# Patient Record
Sex: Female | Born: 1945 | Race: White | Hispanic: No
Health system: Southern US, Community
[De-identification: ages and names within clinical notes are randomized; demographics above are authoritative.]

## PROBLEM LIST (undated history)

## (undated) DIAGNOSIS — M159 Polyosteoarthritis, unspecified: Secondary | ICD-10-CM

## (undated) DIAGNOSIS — D519 Vitamin B12 deficiency anemia, unspecified: Secondary | ICD-10-CM

## (undated) DIAGNOSIS — K219 Gastro-esophageal reflux disease without esophagitis: Secondary | ICD-10-CM

## (undated) DIAGNOSIS — F419 Anxiety disorder, unspecified: Secondary | ICD-10-CM

## (undated) DIAGNOSIS — I4891 Unspecified atrial fibrillation: Secondary | ICD-10-CM

## (undated) DIAGNOSIS — IMO0001 Reserved for inherently not codable concepts without codable children: Secondary | ICD-10-CM

## (undated) DIAGNOSIS — M6281 Muscle weakness (generalized): Secondary | ICD-10-CM

## (undated) DIAGNOSIS — E559 Vitamin D deficiency, unspecified: Secondary | ICD-10-CM

## (undated) DIAGNOSIS — K5090 Crohn's disease, unspecified, without complications: Secondary | ICD-10-CM

## (undated) DIAGNOSIS — I10 Essential (primary) hypertension: Secondary | ICD-10-CM

## (undated) DIAGNOSIS — R296 Repeated falls: Secondary | ICD-10-CM

## (undated) DIAGNOSIS — G4700 Insomnia, unspecified: Secondary | ICD-10-CM

## (undated) DIAGNOSIS — M1990 Unspecified osteoarthritis, unspecified site: Secondary | ICD-10-CM

## (undated) DIAGNOSIS — F329 Major depressive disorder, single episode, unspecified: Secondary | ICD-10-CM

## (undated) DIAGNOSIS — R41841 Cognitive communication deficit: Secondary | ICD-10-CM

## (undated) DIAGNOSIS — D518 Other vitamin B12 deficiency anemias: Secondary | ICD-10-CM

## (undated) DIAGNOSIS — D649 Anemia, unspecified: Secondary | ICD-10-CM

## (undated) DIAGNOSIS — F32A Depression, unspecified: Secondary | ICD-10-CM

## (undated) DIAGNOSIS — R011 Cardiac murmur, unspecified: Secondary | ICD-10-CM

## (undated) DIAGNOSIS — R5382 Chronic fatigue, unspecified: Secondary | ICD-10-CM

## (undated) DIAGNOSIS — F0390 Unspecified dementia without behavioral disturbance: Secondary | ICD-10-CM

## (undated) HISTORY — DX: Anxiety disorder, unspecified: F41.9

## (undated) HISTORY — DX: Cardiac murmur, unspecified: R01.1

## (undated) HISTORY — DX: Gastro-esophageal reflux disease without esophagitis: K21.9

## (undated) HISTORY — PX: COLON SURGERY: SHX602

## (undated) HISTORY — DX: Anemia, unspecified: D64.9

## (undated) HISTORY — DX: Crohn's disease, unspecified, without complications: K50.90

## (undated) HISTORY — DX: Unspecified osteoarthritis, unspecified site: M19.90

## (undated) HISTORY — DX: Essential (primary) hypertension: I10

## (undated) HISTORY — PX: CHOLECYSTECTOMY: SHX55

## (undated) HISTORY — PX: CARPAL TUNNEL RELEASE: SHX101

## (undated) HISTORY — DX: Major depressive disorder, single episode, unspecified: F32.9

## (undated) HISTORY — DX: Reserved for inherently not codable concepts without codable children: IMO0001

## (undated) HISTORY — PX: ANKLE SURGERY: SHX546

## (undated) HISTORY — DX: Depression, unspecified: F32.A

---

## 1998-12-18 ENCOUNTER — Ambulatory Visit (HOSPITAL_COMMUNITY): Admission: RE | Admit: 1998-12-18 | Discharge: 1998-12-18 | Payer: Self-pay

## 2000-12-12 ENCOUNTER — Encounter: Admission: RE | Admit: 2000-12-12 | Discharge: 2000-12-12 | Payer: Self-pay | Admitting: *Deleted

## 2000-12-12 ENCOUNTER — Encounter: Payer: Self-pay | Admitting: *Deleted

## 2003-03-04 ENCOUNTER — Encounter: Admission: RE | Admit: 2003-03-04 | Discharge: 2003-03-04 | Payer: Self-pay | Admitting: Family Medicine

## 2003-07-31 ENCOUNTER — Encounter (INDEPENDENT_AMBULATORY_CARE_PROVIDER_SITE_OTHER): Payer: Self-pay | Admitting: *Deleted

## 2003-07-31 ENCOUNTER — Ambulatory Visit (HOSPITAL_COMMUNITY): Admission: RE | Admit: 2003-07-31 | Discharge: 2003-07-31 | Payer: Self-pay | Admitting: Gastroenterology

## 2003-09-17 ENCOUNTER — Ambulatory Visit (HOSPITAL_COMMUNITY): Admission: RE | Admit: 2003-09-17 | Discharge: 2003-09-17 | Payer: Self-pay | Admitting: Family Medicine

## 2003-09-17 IMAGING — CR DG TOE 2ND 2+V*R*
1 series · 1 of 1 positions shown · non-contrast
Comparison: none

CLINICAL DATA: Second toe pain after injury. 
 RIGHT SECOND TOE 
 I question if there is a minimal fracture at the base of the proximal phalanx.  No angulation or displacement. 
 IMPRESSION
 1.  Suspected fracture of the proximal aspect of the proximal phalanx without angulation or displacement.

[view not recorded]
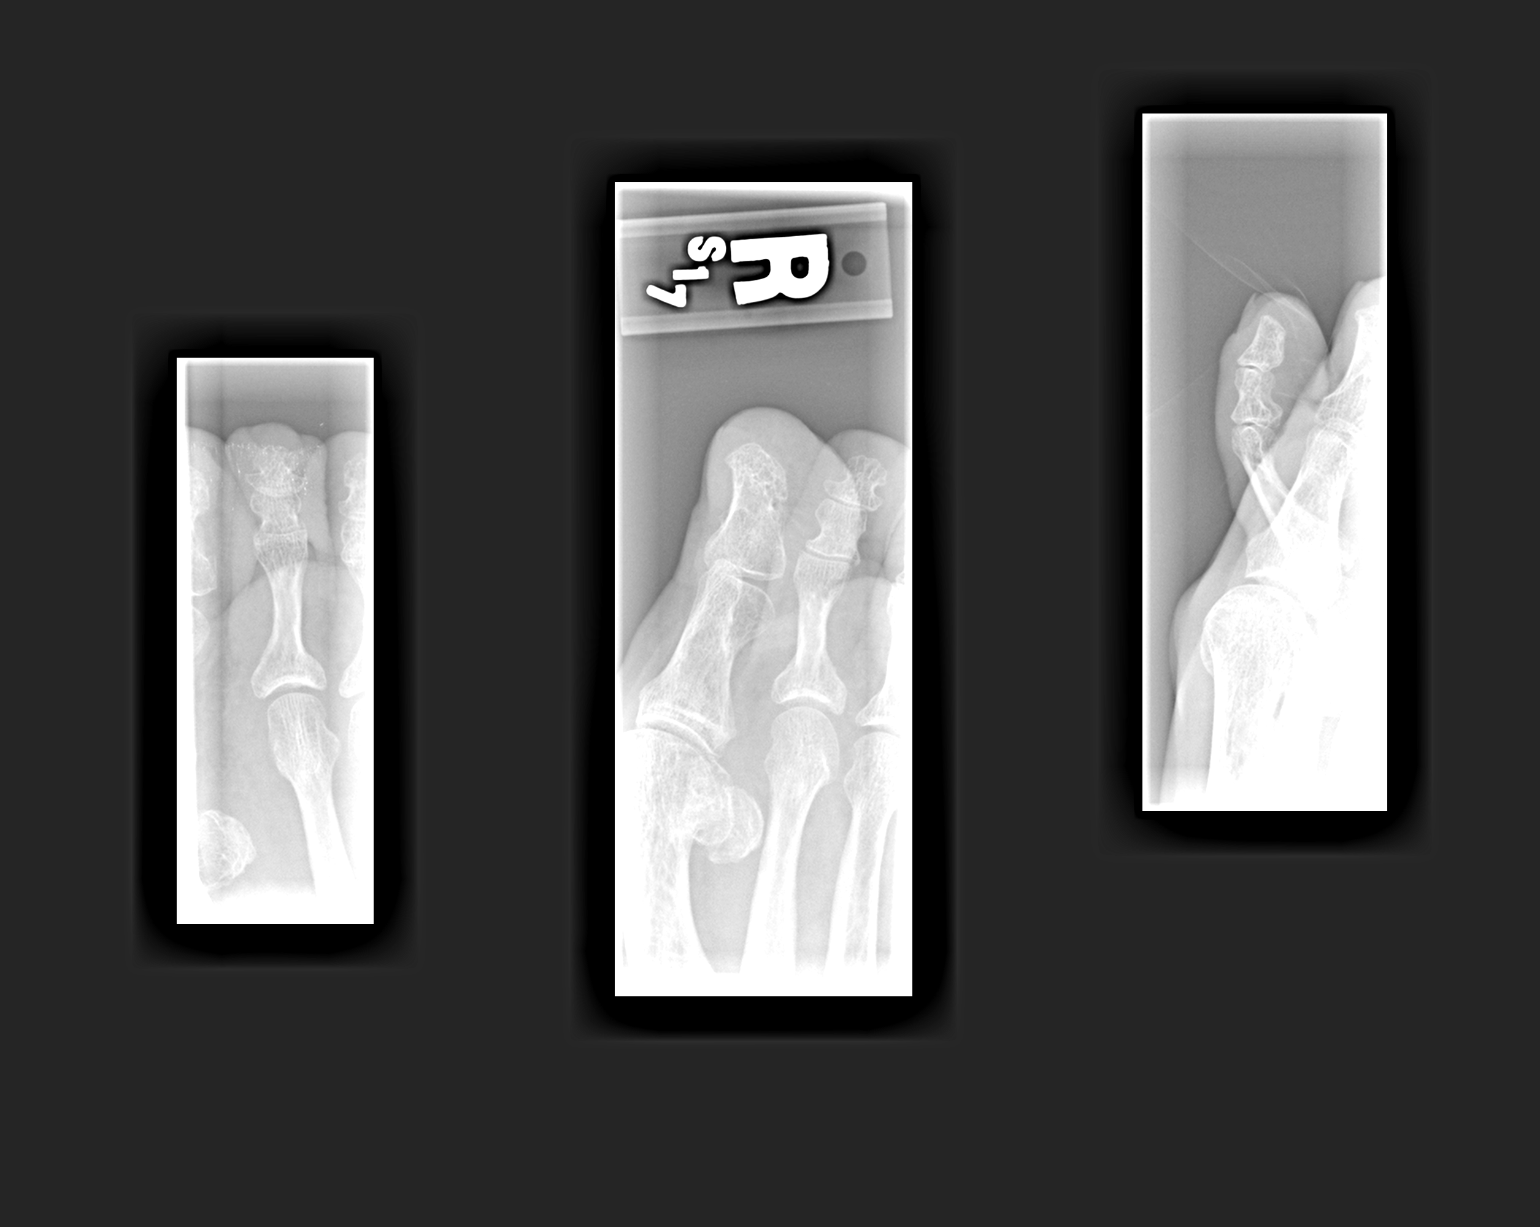

[1 of 1 positions shown; findings below may reference images not displayed]

## 2008-06-04 ENCOUNTER — Emergency Department (HOSPITAL_COMMUNITY): Admission: EM | Admit: 2008-06-04 | Discharge: 2008-06-04 | Payer: Self-pay | Admitting: Emergency Medicine

## 2010-06-11 NOTE — Op Note (Signed)
NAMECHARLIZE, Kristen Gray                          ACCOUNT NO.:  [id number]   MEDICAL RECORD NO.:  [id number]                   PATIENT TYPE:  AMB   LOCATION:  ENDO                                 FACILITY:  [facility]   PHYSICIAN:  James L. Rolla Flatten., M.D.          DATE OF BIRTH:  03/25/1945   DATE OF PROCEDURE:  07/31/2003  DATE OF DISCHARGE:                                 OPERATIVE REPORT   PROCEDURE:  Colonoscopy and biopsy.   MEDICATIONS:  Ampicillin 2 g IV at the patient's request for apparent mitral  valve disease, she has been told she needs this. She has not listed any drug  allergies on her allergy sheet in the office. Sedation was achieved with  fentanyl 87.5 mcg and Versed 8 mg IV.   SCOPE:  Olympus pediatric scope.   INDICATIONS FOR PROCEDURE:  The patient's been absent from my office for  three years, has a history of Crohn's disease and has undergone right  hemicolectomy and removal of her terminal ileum.  She has been taking  Azulfidine intermittently, has had worsening diarrhea and for this reason  colonoscopy is performed.   DESCRIPTION OF PROCEDURE:  The procedure had been explained to the patient  and consent obtained. With the patient in the left lateral decubitus  position, the Olympus scope was inserted and advanced. The prep was quite  good.  We were able to easily reach right colon in the area of the  anastomosis, this was clearly seen and was stenotic and ulcerated.  Several  biopsies were obtained of the anastomosis.  The colon mucosa looked normal  other than that.  I attempted to get through but it was somewhat tight and I  was unable to pass the scope through. The remainder of the colon was  endoscopically normal throughout.  Several random biopsies were obtained.  The scope was withdrawn and there was no gross evidence of colitis  throughout the colon. The patient tolerated the procedure well.   ASSESSMENT:  Probable recurrent Crohn's __________  anastomosis, 555.9.   PLAN:  Will go ahead and check path, continue on a low residue diet. Will  change her to Asacol or potassium. See back in the office in one month.                                               James L. Rolla Flatten., M.D.    Jaynie Bream  D:  07/31/2003  T:  07/31/2003  Job:  [id number]   cc:   Antony Contras, M.D.  [facility]  [place], [place] [address]  Fax: [phone]

## 2013-04-19 LAB — LIPID PANEL
Cholesterol: 139 mg/dL (ref 0–200)
HDL: 59 mg/dL (ref 35–70)
LDL Cholesterol: 48 mg/dL
Triglycerides: 158 mg/dL (ref 40–160)

## 2013-04-19 LAB — HEPATIC FUNCTION PANEL
ALT: 7 U/L (ref 7–35)
AST: 15 U/L (ref 13–35)
Bilirubin, Total: 0.4 mg/dL

## 2013-04-19 LAB — HEMOGLOBIN A1C: Hemoglobin A1C: 6.4

## 2013-04-19 LAB — BASIC METABOLIC PANEL
BUN: 19 mg/dL (ref 4–21)
Creatinine: 0.9 mg/dL (ref 0.5–1.1)
Glucose: 112 mg/dL
Potassium: 4.1 mmol/L (ref 3.4–5.3)
Sodium: 136 mmol/L — AB (ref 137–147)

## 2013-10-21 LAB — BASIC METABOLIC PANEL
BUN: 18 mg/dL (ref 4–21)
Creatinine: 1 mg/dL (ref 0.5–1.1)
Glucose: 105 mg/dL
Potassium: 3.9 mmol/L (ref 3.4–5.3)
Sodium: 137 mmol/L (ref 137–147)

## 2013-10-21 LAB — HEMOGLOBIN A1C: Hemoglobin A1C: 4.4

## 2014-04-22 LAB — LIPID PANEL
Cholesterol: 149 mg/dL (ref 0–200)
HDL: 58 mg/dL (ref 35–70)
LDL Cholesterol: 64 mg/dL
Triglycerides: 137 mg/dL (ref 40–160)

## 2014-04-22 LAB — BASIC METABOLIC PANEL
BUN: 21 mg/dL (ref 4–21)
Creatinine: 1.1 mg/dL (ref 0.5–1.1)
Glucose: 108 mg/dL
Potassium: 3.9 mmol/L (ref 3.4–5.3)
Sodium: 138 mmol/L (ref 137–147)

## 2014-04-22 LAB — HEPATIC FUNCTION PANEL
ALT: 5 U/L — AB (ref 7–35)
AST: 15 U/L (ref 13–35)
Bilirubin, Total: 0.6 mg/dL

## 2015-03-27 DIAGNOSIS — R0609 Other forms of dyspnea: Secondary | ICD-10-CM | POA: Diagnosis not present

## 2015-03-27 DIAGNOSIS — R7303 Prediabetes: Secondary | ICD-10-CM | POA: Diagnosis not present

## 2015-03-27 DIAGNOSIS — Z23 Encounter for immunization: Secondary | ICD-10-CM | POA: Diagnosis not present

## 2015-03-27 DIAGNOSIS — M65842 Other synovitis and tenosynovitis, left hand: Secondary | ICD-10-CM | POA: Diagnosis not present

## 2015-03-27 DIAGNOSIS — M1990 Unspecified osteoarthritis, unspecified site: Secondary | ICD-10-CM | POA: Diagnosis not present

## 2015-03-27 DIAGNOSIS — I10 Essential (primary) hypertension: Secondary | ICD-10-CM | POA: Diagnosis not present

## 2015-03-27 DIAGNOSIS — F419 Anxiety disorder, unspecified: Secondary | ICD-10-CM | POA: Diagnosis not present

## 2015-03-27 DIAGNOSIS — F329 Major depressive disorder, single episode, unspecified: Secondary | ICD-10-CM | POA: Diagnosis not present

## 2015-03-27 DIAGNOSIS — M8580 Other specified disorders of bone density and structure, unspecified site: Secondary | ICD-10-CM | POA: Diagnosis not present

## 2015-03-27 DIAGNOSIS — E559 Vitamin D deficiency, unspecified: Secondary | ICD-10-CM | POA: Diagnosis not present

## 2015-03-27 DIAGNOSIS — K5090 Crohn's disease, unspecified, without complications: Secondary | ICD-10-CM | POA: Diagnosis not present

## 2015-03-27 DIAGNOSIS — Z0000 Encounter for general adult medical examination without abnormal findings: Secondary | ICD-10-CM | POA: Diagnosis not present

## 2015-03-28 LAB — BASIC METABOLIC PANEL
BUN: 16 mg/dL (ref 4–21)
Creatinine: 0.9 mg/dL (ref 0.5–1.1)
Glucose: 105 mg/dL
Potassium: 4.2 mmol/L (ref 3.4–5.3)
Sodium: 138 mmol/L (ref 137–147)

## 2015-03-28 LAB — LIPID PANEL
Cholesterol: 144 mg/dL (ref 0–200)
HDL: 61 mg/dL (ref 35–70)
LDL Cholesterol: 51 mg/dL
Triglycerides: 159 mg/dL (ref 40–160)

## 2015-03-28 LAB — CBC AND DIFFERENTIAL
HCT: 36 % (ref 36–46)
Hemoglobin: 11.8 g/dL — AB (ref 12.0–16.0)
Platelets: 199 10*3/uL (ref 150–399)
WBC: 7 10^3/mL

## 2015-03-28 LAB — HEPATIC FUNCTION PANEL
ALT: 7 U/L (ref 7–35)
AST: 18 U/L (ref 13–35)

## 2015-03-28 LAB — TSH: TSH: 2.61 u[IU]/mL (ref 0.41–5.90)

## 2015-04-14 DIAGNOSIS — R079 Chest pain, unspecified: Secondary | ICD-10-CM | POA: Diagnosis not present

## 2015-04-14 DIAGNOSIS — R0789 Other chest pain: Secondary | ICD-10-CM | POA: Diagnosis not present

## 2015-04-14 DIAGNOSIS — I10 Essential (primary) hypertension: Secondary | ICD-10-CM | POA: Diagnosis not present

## 2015-04-14 DIAGNOSIS — E668 Other obesity: Secondary | ICD-10-CM | POA: Diagnosis not present

## 2015-04-14 DIAGNOSIS — R0602 Shortness of breath: Secondary | ICD-10-CM | POA: Diagnosis not present

## 2015-04-14 DIAGNOSIS — E785 Hyperlipidemia, unspecified: Secondary | ICD-10-CM | POA: Diagnosis not present

## 2015-04-24 ENCOUNTER — Other Ambulatory Visit: Payer: Self-pay | Admitting: Cardiology

## 2015-04-24 ENCOUNTER — Ambulatory Visit
Admission: RE | Admit: 2015-04-24 | Discharge: 2015-04-24 | Disposition: A | Discharge status: Home / Self Care | Payer: PPO | Source: Ambulatory Visit | Attending: Cardiology | Admitting: Cardiology

## 2015-04-24 DIAGNOSIS — R0602 Shortness of breath: Secondary | ICD-10-CM

## 2015-04-24 IMAGING — CR DG CHEST 2V
2 series · 2 of 2 positions shown · non-contrast
Comparison: None.

CLINICAL DATA: Shortness of breath with exertion, wheezing

EXAM:
CHEST  2 VIEW

[w chest pa]
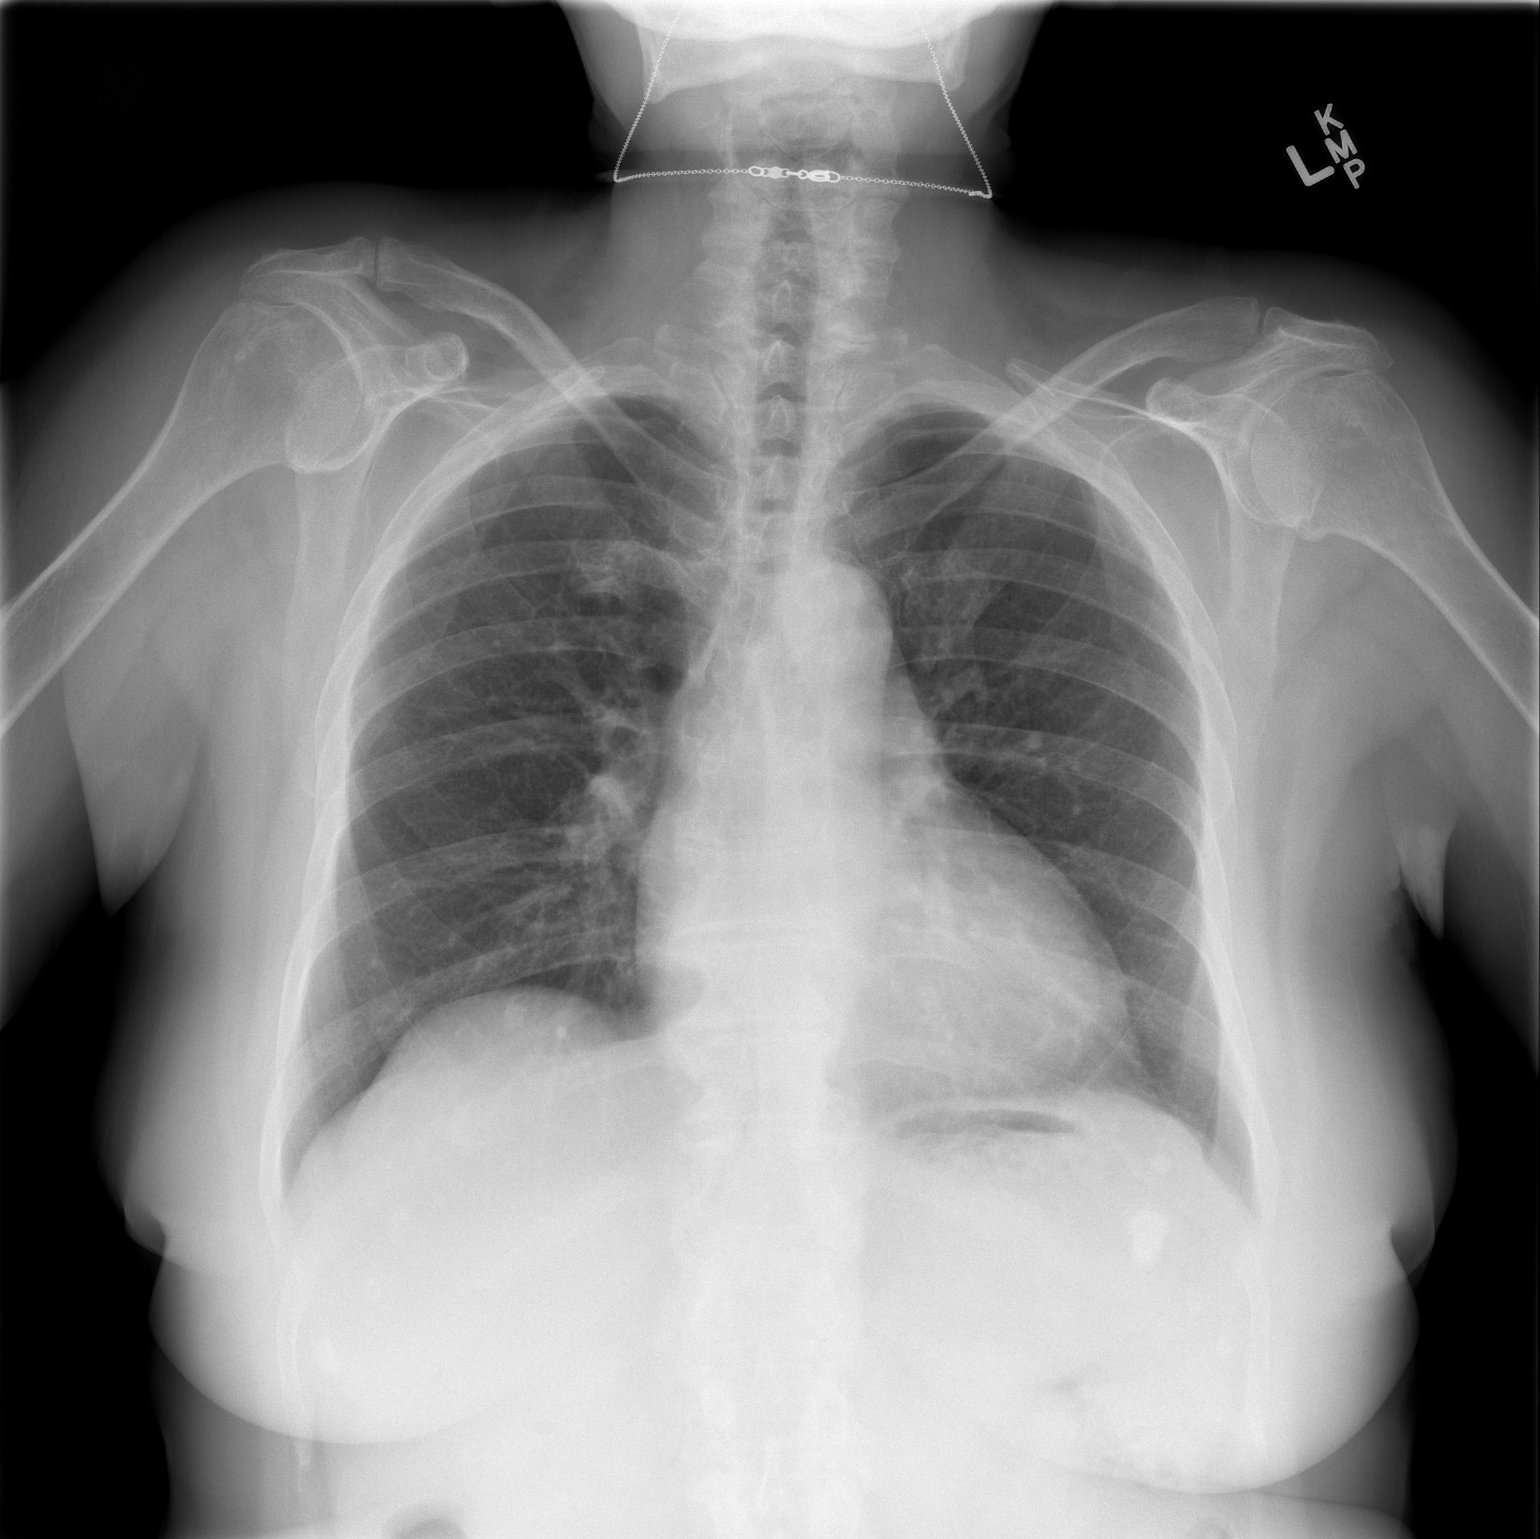

[w chest lat]
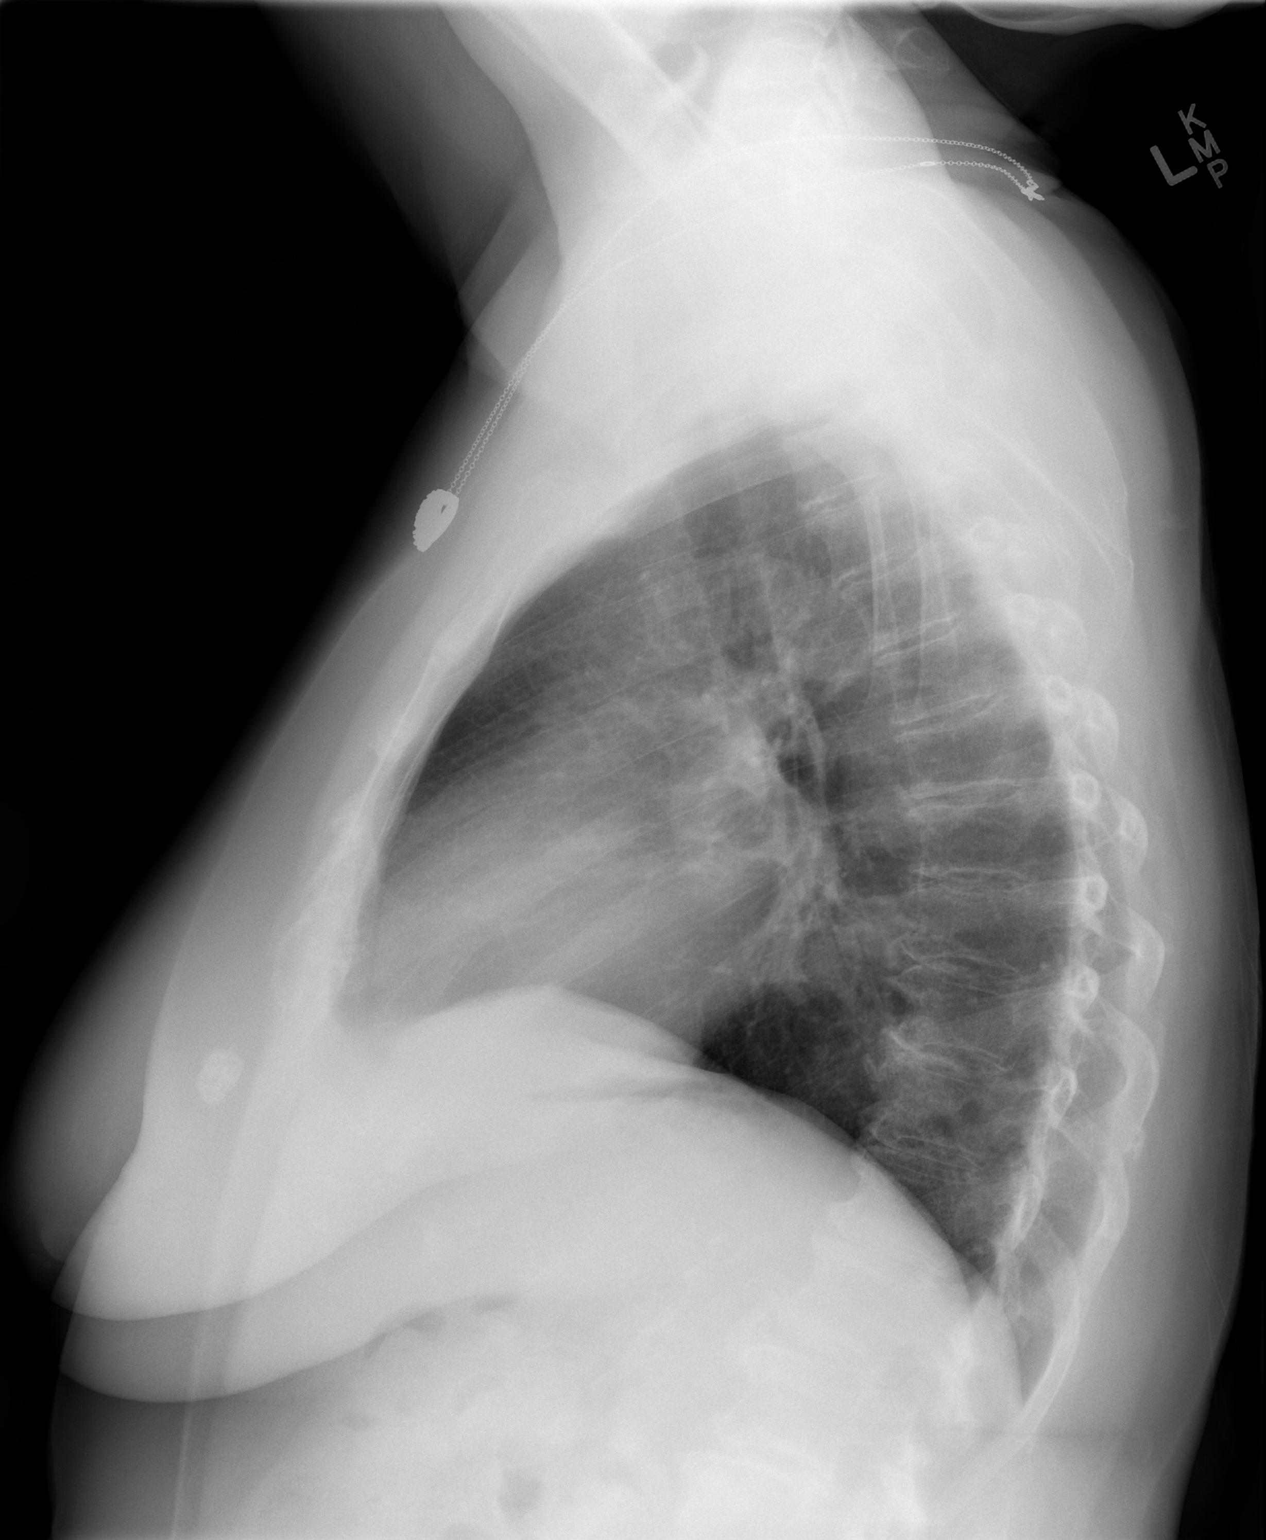

[2 of 2 positions shown; findings below may reference images not displayed]

FINDINGS: Lungs are clear.  No pleural effusion or pneumothorax.

The heart is top-normal size.

Degenerative changes of the visualized thoracolumbar spine.
IMPRESSION: No evidence of acute cardiopulmonary disease.

## 2015-04-27 DIAGNOSIS — R0609 Other forms of dyspnea: Secondary | ICD-10-CM | POA: Diagnosis not present

## 2015-04-29 DIAGNOSIS — M25561 Pain in right knee: Secondary | ICD-10-CM | POA: Diagnosis not present

## 2015-04-29 DIAGNOSIS — M65312 Trigger thumb, left thumb: Secondary | ICD-10-CM | POA: Diagnosis not present

## 2015-04-30 DIAGNOSIS — M859 Disorder of bone density and structure, unspecified: Secondary | ICD-10-CM | POA: Diagnosis not present

## 2015-04-30 DIAGNOSIS — Z780 Asymptomatic menopausal state: Secondary | ICD-10-CM | POA: Diagnosis not present

## 2015-05-01 DIAGNOSIS — K219 Gastro-esophageal reflux disease without esophagitis: Secondary | ICD-10-CM | POA: Diagnosis not present

## 2015-05-01 DIAGNOSIS — K5090 Crohn's disease, unspecified, without complications: Secondary | ICD-10-CM | POA: Diagnosis not present

## 2015-05-01 DIAGNOSIS — R079 Chest pain, unspecified: Secondary | ICD-10-CM | POA: Diagnosis not present

## 2015-05-08 DIAGNOSIS — R0609 Other forms of dyspnea: Secondary | ICD-10-CM | POA: Diagnosis not present

## 2015-05-08 DIAGNOSIS — R0789 Other chest pain: Secondary | ICD-10-CM | POA: Diagnosis not present

## 2015-05-08 DIAGNOSIS — R0602 Shortness of breath: Secondary | ICD-10-CM | POA: Diagnosis not present

## 2015-05-08 DIAGNOSIS — I119 Hypertensive heart disease without heart failure: Secondary | ICD-10-CM | POA: Diagnosis not present

## 2015-05-08 DIAGNOSIS — I10 Essential (primary) hypertension: Secondary | ICD-10-CM | POA: Diagnosis not present

## 2015-05-08 DIAGNOSIS — E668 Other obesity: Secondary | ICD-10-CM | POA: Diagnosis not present

## 2015-05-08 DIAGNOSIS — I359 Nonrheumatic aortic valve disorder, unspecified: Secondary | ICD-10-CM | POA: Diagnosis not present

## 2015-05-08 DIAGNOSIS — E785 Hyperlipidemia, unspecified: Secondary | ICD-10-CM | POA: Diagnosis not present

## 2015-06-30 DIAGNOSIS — K219 Gastro-esophageal reflux disease without esophagitis: Secondary | ICD-10-CM | POA: Diagnosis not present

## 2015-06-30 DIAGNOSIS — K5090 Crohn's disease, unspecified, without complications: Secondary | ICD-10-CM | POA: Diagnosis not present

## 2015-09-04 DIAGNOSIS — I10 Essential (primary) hypertension: Secondary | ICD-10-CM | POA: Diagnosis not present

## 2015-09-04 DIAGNOSIS — K5090 Crohn's disease, unspecified, without complications: Secondary | ICD-10-CM | POA: Diagnosis not present

## 2015-09-04 DIAGNOSIS — G4700 Insomnia, unspecified: Secondary | ICD-10-CM | POA: Diagnosis not present

## 2015-09-04 DIAGNOSIS — I351 Nonrheumatic aortic (valve) insufficiency: Secondary | ICD-10-CM | POA: Diagnosis not present

## 2015-09-04 DIAGNOSIS — M1990 Unspecified osteoarthritis, unspecified site: Secondary | ICD-10-CM | POA: Diagnosis not present

## 2015-09-04 DIAGNOSIS — R7301 Impaired fasting glucose: Secondary | ICD-10-CM | POA: Diagnosis not present

## 2015-09-04 DIAGNOSIS — E559 Vitamin D deficiency, unspecified: Secondary | ICD-10-CM | POA: Diagnosis not present

## 2015-09-04 DIAGNOSIS — F329 Major depressive disorder, single episode, unspecified: Secondary | ICD-10-CM | POA: Diagnosis not present

## 2015-09-04 DIAGNOSIS — M2041 Other hammer toe(s) (acquired), right foot: Secondary | ICD-10-CM | POA: Diagnosis not present

## 2015-09-04 DIAGNOSIS — M8580 Other specified disorders of bone density and structure, unspecified site: Secondary | ICD-10-CM | POA: Diagnosis not present

## 2015-09-04 DIAGNOSIS — F419 Anxiety disorder, unspecified: Secondary | ICD-10-CM | POA: Diagnosis not present

## 2015-09-04 LAB — CBC AND DIFFERENTIAL
HCT: 39 % (ref 36–46)
Hemoglobin: 12.5 g/dL (ref 12.0–16.0)
Platelets: 221 10*3/uL (ref 150–399)
WBC: 7.2 10^3/mL

## 2015-09-04 LAB — TSH: TSH: 0.98 u[IU]/mL (ref 0.41–5.90)

## 2015-09-04 LAB — HEPATIC FUNCTION PANEL
ALT: 6 U/L — AB (ref 7–35)
AST: 16 U/L (ref 13–35)

## 2015-09-04 LAB — BASIC METABOLIC PANEL
BUN: 17 mg/dL (ref 4–21)
Creatinine: 1 mg/dL (ref 0.5–1.1)
Glucose: 138 mg/dL
Potassium: 4.1 mmol/L (ref 3.4–5.3)
Sodium: 138 mmol/L (ref 137–147)

## 2015-09-04 LAB — HEMOGLOBIN A1C: Hemoglobin A1C: 4.9

## 2015-10-07 ENCOUNTER — Encounter (INDEPENDENT_AMBULATORY_CARE_PROVIDER_SITE_OTHER): Payer: Self-pay

## 2015-10-07 ENCOUNTER — Ambulatory Visit (INDEPENDENT_AMBULATORY_CARE_PROVIDER_SITE_OTHER)
Admission: RE | Admit: 2015-10-07 | Discharge: 2015-10-07 | Disposition: A | Discharge status: Home / Self Care | Payer: PPO | Source: Ambulatory Visit | Attending: Internal Medicine | Admitting: Internal Medicine

## 2015-10-07 ENCOUNTER — Encounter: Payer: Self-pay | Admitting: Internal Medicine

## 2015-10-07 ENCOUNTER — Ambulatory Visit (INDEPENDENT_AMBULATORY_CARE_PROVIDER_SITE_OTHER): Payer: PPO | Admitting: Internal Medicine

## 2015-10-07 VITALS — BP 128/74 | HR 95 | Ht <= 58 in | Wt 191.4 lb

## 2015-10-07 DIAGNOSIS — Z23 Encounter for immunization: Secondary | ICD-10-CM

## 2015-10-07 DIAGNOSIS — R0600 Dyspnea, unspecified: Secondary | ICD-10-CM

## 2015-10-07 NOTE — Patient Instructions (Addendum)
Please remember to go to the   x-ray department downstairs for your tests - we will call you with the results when they are available.   We will arrange for full pfts at [facility] and I will read them and call you with results  You need to consider water aerobics to help regain your stamina  Sleep issues don't appear to be due to your breathing but are a concern because if you don't sleep well you will of course feel tired during the day.

## 2015-10-07 NOTE — Progress Notes (Signed)
Subjective:    Patient ID: Kristen Gray, female    DOB: Jan 27, 1945,     MRN: [id number]  HPI   23 yowf grew up in house of smokiers with freq  ear aches, personally never smoked and problems resolved as adult, good ex tolerance but then  steady wt gain since  1976 and arthritis onset around 2000 and doe x fall 2016 and completed neg cards w/u by Wynonia Lawman in April 2017(x mild AR)   And referred to pulmonary clinic 10/07/2015 by Dr   Moreen Fowler for unexplained sob.   10/07/2015 1st [facility] office visit/ Kristen Gray   Chief Complaint  Patient presents with  . pulmonary consult    per Dr. Antony Contras. pt c/o sob w/exertion & occ chest tightness with exertion X1y  onset was insidious / pattern is minimally progressive x one year /does not do  Steps due to knees and now sob room to room at home or mb and back "100 steps" from house"  No resting sob/ no noct symptoms assoc with 20 lb wt gain since onset of doe which is assoc with sense of pnds   No obvious  day to day or daytime variabilty or assoc chronic cough or cp or  subjective wheeze overt   hb symptoms. No unusual exp hx or h/o childhood pna/ asthma or knowledge of premature birth.  Sleeping is restless without nocturnal  or early am exacerbation  of respiratory  c/o's or need for noct saba or tendency to hypersomnolence . Also denies any obvious fluctuation of symptoms with weather or environmental changes or other aggravating or alleviating factors except as outlined above   Current Medications, Allergies, Complete Past Medical History, Past Surgical History, Family History, and Social History were reviewed in [employer] record.            Review of Systems  Constitutional: Negative for fever and unexpected weight change.  HENT: Positive for postnasal drip and sneezing. Negative for congestion, dental problem, ear pain, nosebleeds, rhinorrhea, sinus pressure, sore throat and trouble swallowing.   Eyes:  Negative for redness and itching.  Respiratory: Positive for chest tightness and shortness of breath. Negative for cough and wheezing.   Cardiovascular: Negative for palpitations and leg swelling.  Gastrointestinal: Negative for nausea and vomiting.  Genitourinary: Negative for dysuria.  Musculoskeletal: Negative for joint swelling.  Skin: Negative for rash.  Neurological: Negative for headaches.  Hematological: Does not bruise/bleed easily.  Psychiatric/Behavioral: Negative for dysphoric mood. The patient is not nervous/anxious.        Objective:   Physical Exam  Hoarse very pale  amb wf nad  Wt Readings from Last 3 Encounters:  10/07/15 191 lb 6.4 oz (86.8 kg)    Vital signs reviewed - note sats 95% RA on arrival    HEENT: nl dentition, turbinates, and oropharynx which is pristine. Nl external ear canals without cough reflex   NECK :  without JVD/Nodes/TM/ nl carotid upstrokes bilaterally   LUNGS: no acc muscle use,  Nl contour chest which is clear to A and P bilaterally without cough on insp or exp maneuvers   CV:  RRR  no s3 or murmur or increase in P2, no edema   ABD:  Obese soft and nontender with nl inspiratory excursion in the supine position. No bruits or organomegaly, bowel sounds nl  MS:  Nl gait/ ext warm without deformities, calf tenderness, cyanosis or clubbing No obvious joint restrictions   SKIN: warm and  dry without lesions    NEURO:  alert, approp, nl sensorium with  no motor deficits     CXR PA and Lateral:   10/07/2015 :    I personally reviewed images and agree with radiology impression as follows:   Lungs are adequately inflated without consolidation or effusion. There is mild stable cardiomegaly. Mild degenerate change of the spine.    Labs 09/04/15  Cbc with diff/tsh/hc02 all nl         Assessment & Plan:

## 2015-10-08 ENCOUNTER — Encounter: Payer: Self-pay | Admitting: Internal Medicine

## 2015-10-08 NOTE — Assessment & Plan Note (Addendum)
Neg cards w/u April 2017 10/07/2015   Walked RA  2 laps @ 185 ft each stopped due to  Fatigue/ knee pain > sob with sats 99%   Unable to reproduce this daily chronic complaint here in the office and pfts need to be done to complete the w/u but if they are just restrictive as I suspect they will be this is likely all related to obesity/ deconditioning and best approached with regular ex - since can't do aerobics best bet is water aerobics  Total time devoted to counseling  = 35/23mreview case with pt/ discussion of options/alternatives/ personally creating written instructions  in presence of pt  then going over those specific  Instructions directly with the pt including how to use all of the meds but in particular covering each new medication in detail and the difference between the maintenance/automatic meds and the prns using an action plan format for the latter.

## 2015-10-08 NOTE — Progress Notes (Signed)
Spoke with pt and notified of results per Dr. Wert. Pt verbalized understanding and denied any questions. 

## 2015-10-15 ENCOUNTER — Ambulatory Visit (HOSPITAL_COMMUNITY)
Admission: RE | Admit: 2015-10-15 | Discharge: 2015-10-15 | Disposition: A | Discharge status: Home / Self Care | Payer: PPO | Source: Ambulatory Visit | Attending: Internal Medicine | Admitting: Internal Medicine

## 2015-10-15 DIAGNOSIS — R942 Abnormal results of pulmonary function studies: Secondary | ICD-10-CM | POA: Diagnosis not present

## 2015-10-15 DIAGNOSIS — J449 Chronic obstructive pulmonary disease, unspecified: Secondary | ICD-10-CM | POA: Diagnosis not present

## 2015-10-15 DIAGNOSIS — R0600 Dyspnea, unspecified: Secondary | ICD-10-CM | POA: Diagnosis not present

## 2015-10-15 LAB — PULMONARY FUNCTION TEST
DL/VA % pred: 107 %
DL/VA: 4.2 ml/min/mmHg/L
DLCO unc % pred: 79 %
DLCO unc: 12.81 ml/min/mmHg
FEF 25-75 Post: 1.39 L/sec
FEF 25-75 Pre: 1.02 L/sec
FEF2575-%Change-Post: 36 %
FEF2575-%Pred-Post: 94 %
FEF2575-%Pred-Pre: 69 %
FEV1-%Change-Post: 4 %
FEV1-%Pred-Post: 83 %
FEV1-%Pred-Pre: 79 %
FEV1-Post: 1.39 L
FEV1-Pre: 1.32 L
FEV1FVC-%Change-Post: -3 %
FEV1FVC-%Pred-Pre: 101 %
FEV6-%Change-Post: 7 %
FEV6-%Pred-Post: 89 %
FEV6-%Pred-Pre: 82 %
FEV6-Post: 1.87 L
FEV6-Pre: 1.74 L
FEV6FVC-%Change-Post: 0 %
FEV6FVC-%Pred-Post: 104 %
FEV6FVC-%Pred-Pre: 104 %
FVC-%Change-Post: 8 %
FVC-%Pred-Post: 85 %
FVC-%Pred-Pre: 78 %
FVC-Post: 1.89 L
FVC-Pre: 1.75 L
Post FEV1/FVC ratio: 73 %
Post FEV6/FVC ratio: 99 %
Pre FEV1/FVC ratio: 76 %
Pre FEV6/FVC Ratio: 100 %
RV % pred: 112 %
RV: 2.17 L
TLC % pred: 97 %
TLC: 4.06 L

## 2015-10-15 MED ORDER — ALBUTEROL SULFATE (2.5 MG/3ML) 0.083% IN NEBU
2.5000 mg | INHALATION_SOLUTION | Freq: Once | RESPIRATORY_TRACT | Status: AC
  Administered 2015-10-15: 2.5 mg via RESPIRATORY_TRACT

## 2015-10-19 NOTE — Progress Notes (Signed)
LMTCB

## 2015-10-21 NOTE — Progress Notes (Signed)
lmtcb

## 2015-10-22 DIAGNOSIS — M25551 Pain in right hip: Secondary | ICD-10-CM | POA: Diagnosis not present

## 2015-10-22 DIAGNOSIS — M2041 Other hammer toe(s) (acquired), right foot: Secondary | ICD-10-CM | POA: Diagnosis not present

## 2015-10-22 DIAGNOSIS — M1711 Unilateral primary osteoarthritis, right knee: Secondary | ICD-10-CM | POA: Diagnosis not present

## 2015-10-23 ENCOUNTER — Telehealth: Payer: Self-pay | Admitting: Internal Medicine

## 2015-10-23 NOTE — Telephone Encounter (Signed)
Notes Recorded by Rosana Berger, CMA on 10/21/2015 at 11:43 AM EDT lmtcb ------  Notes Recorded by Rosana Berger, CMA on 10/19/2015 at 5:21 PM EDT [facility] ------  Notes Recorded by Tanda Rockers, MD on 10/18/2015 at 7:56 AM EDT Call patient : Study is unremarkable x for effects of wt -----  lmtcb x1

## 2015-10-26 NOTE — Telephone Encounter (Signed)
Spoke with pt. She is aware of results. A copy will be placed in the mail to her today. Nothing further was needed.

## 2015-11-19 DIAGNOSIS — M1711 Unilateral primary osteoarthritis, right knee: Secondary | ICD-10-CM | POA: Diagnosis not present

## 2015-12-07 DIAGNOSIS — K5090 Crohn's disease, unspecified, without complications: Secondary | ICD-10-CM | POA: Diagnosis not present

## 2015-12-07 DIAGNOSIS — R197 Diarrhea, unspecified: Secondary | ICD-10-CM | POA: Diagnosis not present

## 2015-12-07 DIAGNOSIS — R159 Full incontinence of feces: Secondary | ICD-10-CM | POA: Diagnosis not present

## 2015-12-25 DIAGNOSIS — M25511 Pain in right shoulder: Secondary | ICD-10-CM | POA: Diagnosis not present

## 2015-12-31 DIAGNOSIS — R197 Diarrhea, unspecified: Secondary | ICD-10-CM | POA: Diagnosis not present

## 2016-01-07 DIAGNOSIS — M1711 Unilateral primary osteoarthritis, right knee: Secondary | ICD-10-CM | POA: Diagnosis not present

## 2016-01-07 DIAGNOSIS — M7541 Impingement syndrome of right shoulder: Secondary | ICD-10-CM | POA: Diagnosis not present

## 2016-01-14 DIAGNOSIS — M1711 Unilateral primary osteoarthritis, right knee: Secondary | ICD-10-CM | POA: Diagnosis not present

## 2016-01-20 DIAGNOSIS — M1711 Unilateral primary osteoarthritis, right knee: Secondary | ICD-10-CM | POA: Diagnosis not present

## 2016-03-18 DIAGNOSIS — H1713 Central corneal opacity, bilateral: Secondary | ICD-10-CM | POA: Diagnosis not present

## 2016-03-18 DIAGNOSIS — H5202 Hypermetropia, left eye: Secondary | ICD-10-CM | POA: Diagnosis not present

## 2016-03-18 DIAGNOSIS — H5211 Myopia, right eye: Secondary | ICD-10-CM | POA: Diagnosis not present

## 2016-03-18 DIAGNOSIS — H04123 Dry eye syndrome of bilateral lacrimal glands: Secondary | ICD-10-CM | POA: Diagnosis not present

## 2016-03-18 DIAGNOSIS — H52223 Regular astigmatism, bilateral: Secondary | ICD-10-CM | POA: Diagnosis not present

## 2016-04-12 ENCOUNTER — Telehealth: Payer: Self-pay | Admitting: Family Medicine

## 2016-04-13 ENCOUNTER — Encounter: Payer: Self-pay | Admitting: Family Medicine

## 2016-04-13 DIAGNOSIS — G4700 Insomnia, unspecified: Secondary | ICD-10-CM | POA: Insufficient documentation

## 2016-04-13 DIAGNOSIS — E559 Vitamin D deficiency, unspecified: Secondary | ICD-10-CM | POA: Insufficient documentation

## 2016-04-13 DIAGNOSIS — M159 Polyosteoarthritis, unspecified: Secondary | ICD-10-CM | POA: Insufficient documentation

## 2016-04-13 DIAGNOSIS — I10 Essential (primary) hypertension: Secondary | ICD-10-CM | POA: Insufficient documentation

## 2016-04-13 DIAGNOSIS — F411 Generalized anxiety disorder: Secondary | ICD-10-CM | POA: Insufficient documentation

## 2016-04-13 DIAGNOSIS — F329 Major depressive disorder, single episode, unspecified: Secondary | ICD-10-CM | POA: Insufficient documentation

## 2016-04-14 ENCOUNTER — Encounter: Payer: Self-pay | Admitting: Family Medicine

## 2016-04-14 ENCOUNTER — Ambulatory Visit (INDEPENDENT_AMBULATORY_CARE_PROVIDER_SITE_OTHER): Payer: PPO | Admitting: Family Medicine

## 2016-04-14 VITALS — BP 140/82 | HR 96 | Resp 12 | Ht <= 58 in | Wt 194.2 lb

## 2016-04-14 DIAGNOSIS — F331 Major depressive disorder, recurrent, moderate: Secondary | ICD-10-CM | POA: Diagnosis not present

## 2016-04-14 DIAGNOSIS — G4700 Insomnia, unspecified: Secondary | ICD-10-CM | POA: Diagnosis not present

## 2016-04-14 DIAGNOSIS — Z6841 Body Mass Index (BMI) 40.0 and over, adult: Secondary | ICD-10-CM | POA: Diagnosis not present

## 2016-04-14 DIAGNOSIS — R0600 Dyspnea, unspecified: Secondary | ICD-10-CM

## 2016-04-14 DIAGNOSIS — I10 Essential (primary) hypertension: Secondary | ICD-10-CM | POA: Diagnosis not present

## 2016-04-14 MED ORDER — ALBUTEROL SULFATE HFA 108 (90 BASE) MCG/ACT IN AERS: 2.0000 | INHALATION_SPRAY | Freq: Four times a day (QID) | RESPIRATORY_TRACT | 0 refills | Status: DC | PRN

## 2016-04-14 MED ORDER — DOXEPIN HCL 10 MG PO CAPS: 10.0000 mg | ORAL_CAPSULE | Freq: Every day | ORAL | 1 refills | Status: DC

## 2016-04-14 MED ORDER — BUDESONIDE-FORMOTEROL FUMARATE 80-4.5 MCG/ACT IN AERO: 2.0000 | INHALATION_SPRAY | Freq: Two times a day (BID) | RESPIRATORY_TRACT | 2 refills | Status: DC

## 2016-04-14 NOTE — Patient Instructions (Signed)
A few things to remember from today's visit:   Hypertension, essential  Insomnia, unspecified type  Moderate episode of recurrent major depressive disorder (HCC)  Dyspnea, unspecified type    ? What can I do to sleep better?   Improving your sleep habits is a good start.   Medical or psychiatric conditions might be making your insomnia worse.  Medicine might help, but you shouldn't use sleeping pills long term.   Some people need more sleep than others.   Sleep usually occurs in two- to three-hour cycles, so it is important to get at least three uninterrupted hours of sleep.  The following tips can help you develop better sleep habits: Go to bed and wake up at the same time each day Lie down to sleep only when sleepy. If you can't sleep after 20 minutes, get out of bed and go to another room; return to the bedroom when you are tired; repeat as necessary. Use bedroom for sleep only. Don't do things in bed that might keep you awake, like watching television, reading, talking on the phone, or worrying Avoid caffeine, nicotine, or alcohol for at least four to six hours beforebedtime\ls1Avoid strenuous exercise within four hours of bedtime. Avoid daytime napping. Relax before going to bed. Avoid eating large meals or drinking a lot of water or other liquids in the evening. Keep the bedroom a comfortable temperature. Use earplugs if noise is a problem. Expose yourself to daytime light for at least 30 minutes each morning  Please be sure medication list is accurate. If a new problem present, please set up appointment sooner than planned today.

## 2016-04-14 NOTE — Progress Notes (Signed)
Pre visit review using our clinic review tool, if applicable. No additional management support is needed unless otherwise documented below in the visit note. 

## 2016-04-14 NOTE — Progress Notes (Signed)
HPI:   [name] is a 71 y.o. female, who is here today to establish care with me.  Former PCP: Dr Moreen Fowler at [facility] Last preventive routine visit: 03/2015.  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   Hypertension:   Dx many years ago. Currently on Atenolol-Chlorthalidone 50-25 mg daily.   She is taking medications as instructed, no side effects reported.  She has has not noted unusual headache, visual changes, exertional chest pain, focal weakness, or edema.   Concerns today: SOB and fatigue,both chronic.  She states that her dyspnea is getting worse for the past year. Denies Hx of tobacco use but her parents smoke, so exposed while growing up.  She was evaluated by cardiology Dr Wynonia Lawman, she had nuclear stress test 05/07/15: Low risk,otherwise negative. Also echo 04/27/15: LVEF 50% and moderate aortic valvular abnormalities. According to pt,follow up was not arranged. She has also seen pulmonology. According to pt,she could not do test (?), Dr Melvyn Novas.  She has not tried long term inhalers but remembers using one when she was Dx with acute bronchitis.She also had inhaled medication prior to "breathing test" and felt like she was breathing better for a couple hours. She denies orthopnea or PND.   Fatigue: She "cannot sleep", Hx of insomnia, sleeps about 3 hours,wakes up and cannot go back to sleep. Remeron was recommended, tried up to 2 tabs but did not help and felt "horrible."   She is not sure about sleep apnea but reports loud snoring. Hx of depression and crohn's disease, she follows with GI, she has diarrhea intermittently,symptoms otherwise stable.  Hx of chronic pain, generalized OA.She has a cane.   She is currently on Citalopram 20 mg daily,started about 3-4 years ago. She denies suicidal thoughts but adds that she has had thoughts in the past. She  denies hospitalizations due to psychiatric problems.  Lives alone.  Hx of osteopenia, last DEXA 04/30/15. Fosamax discontinued in 03/2015,completed treatment.  Last labs done 11/2015:  25 OH Vit D 31.3. TSH 0.98. CMP otherwise normal,except for e GFR 54,GLU 138. CBC no significant abnormalities. HgA1C 4.9.    Review of Systems  Constitutional: Positive for fatigue. Negative for activity change, appetite change, fever and unexpected weight change.  HENT: Negative for mouth sores, nosebleeds and trouble swallowing.   Eyes: Negative for redness and visual disturbance.  Respiratory: Positive for shortness of breath. Negative for cough and wheezing.   Cardiovascular: Negative for chest pain, palpitations and leg swelling.  Gastrointestinal: Positive for diarrhea. Negative for abdominal pain, nausea and vomiting.       Negative for changes in bowel habits.  Endocrine: Negative for cold intolerance, heat intolerance, polydipsia, polyphagia and polyuria.  Genitourinary: Negative for decreased urine volume and hematuria.  Musculoskeletal: Positive for arthralgias and gait problem.  Skin: Negative for rash.  Neurological: Negative for syncope, weakness and headaches.  Hematological: Negative for adenopathy. Does not bruise/bleed easily.  Psychiatric/Behavioral: Positive for sleep disturbance. Negative for confusion and suicidal ideas. The patient is nervous/anxious.       Current Outpatient Prescriptions on File Prior to Visit  Medication Sig Dispense Refill  . atenolol-chlorthalidone (TENORETIC) 50-25 MG tablet 1 tablet    . calcium & magnesium carbonates (MYLANTA) 144-818 MG tablet 1000 mg 2 tablets    . Calcium-Vitamin D-Vitamin K (CALCIUM + D) [phone] MG-UNT-MCG CHEW 2 tabs    . citalopram (CELEXA) 20 MG tablet  1 tablet    . folic acid (FOLVITE) 1 MG tablet 1 tablet    . Glucosamine Sulfate-MSM 250-250 MG CAPS     . omeprazole (PRILOSEC OTC) 20 MG tablet 1 tablet    .  sulfaSALAzine (AZULFIDINE) 500 MG tablet TAKE TWO TABLETS BY MOUTH TWICE DAILY     No current facility-administered medications on file prior to visit.      Past Medical History:  Diagnosis Date  . Anemia   . Anxiety   . Arthritis   . Crohn's disease ([place])   . Depression   . GERD (gastroesophageal reflux disease)   . Heart murmur   . Hypertension   . Reflux     Anxiety  F41.9  Active [id number]   Essential (primary) hypertension  I10  Active [id number]   Major depression  F32.9  Active [id number]   Crohn's disease  K50.90  Active 47096283   Osteoarthritis  M19.90  Active 662947654   Osteopenia  M85.80  Active 650354656   Vitamin D deficiency  E55.9  Active 81275170   Moderate aortic regurgitation  I35.1  Active 01749449   Hammertoe of second toe of right foot  M20.41  Active 675916384   Prediabetes  R73.03  Active [id number]   Osteopenia  M85.80  Active 177939030   Insomnia, unspecified type  G47.00  Active 092330076   Gastroesophageal reflux disease, esophagitis presence not specified        Allergies  Allergen Reactions  . Penicillin G Nausea Only    Family History  Problem Relation Age of Onset  . Heart disease Mother     RF  . Stroke Father   . Diabetes Father   . Hyperlipidemia Father   . Hypertension Father     Social History   Social History  . Marital status: Widowed    Spouse name: N/A  . Number of children: N/A  . Years of education: N/A   Social History Main Topics  . Smoking status: Never Smoker  . Smokeless tobacco: Never Used  . Alcohol use No  . Drug use: No  . Sexual activity: Not Currently   Other Topics Concern  . None   Social History Narrative  . None    Vitals:   04/14/16 1357  BP: 140/82  Pulse: 96  Resp: 12   O2 sat at RA 97%. Body mass index is 40.6 kg/m.  Physical Exam  Nursing note and vitals reviewed. Constitutional: She is oriented to person, place, and time. She appears well-developed. No  distress.  HENT:  Head: Atraumatic.  Mouth/Throat: Oropharynx is clear and moist and mucous membranes are normal.  Eyes: Conjunctivae and EOM are normal. Pupils are equal, round, and reactive to light.  Neck: No JVD present.  Cardiovascular: Normal rate and regular rhythm.   Murmur (SEM I/VI RUSB) heard. Pulses:      Dorsalis pedis pulses are 2+ on the right side, and 2+ on the left side.  Respiratory: Effort normal and breath sounds normal. No respiratory distress.  GI: Soft. She exhibits no mass. There is no hepatomegaly. There is no tenderness.  Musculoskeletal: She exhibits no edema.  Antalgic gait.  Lymphadenopathy:    She has no cervical adenopathy.  Neurological: She is alert and oriented to person, place, and time. She has normal strength. Coordination normal.  Stable gait assisted with a cane.  Skin: Skin is warm. No erythema.  Psychiatric: Her mood appears anxious.  Well groomed, good eye contact.  ASSESSMENT AND PLAN:   Odeal was seen today for establish care.  Diagnoses and all orders for this visit:  Dyspnea, unspecified type  We discussed possible etiologies,chronic. ? COPD,deconditioning,obesity among some.  PFT's done 09/2015 otherwise normal, minimal obstructive airway disease. Reporting improvement of symptoms with bronchodilators , so trial of ICS/LABA recommended. She can use Albuterol inh as needed. F/U in 4-8 weeks.  -     budesonide-formoterol (SYMBICORT) 80-4.5 MCG/ACT inhaler; Inhale 2 puffs into the lungs 2 (two) times daily. -     albuterol (PROVENTIL HFA;VENTOLIN HFA) 108 (90 Base) MCG/ACT inhaler; Inhale 2 puffs into the lungs every 6 (six) hours as needed for wheezing or shortness of breath.  Insomnia, unspecified type  Chronic. She agrees with trying Doxepin, some side effects discussed. Good sleep hygiene. Reviewing records she was on Clonazepam in the past,she is not sure when she took medication last. F/U in 1-2 months.  -     doxepin  (SINEQUAN) 10 MG capsule; Take 1 capsule (10 mg total) by mouth at bedtime.  Hypertension, essential  Otherwise adequately controlled. No changes in current management. DASH diet recommended. Periodic eye exam.  F/U in 2 months.  Moderate episode of recurrent major depressive disorder ([place])  Not well controlled but seems stable otherwise. No changes in Citalopram for now. Risk of interaction with Doxepin discussed, so monitor for arrhythmias or worsening SOB. F/U in 2 months , before if needed.   BMI 40.0-44.9, adult ([place])  We discussed benefits of wt loss as well as adverse effects of obesity. Wt loss may even help to better control some of her chronic problems. Consistency with healthy diet and physical activity recommended.Because Hx of OA regular exercise can be challenging,walking in place is a good option.   Records available during OV reviewed and discussed with pt.    FLUZONE MEDICARE IM Intramuscular 2: 00, 15:2 Administered  PREVNAR 13 (PCV13) * IM Intramuscular 8: 00, 15:4 Administered  FLUZONE HIGH DOSE (65 AND OLDER) IM Intramuscular 1: 22, 15:2 Administered  FLUZONE HIGH DOSE (65 AND OLDER) IM Intramuscular 5: 00, 14:2 Administered  PNEUMOVAX (PPV23) IM Intramuscular 8: 00, 16:2 Administered  FLUZONE HIGH DOSE (65 AND OLDER) IM Intramuscular 3: 31, 15:3 Administered  FLUZONE HIGH DOSE (65 AND OLDER) IM Intramuscular 7: 10, 10:3 Administered  ZOSTAVAX (SHINGLES)  Subcutaneous 0: 57, 11:4 Administered  FLUZONE HIGH DOSE (65 AND OLDER) IM Intramuscular 4: 00, 16:2 Administered  FLUVIRIN/INFLENZA (PVT) 2 IM Intramuscular 0: 00, 00:0 Administered  INFLUENZA - FLULAVAL 18 & UP          Pasqualino Witherspoon G. Martinique, MD  [facility]. [place] office.

## 2016-04-15 ENCOUNTER — Telehealth: Payer: Self-pay

## 2016-04-15 DIAGNOSIS — G4700 Insomnia, unspecified: Secondary | ICD-10-CM

## 2016-04-15 NOTE — Telephone Encounter (Signed)
Received fax from [facility] for PA on Doxepin. PA submitted & is pending. Key: [id number]

## 2016-04-16 ENCOUNTER — Encounter: Payer: Self-pay | Admitting: Family Medicine

## 2016-04-18 NOTE — Telephone Encounter (Signed)
Pt states the pharmacy would not fill her doxepin (SINEQUAN) 10 MG capsule  Pt not sure why. Pt states she really needs something to help her sleep. I just saw where there was a PA done. So this is a duplicate. Sorry about that!!

## 2016-04-18 NOTE — Telephone Encounter (Signed)
Insurance has denied the request. Is there something else you want to send in?

## 2016-04-19 NOTE — Telephone Encounter (Signed)
Pharmacy called for pt to follow up on request for doxepin (SINEQUAN) 10 MG capsule

## 2016-04-19 NOTE — Telephone Encounter (Signed)
The prior authorization for the Doxepin was denied. We are waiting to see what Dr. Martinique would like to change it to, she will be back in the office on Thursday.

## 2016-04-19 NOTE — Telephone Encounter (Signed)
I left message for patient to return phone call.

## 2016-04-20 NOTE — Telephone Encounter (Signed)
Trazodone is another option, 25-50 mg at bedtime, this could also interact with Celexa (as many of medications used for insomnia,depression,and anxiety), so she cannot take it together.We could decrease dose of Celexa next OV depending of clinical changes.  I noted she was on Clonazepam 0.5 mg 1/2 tab before. I wonder if she was taking this med for sleep and if she recalls when was the last time she took it. I checked med bottles during visit and she did not have this one.

## 2016-04-20 NOTE — Telephone Encounter (Signed)
Spoke with pt and she does not want to take anything that may interact with her other medications. She would like to pay out of pocket for the Doxepin. She reports that the pharmacy is refusing to dispense the medication to her. Advised her I would contact pharmacy to figure out.  Spoke with pharmacy, [facility], and they state that they will not dispense medication due to possible increased risk of falling. They are refusing to give pt the medication, despite current prescription.   Dr. Martinique - Please advise. Thanks!

## 2016-04-21 MED ORDER — DOXEPIN HCL 10 MG PO CAPS: 10.0000 mg | ORAL_CAPSULE | Freq: Every day | ORAL | 1 refills | Status: DC

## 2016-04-21 NOTE — Telephone Encounter (Signed)
Rx sent to [facility] changed in chart.

## 2016-04-21 NOTE — Telephone Encounter (Signed)
Pt states she would like you to resend the  doxepin (SINEQUAN) 10 MG capsule  To  [facility]  [phone]  Pt may think it is the pharmacy that is not refilling this med. She wants ALL her meds sent to [facility]

## 2016-04-21 NOTE — Addendum Note (Signed)
Addended by: Kateri Mc E on: 04/21/2016 09:29 AM   Modules accepted: Orders

## 2016-05-06 ENCOUNTER — Telehealth: Payer: Self-pay | Admitting: Family Medicine

## 2016-05-06 MED ORDER — CITALOPRAM HYDROBROMIDE 20 MG PO TABS: 20.0000 mg | ORAL_TABLET | Freq: Every day | ORAL | 1 refills | Status: DC

## 2016-05-06 MED ORDER — ATENOLOL-CHLORTHALIDONE 50-25 MG PO TABS: 1.0000 | ORAL_TABLET | Freq: Every day | ORAL | 1 refills | Status: DC

## 2016-05-06 NOTE — Telephone Encounter (Signed)
Rxs sent

## 2016-05-06 NOTE — Telephone Encounter (Signed)
Pharmacy called for pt to request a refill of atenolol-chlorthalidone (TENORETIC) 50-25 MG tablet citalopram (CELEXA) 20 MG tablet  Pt is switching pharmacies and has no active scripts here.  [address],  - [place], [place] - [address]

## 2016-05-16 ENCOUNTER — Encounter: Payer: Self-pay | Admitting: Family Medicine

## 2016-05-16 ENCOUNTER — Ambulatory Visit (INDEPENDENT_AMBULATORY_CARE_PROVIDER_SITE_OTHER): Payer: PPO | Admitting: Family Medicine

## 2016-05-16 VITALS — BP 138/80 | HR 92 | Resp 12 | Ht <= 58 in | Wt 192.1 lb

## 2016-05-16 DIAGNOSIS — R5383 Other fatigue: Secondary | ICD-10-CM

## 2016-05-16 DIAGNOSIS — E559 Vitamin D deficiency, unspecified: Secondary | ICD-10-CM

## 2016-05-16 DIAGNOSIS — G479 Sleep disorder, unspecified: Secondary | ICD-10-CM | POA: Diagnosis not present

## 2016-05-16 DIAGNOSIS — R0600 Dyspnea, unspecified: Secondary | ICD-10-CM

## 2016-05-16 DIAGNOSIS — F331 Major depressive disorder, recurrent, moderate: Secondary | ICD-10-CM

## 2016-05-16 DIAGNOSIS — R0609 Other forms of dyspnea: Secondary | ICD-10-CM | POA: Diagnosis not present

## 2016-05-16 LAB — CBC
HCT: 38.8 % (ref 36.0–46.0)
Hemoglobin: 12.5 g/dL (ref 12.0–15.0)
MCHC: 32.3 g/dL (ref 30.0–36.0)
MCV: 98.7 fl (ref 78.0–100.0)
Platelets: 234 10*3/uL (ref 150.0–400.0)
RBC: 3.93 Mil/uL (ref 3.87–5.11)
RDW: 15 % (ref 11.5–15.5)
WBC: 9.5 10*3/uL (ref 4.0–10.5)

## 2016-05-16 LAB — VITAMIN D 25 HYDROXY (VIT D DEFICIENCY, FRACTURES): VITD: 42.61 ng/mL (ref 30.00–100.00)

## 2016-05-16 LAB — TSH: TSH: 2.78 u[IU]/mL (ref 0.35–4.50)

## 2016-05-16 MED ORDER — VENLAFAXINE HCL ER 75 MG PO CP24: 75.0000 mg | ORAL_CAPSULE | Freq: Every day | ORAL | 2 refills | Status: DC

## 2016-05-16 NOTE — Patient Instructions (Signed)
A few things to remember from today's visit:   Sleep disorder, unspecified - Plan: Ambulatory referral to Pulmonology  Vitamin D deficiency - Plan: VITAMIN D 25 Hydroxy (Vit-D Deficiency, Fractures)  Moderate episode of recurrent major depressive disorder (HCC) - Plan: venlafaxine XR (EFFEXOR XR) 75 MG 24 hr capsule  Fatigue, unspecified type - Plan: Ambulatory referral to Pulmonology, TSH, CBC  Celexa stop, Effexor started.  Please be sure medication list is accurate. If a new problem present, please set up appointment sooner than planned today.

## 2016-05-16 NOTE — Progress Notes (Signed)
HPI:   ACUTE VISIT:  Chief Complaint  Patient presents with  . Fatigue    Kristen Gray is a 71 y.o. female, who is here today complaining of extreme fatigue.  She has a Hx of chronic fatigue but seems like getting worse since 01/2016.  She falls asleep a few times on couch while she is in her house,denies falling asleep while driving. She is requesting labs done,specifically thyroid and CBC, both she has had in the past and in normal range. Denies Hx of OSA.  Feels short of breath and tired when walking about 100 steps,since 2012 but also getting worse.  Albuterol inh helps with symptom but she does not use it consistently. Last OV QVAR recommended, she is using it and has helped with exertional dyspnea but using it as needed. She thinks dust from near by construction is exacerbating symptoms.  Denies associated chest pain, diaphoresis,or palpitations. Cardiac work-up reported done in Summer 2017 and negative.  Hx of HTN, she takes Tenoretic.  Lab Results  Component Value Date   CREATININE 1.0 09/04/2015   BUN 17 09/04/2015   NA 138 09/04/2015   K 4.1 09/04/2015    Also pulmonology evaluation, Dr Melvyn Novas, she is not sure when she is supposed to follow.  Hx of depression and anxiety,not yet well controlled. Last OV , 04/14/16, she reported taking Celexa and other medication ,which name she could not remember. Mirtazapine 15 mg was recommended by former PCP to help her sleep but caused drowsiness next day,so she discontinued. Denies suicidal thoughts. + Anxiety and crying spells.  Insomnia: Trouble falling and staying asleep. Last OV Doxepin 10 mg was recommended,she has tolerated well, has helped her sleep better, 4 hours, but still does not feel rested.   Hx of vit D deficiency, she is on Vit D 1000 U daily.   Review of Systems  Constitutional: Positive for fatigue. Negative for appetite change, fever and unexpected weight change.  HENT: Negative for mouth  sores, nosebleeds and trouble swallowing.   Respiratory: Positive for shortness of breath. Negative for cough and wheezing.   Cardiovascular: Negative for chest pain, palpitations and leg swelling.  Gastrointestinal: Negative for abdominal pain, nausea and vomiting.       Negative for changes in bowel habits.  Endocrine: Negative for cold intolerance and heat intolerance.  Genitourinary: Negative for decreased urine volume, dysuria and hematuria.  Musculoskeletal: Positive for arthralgias and gait problem.  Skin: Negative for pallor and rash.  Allergic/Immunologic: Positive for environmental allergies.  Neurological: Negative for syncope, weakness and headaches.  Hematological: Negative for adenopathy. Does not bruise/bleed easily.  Psychiatric/Behavioral: Positive for sleep disturbance. Negative for confusion, hallucinations and suicidal ideas. The patient is nervous/anxious.       Current Outpatient Prescriptions on File Prior to Visit  Medication Sig Dispense Refill  . albuterol (PROVENTIL HFA;VENTOLIN HFA) 108 (90 Base) MCG/ACT inhaler Inhale 2 puffs into the lungs every 6 (six) hours as needed for wheezing or shortness of breath. 1 Inhaler 0  . atenolol-chlorthalidone (TENORETIC) 50-25 MG tablet Take 1 tablet by mouth daily. 30 tablet 1  . budesonide-formoterol (SYMBICORT) 80-4.5 MCG/ACT inhaler Inhale 2 puffs into the lungs 2 (two) times daily. 1 Inhaler 2  . calcium & magnesium carbonates (MYLANTA) 163-846 MG tablet 1000 mg 2 tablets    . Calcium-Vitamin D-Vitamin K (CALCIUM + D) [phone] MG-UNT-MCG CHEW 2 tabs    . doxepin (SINEQUAN) 10 MG capsule Take 1 capsule (10 mg total) by  mouth at bedtime. 30 capsule 1  . folic acid (FOLVITE) 1 MG tablet 1 tablet    . Glucosamine Sulfate-MSM 250-250 MG CAPS     . omeprazole (PRILOSEC OTC) 20 MG tablet 1 tablet    . sulfaSALAzine (AZULFIDINE) 500 MG tablet TAKE TWO TABLETS BY MOUTH TWICE DAILY     No current facility-administered  medications on file prior to visit.      Past Medical History:  Diagnosis Date  . Anemia   . Anxiety   . Arthritis   . Crohn's disease ([place])   . Depression   . GERD (gastroesophageal reflux disease)   . Heart murmur   . Hypertension   . Reflux    Allergies  Allergen Reactions  . Penicillin G Nausea Only    Social History   Social History  . Marital status: Widowed    Spouse name: N/A  . Number of children: N/A  . Years of education: N/A   Social History Main Topics  . Smoking status: Never Smoker  . Smokeless tobacco: Never Used  . Alcohol use No  . Drug use: No  . Sexual activity: Not Currently   Other Topics Concern  . None   Social History Narrative  . None    Vitals:   05/16/16 1445  BP: 138/80  Pulse: 92  Resp: 12  O2 sat at RA 96% Body mass index is 40.15 kg/m.   Physical Exam  Nursing note and vitals reviewed. Constitutional: She is oriented to person, place, and time. She appears well-developed. No distress.  HENT:  Head: Atraumatic.  Mouth/Throat: Oropharynx is clear and moist and mucous membranes are normal.  Eyes: Conjunctivae and EOM are normal.  Neck: No tracheal deviation present. No thyromegaly present.  Cardiovascular: Normal rate and regular rhythm.   Murmur (SEM I/VI RUSB) heard. Respiratory: Effort normal and breath sounds normal. No respiratory distress.  GI: Soft. She exhibits no mass. There is no hepatomegaly. There is no tenderness.  Musculoskeletal: She exhibits no edema.  Antalgic gait.  Lymphadenopathy:    She has no cervical adenopathy.  Neurological: She is alert and oriented to person, place, and time.  No focal weakness appreciated. Stable gait assisted with a cane.  Skin: Skin is warm. No erythema.  Psychiatric: Her mood appears anxious. Her affect is labile. She expresses no suicidal ideation.  Poorly groomed, good eye contact.    ASSESSMENT AND PLAN:   [name] was seen today for fatigue.  Diagnoses and all  orders for this visit:  Lab Results  Component Value Date   WBC 9.5 05/16/2016   HGB 12.5 05/16/2016   HCT 38.8 05/16/2016   MCV 98.7 05/16/2016   PLT 234.0 05/16/2016   Lab Results  Component Value Date   TSH 2.78 05/16/2016    Fatigue, unspecified type  Chronic. We discussed different possible etiologies including some of her chronic medical problems: Crohn's disease,depression,allergies, insomnia. Also medications and ? OSA. Further recommendations will be given according to lab results.  -     Ambulatory referral to Pulmonology -     TSH -     CBC  Sleep disorder, unspecified  Insomnia: Continue Doxepin 10 mg, side effects discussed. Good sleep hygiene. Hypersomnia: Referral to pulmonologist to discuss sleep study.  -     Ambulatory referral to Pulmonology  Vitamin D deficiency  No changes in current management, will follow labs done today and will give further recommendations accordingly.  -     VITAMIN D  25 Hydroxy (Vit-D Deficiency, Fractures)  Moderate episode of recurrent major depressive disorder ([place])  Not well controlled. After discussion of a few treatment options she agrees with trying Effexor, we can start 75 mg. I recommend stopping Celexa, I am not anticipating problems but if she feels like having worsening anxiety or depression w/o suicidal thoughts she can resume Celexa at 10 mg instead 40 mg.  -     venlafaxine XR (EFFEXOR XR) 75 MG 24 hr capsule; Take 1 capsule (75 mg total) by mouth daily with breakfast.  Exertional dyspnea  COPD/asthma vs deconditioning. Albuterol inh before moderate physical activity. QVAR 80-4.5 mcg bid.  Per records reviewed: Echo 04/27/15 mild to moderate valvular abnormalities, LVEF 50%. Nuclear stress test 05/07/15 lower risk. Instrumented about warning signs.     Return in about 4 weeks (around 06/13/2016) for depre,fatigue,sleep.     -Ms.Pricilla Holm Mostafa was advised to return or notify a doctor immediately if  symptoms worsen or new concerns arise.       Amil Bouwman G. Martinique, MD  [facility]. [place] office.

## 2016-05-16 NOTE — Progress Notes (Signed)
Pre visit review using our clinic review tool, if applicable. No additional management support is needed unless otherwise documented below in the visit note. 

## 2016-05-21 ENCOUNTER — Other Ambulatory Visit: Payer: Self-pay | Admitting: Family Medicine

## 2016-05-23 ENCOUNTER — Other Ambulatory Visit: Payer: Self-pay

## 2016-05-23 DIAGNOSIS — G4700 Insomnia, unspecified: Secondary | ICD-10-CM

## 2016-05-23 MED ORDER — DOXEPIN HCL 10 MG PO CAPS: 10.0000 mg | ORAL_CAPSULE | Freq: Every day | ORAL | 1 refills | Status: DC

## 2016-06-04 ENCOUNTER — Other Ambulatory Visit: Payer: Self-pay | Admitting: Family Medicine

## 2016-06-06 NOTE — Telephone Encounter (Signed)
Celexa was changed to Effexor. I explained that if she had worsening symptoms she could resume Celexa 10 mg (1/2 tab) instead 20 mg along with new Rx for Effexor. Thanks, BJ

## 2016-06-15 NOTE — Progress Notes (Signed)
HPI:   [name] is a 71 y.o. female, who is here today to follow on some chronic medical problems.  I saw her last for acute visit on 05/16/16, fatigue. She has Hx of OSA, referred to pulmonologist. According to pt, she has appt for sleep study 07/2016.   Hx of insomnia, she is on Doxepin 10 mg , sleeping about 5 hours straight before she wakes up. She sometimes can go back to sleep. She is tolerating medication well and denies any side effect.  Depression: She was changed to Effexor ER 75 mg daily,Celexa discontinued. She is "not as down as she was", she is doing "pretty good." Denies crying spells. Motivation is mildly better. She denies suicidal thoughts.  She denies side effects.  Hx of exertional dyspnea, she is on Symbicort 80-4.5 mcg bid and recommended using Albuterol inh before moderate physical activity but she has not done so. She is using albuterol as needed for dyspnea, she has needed twice since her last office visit. She denies associated chest pain, diaphoresis, palpitations, or syncope.  In general dyspnea has improved.   Obesity:  Dietary changes since her last OV: She is not as hungry as she used to be, she is eating small portions. Exercising: Not consistently , she is more active, going out more often. She has noted some wt loss.   Review of Systems  Constitutional: Positive for fatigue. Negative for appetite change, diaphoresis and fever.  HENT: Negative for mouth sores, nosebleeds, sore throat and trouble swallowing.   Respiratory: Positive for shortness of breath. Negative for cough and wheezing.   Cardiovascular: Negative for chest pain, palpitations and leg swelling.  Gastrointestinal: Negative for abdominal pain, nausea and vomiting.       Negative for changes in bowel habits.  Musculoskeletal: Positive for gait problem. Negative for myalgias.  Neurological: Negative for seizures, syncope, speech difficulty, weakness and headaches.    Psychiatric/Behavioral: Positive for sleep disturbance. Negative for confusion, hallucinations and suicidal ideas. The patient is nervous/anxious.       Current Outpatient Prescriptions on File Prior to Visit  Medication Sig Dispense Refill  . albuterol (PROVENTIL HFA;VENTOLIN HFA) 108 (90 Base) MCG/ACT inhaler Inhale 2 puffs into the lungs every 6 (six) hours as needed for wheezing or shortness of breath. 1 Inhaler 0  . atenolol-chlorthalidone (TENORETIC) 50-25 MG tablet Take 1 tablet by mouth daily. 30 tablet 2  . budesonide-formoterol (SYMBICORT) 80-4.5 MCG/ACT inhaler Inhale 2 puffs into the lungs 2 (two) times daily. 1 Inhaler 2  . calcium & magnesium carbonates (MYLANTA) 150-569 MG tablet 1000 mg 2 tablets    . Calcium-Vitamin D-Vitamin K (CALCIUM + D) [phone] MG-UNT-MCG CHEW 2 tabs    . folic acid (FOLVITE) 1 MG tablet 1 tablet    . Glucosamine Sulfate-MSM 250-250 MG CAPS     . omeprazole (PRILOSEC OTC) 20 MG tablet 1 tablet    . sulfaSALAzine (AZULFIDINE) 500 MG tablet TAKE TWO TABLETS BY MOUTH TWICE DAILY     No current facility-administered medications on file prior to visit.      Past Medical History:  Diagnosis Date  . Anemia   . Anxiety   . Arthritis   . Crohn's disease ([place])   . Depression   . GERD (gastroesophageal reflux disease)   . Heart murmur   . Hypertension   . Reflux    Allergies  Allergen Reactions  . Penicillin G Nausea Only    Social History  Social History  . Marital status: Widowed    Spouse name: N/A  . Number of children: N/A  . Years of education: N/A   Social History Main Topics  . Smoking status: Never Smoker  . Smokeless tobacco: Never Used  . Alcohol use No  . Drug use: No  . Sexual activity: Not Currently   Other Topics Concern  . None   Social History Narrative  . None    Vitals:   06/16/16 1440  BP: 136/70  Pulse: 95  Resp: 12  O2 sat at RA 96%.  Body mass index is 39.58 kg/m.   Wt Readings from Last 3  Encounters:  06/16/16 189 lb 6 oz (85.9 kg)  05/16/16 192 lb 2 oz (87.1 kg)  04/14/16 194 lb 4 oz (88.1 kg)     Physical Exam  Nursing note and vitals reviewed. Constitutional: She is oriented to person, place, and time. She appears well-developed. No distress.  HENT:  Head: Atraumatic.  Mouth/Throat: Oropharynx is clear and moist and mucous membranes are normal.  Eyes: Conjunctivae and EOM are normal.  Cardiovascular: Normal rate and regular rhythm.   Murmur (I/VI SEM RUSB) heard. Pulses:      Dorsalis pedis pulses are 2+ on the right side, and 2+ on the left side.  Respiratory: Effort normal and breath sounds normal. No respiratory distress.  GI: Soft. She exhibits no mass. There is no hepatomegaly. There is no tenderness.  Musculoskeletal: She exhibits no edema.  Lymphadenopathy:    She has no cervical adenopathy.  Neurological: She is alert and oriented to person, place, and time. She has normal strength.  Stable gait, uses a cane for assistance.  Skin: Skin is warm. No erythema.  Psychiatric: Her mood appears anxious. She expresses no suicidal ideation.  appropriately groomed, good eye contact.    ASSESSMENT AND PLAN:   Kristen Gray was seen today for follow-up.  Diagnoses and all orders for this visit:  Insomnia, unspecified type  Improved. Good sleep hygiene discussed. Some side effects of medication discussed. F/U in 4 months.  -     doxepin (SINEQUAN) 10 MG capsule; Take 1 capsule (10 mg total) by mouth at bedtime.  Dyspnea, unspecified type  Improved. Cardiac work up negative 04/2015. ? COPD and deconditioning. Symbicort has helped as well as Albuterol inh. No changes in current management. F/U in 4 months.  Generalized anxiety disorder  Improved. No changes in Effexor. F/U in 4 months.  -     venlafaxine XR (EFFEXOR XR) 75 MG 24 hr capsule; Take 1 capsule (75 mg total) by mouth daily with breakfast.  Moderate episode of recurrent major depressive  disorder (HCC)   Improved. Tolerating medication well. No changes today. Instructed about warning signs.  We discussed side effects of medications she is taking.  -     venlafaxine XR (EFFEXOR XR) 75 MG 24 hr capsule; Take 1 capsule (75 mg total) by mouth daily with breakfast.  BMI 40.0-44.9, adult (HCC)  She has lost about 5 lb sine her last OV. Encouraged to continue working on wt loss. We discussed benefits of wt loss as well as adverse effects of obesity.    -Ms. Kristen Gray was advised to return sooner than planned today if new concerns arise.       Briannia Laba G. Martinique, MD  [facility]. [place] office.

## 2016-06-16 ENCOUNTER — Ambulatory Visit (INDEPENDENT_AMBULATORY_CARE_PROVIDER_SITE_OTHER): Payer: PPO | Admitting: Family Medicine

## 2016-06-16 ENCOUNTER — Encounter: Payer: Self-pay | Admitting: Family Medicine

## 2016-06-16 VITALS — BP 136/70 | HR 95 | Resp 12 | Ht <= 58 in | Wt 189.4 lb

## 2016-06-16 DIAGNOSIS — G4700 Insomnia, unspecified: Secondary | ICD-10-CM | POA: Diagnosis not present

## 2016-06-16 DIAGNOSIS — F411 Generalized anxiety disorder: Secondary | ICD-10-CM | POA: Diagnosis not present

## 2016-06-16 DIAGNOSIS — R0600 Dyspnea, unspecified: Secondary | ICD-10-CM

## 2016-06-16 DIAGNOSIS — F331 Major depressive disorder, recurrent, moderate: Secondary | ICD-10-CM | POA: Diagnosis not present

## 2016-06-16 DIAGNOSIS — Z6841 Body Mass Index (BMI) 40.0 and over, adult: Secondary | ICD-10-CM

## 2016-06-16 NOTE — Patient Instructions (Signed)
A few things to remember from today's visit:   Insomnia, unspecified type  Generalized anxiety disorder  Moderate episode of recurrent major depressive disorder (HCC)  Dyspnea, unspecified type  BMI 40.0-44.9, adult ([place])   [name], today we have followed on some of your chronic medical problems and they seem to be stable, so no changes in current management today.  Review medication list and be sure it is accurate.  -Remember a healthy diet and regular physical activity are very important for prevention as well as for well being; they also help with many chronic problems, decreasing the need of adding new medications and delaying or preventing possible complications.  I will see you back in 4 months.  Keep appt with sleep clinic.    ? What can I do to sleep better?   Improving your sleep habits is a good start.   Medical or psychiatric conditions might be making your insomnia worse.  Medicine might help, but you shouldn't use sleeping pills long term.   Some people need more sleep than others.   Sleep usually occurs in two- to three-hour cycles, so it is important to get at least three uninterrupted hours of sleep.  The following tips can help you develop better sleep habits: Go to bed and wake up at the same time each day Lie down to sleep only when sleepy. If you can't sleep after 20 minutes, get out of bed and go to another room; return to the bedroom when you are tired; repeat as necessary.  Don't do things in bed that might keep you awake, like watching television, reading, talking on the phone, or worrying Avoid caffeine, nicotine, or alcohol for at least four to six hours beforebedtime\ls1Avoid strenuous exercise within four hours of bedtime. Avoid daytime napping. Relax before going to bed. Avoid eating large meals or drinking a lot of water or other liquids in the evening. Keep the bedroom a comfortable temperature. Use earplugs if noise is a  problem. Expose yourself to daytime light for at least 30 minutes each morning   Remember to arrange your follow up appt before leaving today.  Please follow sooner than planned if a new concern arises.  Please be sure medication list is accurate. If a new problem present, please set up appointment sooner than planned today.

## 2016-06-19 MED ORDER — VENLAFAXINE HCL ER 75 MG PO CP24: 75.0000 mg | ORAL_CAPSULE | Freq: Every day | ORAL | 3 refills | Status: DC

## 2016-06-19 MED ORDER — DOXEPIN HCL 10 MG PO CAPS: 10.0000 mg | ORAL_CAPSULE | Freq: Every day | ORAL | 2 refills | Status: DC

## 2016-07-05 ENCOUNTER — Other Ambulatory Visit: Payer: Self-pay | Admitting: Family Medicine

## 2016-07-07 ENCOUNTER — Other Ambulatory Visit: Payer: Self-pay | Admitting: Family Medicine

## 2016-07-11 ENCOUNTER — Other Ambulatory Visit: Payer: Self-pay | Admitting: Family Medicine

## 2016-07-11 DIAGNOSIS — F331 Major depressive disorder, recurrent, moderate: Secondary | ICD-10-CM

## 2016-07-26 ENCOUNTER — Ambulatory Visit (INDEPENDENT_AMBULATORY_CARE_PROVIDER_SITE_OTHER): Payer: PPO | Admitting: Pulmonary Disease

## 2016-07-26 ENCOUNTER — Encounter: Payer: Self-pay | Admitting: Pulmonary Disease

## 2016-07-26 ENCOUNTER — Other Ambulatory Visit: Payer: Self-pay | Admitting: Family Medicine

## 2016-07-26 DIAGNOSIS — G4733 Obstructive sleep apnea (adult) (pediatric): Secondary | ICD-10-CM

## 2016-07-26 DIAGNOSIS — I272 Pulmonary hypertension, unspecified: Secondary | ICD-10-CM | POA: Diagnosis not present

## 2016-07-26 DIAGNOSIS — F5104 Psychophysiologic insomnia: Secondary | ICD-10-CM | POA: Diagnosis not present

## 2016-07-26 DIAGNOSIS — R0600 Dyspnea, unspecified: Secondary | ICD-10-CM | POA: Diagnosis not present

## 2016-07-26 MED ORDER — ALBUTEROL SULFATE HFA 108 (90 BASE) MCG/ACT IN AERS: 2.0000 | INHALATION_SPRAY | Freq: Four times a day (QID) | RESPIRATORY_TRACT | 2 refills | Status: DC | PRN

## 2016-07-26 MED ORDER — TRAZODONE HCL 50 MG PO TABS: 50.0000 mg | ORAL_TABLET | Freq: Every day | ORAL | 0 refills | Status: DC

## 2016-07-26 NOTE — Addendum Note (Signed)
Addended by: Tyson Dense on: 07/26/2016 03:44 PM   Modules accepted: Orders

## 2016-07-26 NOTE — Patient Instructions (Addendum)
Home sleep test. Trial of trazodone 50 mg at bedtime  Rules of sleep hygiene were discussed  - light exercise -avoid caffeinated beverages - no more than 20 mins staying awake in bed, if not asleep, get out of bed & reading or light music - Scheduled fixed bedtime and wake up at times -If nap in the afternoon only 30 minutes    Lung scan will be scheduled. Referral to cardiology for evaluation of pulmonary hypertension

## 2016-07-26 NOTE — Assessment & Plan Note (Signed)
Given excessive daytime somnolence, narrow pharyngeal exam, witnessed apneas & loud snoring, obstructive sleep apnea is very likely & an overnight polysomnogram will be scheduled as a home study. The pathophysiology of obstructive sleep apnea , it's cardiovascular consequences & modes of treatment including CPAP were discused with the patient in detail & they evidenced understanding.  Pretest probability is intermediate

## 2016-07-26 NOTE — Assessment & Plan Note (Signed)
The seems to be the main issue Compounded by factors of anxiety depression an extremely poor sleep hygiene Trial of trazodone 50 mg at bedtime  Rules of sleep hygiene were discussed  - light exercise -avoid caffeinated beverages - no more than 20 mins staying awake in bed, if not asleep, get out of bed & reading or light music - Scheduled fixed bedtime and wake up at times -If nap in the afternoon only 30 minutes

## 2016-07-26 NOTE — Assessment & Plan Note (Signed)
This is likely a combination of pulmonary hypertension, obesity and severe deconditioning.  Regardless of evaluation of pulmonary hypertension, she would benefit from a cardiopulmonary rehabilitation program.

## 2016-07-26 NOTE — Progress Notes (Signed)
Subjective:    Patient ID: Kristen Gray, female    DOB: 1945-12-22, 71 y.o.   MRN: [id number]  HPI   Chief Complaint  Patient presents with  . Sleep Consult    Pt here for sleep consult referred by Dr. Betty Martinique. Pt not sleeping well at night, pt feels like she feels that she is breathing thru a straw. Pt has tightest in chest, SOB more often than normal in last two months. Pt has a cough at night and wakes her up each time.     71 year old never smoker referred for evaluation of dyspnea and sleep-disordered breathing. She reports feeling tired all the time she also reports dyspnea and exertion for 3 years which is progressive. I note that she was seen in our office 09/2015 PFTs showed no evidence of airway obstruction, with FEV1 of 79% TLC of 97% and DLCO 79%. Chest x-ray 09/2015 did not show any infiltrates or effusions She also underwent a cardiac evaluation by Dr. Wynonia Lawman - Echo showed normal LV function but moderate MR and moderate TR with RVSP of 51 She was able to walk 2 laps around the office with oxygen saturation dropping from 90% to 94% with heart rate going from 89-104  She also reports long-standing insomnia and feeling fatigued all the time. She has used multiple antidepressants such as Celexa and mirtazapine. Last, she was given doxepin and Effexor but she admits to not using this anymore. She has turned down psychiatric consultation in the past. Bedtime is between midnight and 2 AM, sleep latency is about 2 hours, she sleeps on her left side cannot lie on her back, snoring has been noted by family members but no objective bed partner history is available. She denies gasping episodes that wake her up from sleep. She wakes up every 2 hours until she is out of bed by around known. She's gained about 10 pounds in the last few years  There is no history suggestive of cataplexy, sleep paralysis or parasomnias  She admits to sadness of mood after the death of her  husband   Past Medical History:  Diagnosis Date  . Anemia   . Anxiety   . Arthritis   . Crohn's disease ([place])   . Depression   . GERD (gastroesophageal reflux disease)   . Heart murmur   . Hypertension   . Reflux      Past Surgical History:  Procedure Laterality Date  . ANKLE SURGERY    . CARPAL TUNNEL RELEASE    . CHOLECYSTECTOMY    . COLON SURGERY       Allergies  Allergen Reactions  . Penicillin G Nausea Only    Social History   Social History  . Marital status: Widowed    Spouse name: N/A  . Number of children: N/A  . Years of education: N/A   Occupational History  . Not on file.   Social History Main Topics  . Smoking status: Never Smoker  . Smokeless tobacco: Never Used  . Alcohol use No  . Drug use: No  . Sexual activity: Not Currently   Other Topics Concern  . Not on file   Social History Narrative  . No narrative on file    Family History  Problem Relation Age of Onset  . Heart disease Mother        RF  . Stroke Father   . Diabetes Father   . Hyperlipidemia Father   . Hypertension Father  Review of Systems Positive for shortness of breath with activity, acid heartburn, nasal congestion, anxiety and depression and knee pains  Constitutional: negative for anorexia, fevers and sweats  Eyes: negative for irritation, redness and visual disturbance  Ears, nose, mouth, throat, and face: negative for earaches, epistaxis and sore throat  Respiratory: negative for cough,  sputum and wheezing  Cardiovascular: negative for chest pain, dyspnea, lower extremity edema, orthopnea, palpitations and syncope  Gastrointestinal: negative for abdominal pain, constipation, diarrhea, melena, nausea and vomiting  Genitourinary:negative for dysuria, frequency and hematuria  Hematologic/lymphatic: negative for bleeding, easy bruising and lymphadenopathy  Musculoskeletal:negative for arthralgias, muscle weakness and stiff joints  Neurological:  negative for coordination problems, gait problems, headaches and weakness  Endocrine: negative for diabetic symptoms including polydipsia, polyuria and weight loss     Objective:   Physical Exam  Gen. Pleasant, obese, in no distress, normal affect ENT - no lesions, no post nasal drip, class 2 airway Neck: No JVD, no thyromegaly, no carotid bruits Lungs: no use of accessory muscles, no dullness to percussion, decreased without rales or rhonchi  Cardiovascular: Rhythm regular, heart sounds  normal, no murmurs or gallops, no peripheral edema Abdomen: soft and non-tender, no hepatosplenomegaly, BS normal. Musculoskeletal: No deformities, no cyanosis or clubbing Neuro:  alert, non focal, no tremors       Assessment & Plan:

## 2016-07-26 NOTE — Assessment & Plan Note (Signed)
Not clear if this is related to valvular heart disease or to diastolic dysfunction, doubt primary cause We'll refer her to advanced heart failure service for further evaluation We'll proceed with VQ scan to rule out chronic PE as a cause

## 2016-08-04 ENCOUNTER — Ambulatory Visit (HOSPITAL_COMMUNITY)
Admission: RE | Admit: 2016-08-04 | Discharge: 2016-08-04 | Disposition: A | Discharge status: Home / Self Care | Payer: PPO | Source: Ambulatory Visit | Attending: Pulmonary Disease | Admitting: Pulmonary Disease

## 2016-08-04 ENCOUNTER — Encounter (HOSPITAL_COMMUNITY)
Admission: RE | Admit: 2016-08-04 | Discharge: 2016-08-04 | Disposition: A | Discharge status: Home / Self Care | Payer: PPO | Source: Ambulatory Visit | Attending: Pulmonary Disease | Admitting: Pulmonary Disease

## 2016-08-04 DIAGNOSIS — I272 Pulmonary hypertension, unspecified: Secondary | ICD-10-CM | POA: Diagnosis not present

## 2016-08-04 DIAGNOSIS — R0600 Dyspnea, unspecified: Secondary | ICD-10-CM | POA: Diagnosis not present

## 2016-08-04 DIAGNOSIS — R0602 Shortness of breath: Secondary | ICD-10-CM | POA: Diagnosis not present

## 2016-08-04 IMAGING — DX DG CHEST 2V
2 series · 2 of 2 positions shown · non-contrast
Comparison: [DATE]

CLINICAL DATA: Shortness of Breath

EXAM:
CHEST  2 VIEW

[chest pa]
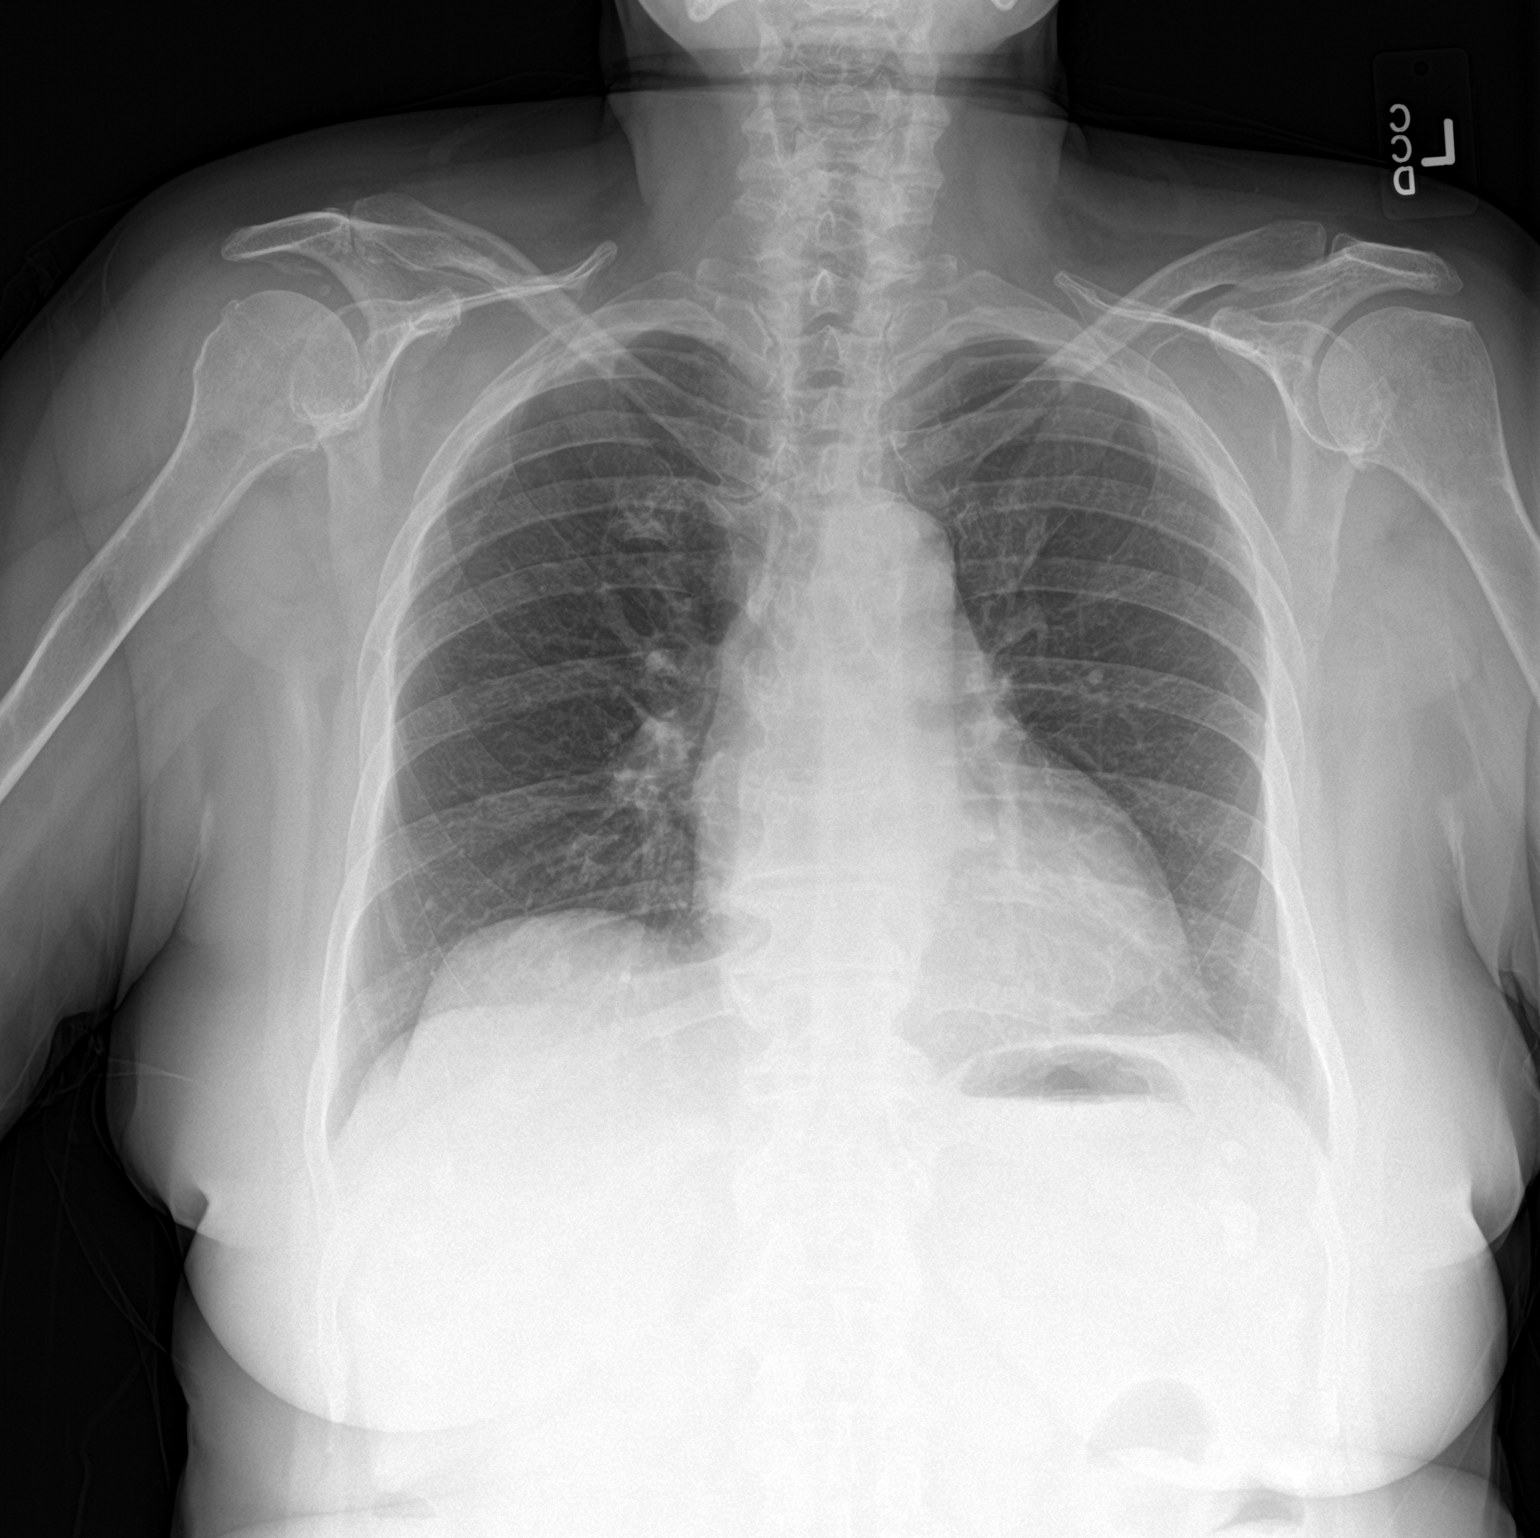

[chest lat]
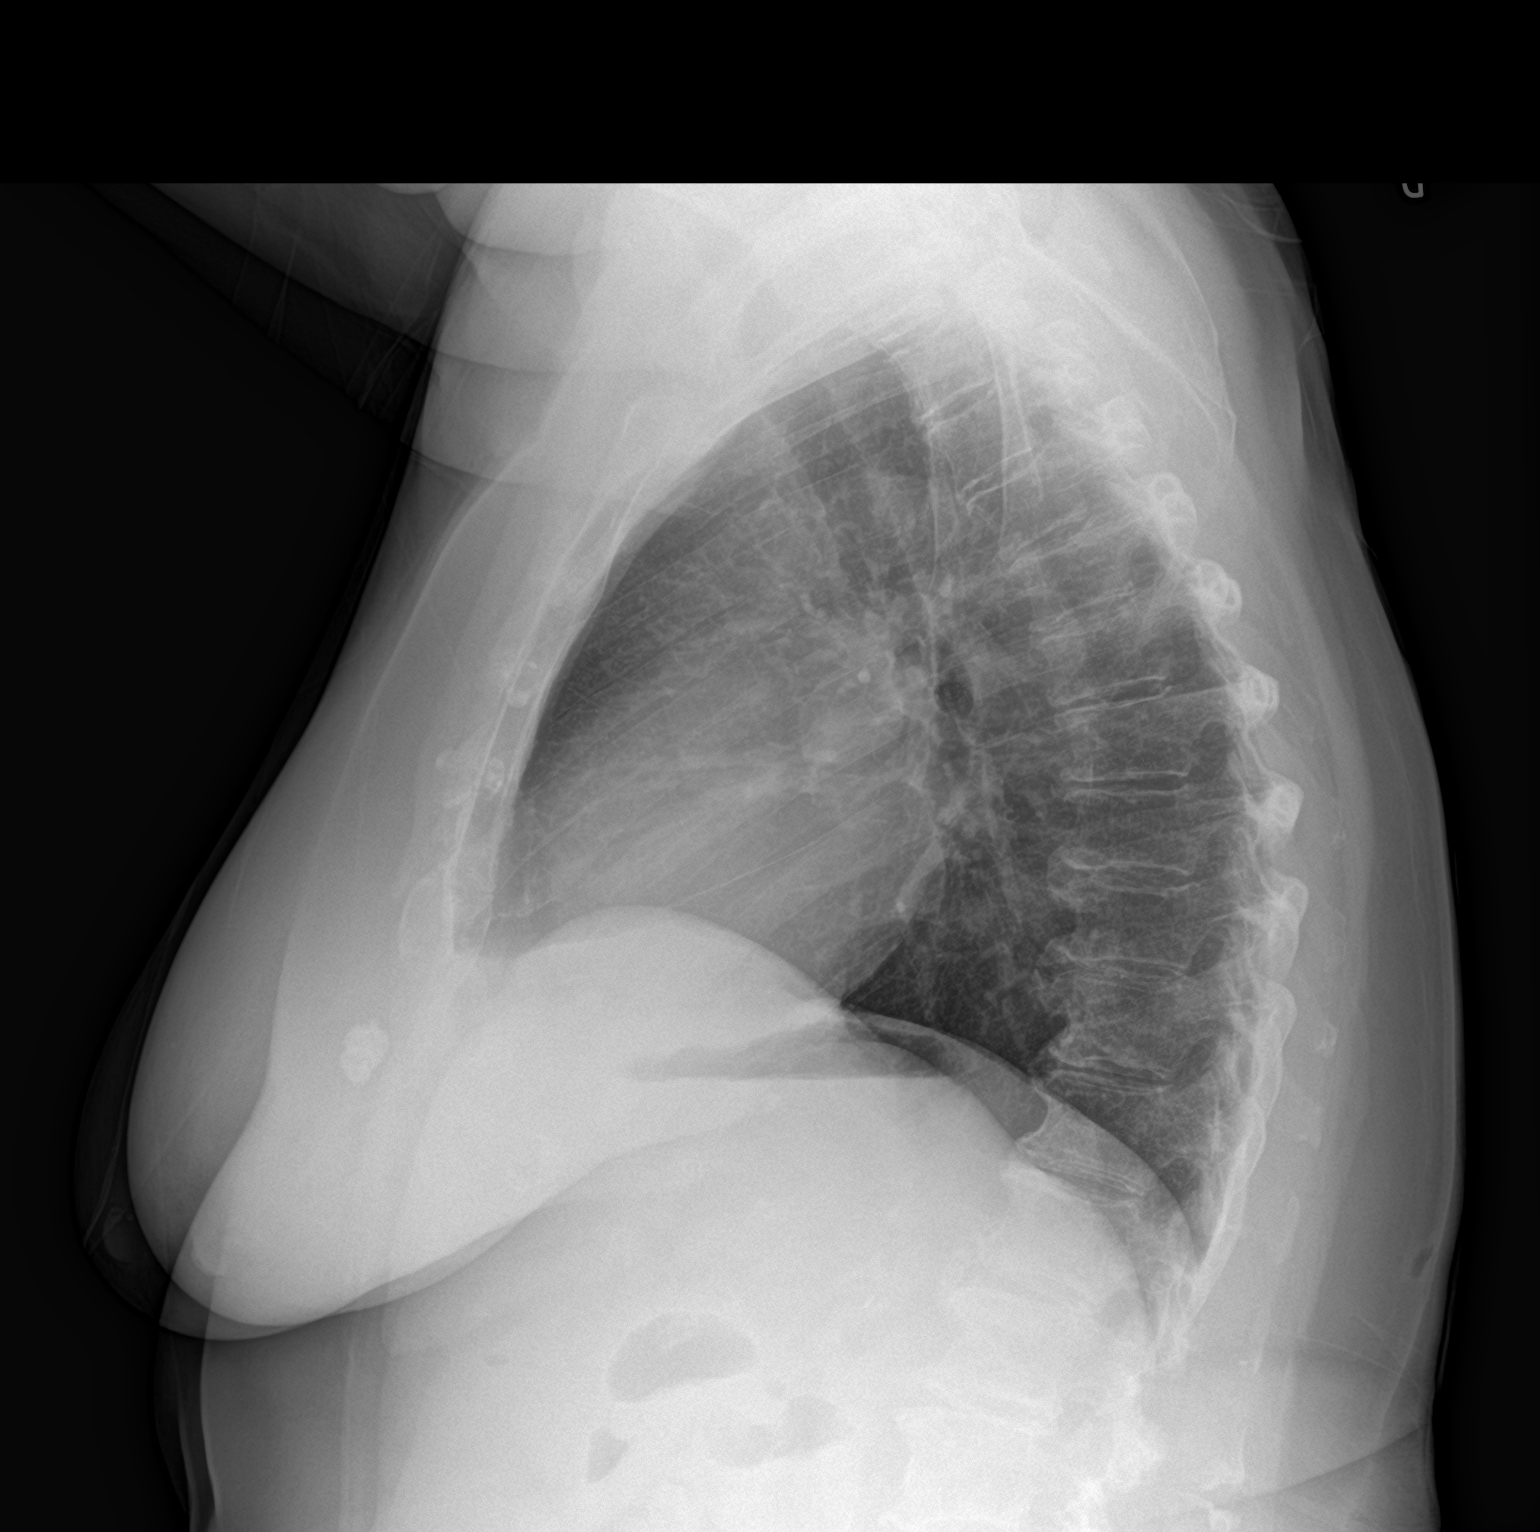

[2 of 2 positions shown; findings below may reference images not displayed]

FINDINGS: There is no edema or consolidation. The heart size and pulmonary
vascularity are normal. There is degenerative change in the thoracic
spine.
IMPRESSION: No edema or consolidation.

## 2016-08-04 MED ORDER — TECHNETIUM TC 99M DIETHYLENETRIAME-PENTAACETIC ACID
30.0000 | Freq: Once | INTRAVENOUS | Status: AC | PRN
  Administered 2016-08-04: 30 via INTRAVENOUS

## 2016-08-04 MED ORDER — TECHNETIUM TO 99M ALBUMIN AGGREGATED
5.3000 | Freq: Once | INTRAVENOUS | Status: AC | PRN
  Administered 2016-08-04: 5.3 via INTRAVENOUS

## 2016-08-04 NOTE — Progress Notes (Signed)
Called pt to discuss results of test with her. Pt expressed understanding of results. Nothing further needed at this time.

## 2016-08-07 DIAGNOSIS — G4733 Obstructive sleep apnea (adult) (pediatric): Secondary | ICD-10-CM | POA: Diagnosis not present

## 2016-08-08 DIAGNOSIS — K5090 Crohn's disease, unspecified, without complications: Secondary | ICD-10-CM | POA: Diagnosis not present

## 2016-08-13 ENCOUNTER — Other Ambulatory Visit: Payer: Self-pay | Admitting: Pulmonary Disease

## 2016-08-15 NOTE — Telephone Encounter (Signed)
30 tabs x 2 refills Further from PCP please

## 2016-08-15 NOTE — Telephone Encounter (Signed)
RA, during Kristen Gray's OV on 07/26/16, you wanted her to try a trial of trazodone 26m (1 tablet once at bedtime as needed). She was only given 14 tabs.   She is requesting a refill on this medication. Is it ok to refill it for her with 14 tablets or 30 tablets? Thanks!

## 2016-08-17 ENCOUNTER — Telehealth: Payer: Self-pay | Admitting: Pulmonary Disease

## 2016-08-17 ENCOUNTER — Other Ambulatory Visit: Payer: Self-pay | Admitting: *Deleted

## 2016-08-17 DIAGNOSIS — G4734 Idiopathic sleep related nonobstructive alveolar hypoventilation: Secondary | ICD-10-CM

## 2016-08-17 DIAGNOSIS — G4733 Obstructive sleep apnea (adult) (pediatric): Secondary | ICD-10-CM

## 2016-08-17 NOTE — Telephone Encounter (Signed)
Per RA, O2 level seemed to drop during HST. Does not have OSA. Next step would be to have a ONO to see if she would qualify for oxygen at night.

## 2016-08-22 NOTE — Telephone Encounter (Signed)
Spoke with patient. She is aware of results. Will place order for ONO.

## 2016-08-30 ENCOUNTER — Ambulatory Visit (HOSPITAL_COMMUNITY)
Admission: RE | Admit: 2016-08-30 | Discharge: 2016-08-30 | Disposition: A | Discharge status: Home / Self Care | Payer: PPO | Source: Ambulatory Visit | Attending: Cardiology | Admitting: Cardiology

## 2016-08-30 ENCOUNTER — Encounter (HOSPITAL_COMMUNITY): Payer: Self-pay | Admitting: Cardiology

## 2016-08-30 ENCOUNTER — Encounter (HOSPITAL_COMMUNITY): Payer: Self-pay | Admitting: *Deleted

## 2016-08-30 ENCOUNTER — Other Ambulatory Visit (HOSPITAL_COMMUNITY): Payer: Self-pay | Admitting: *Deleted

## 2016-08-30 VITALS — BP 120/60 | HR 70 | Wt 179.0 lb

## 2016-08-30 DIAGNOSIS — I272 Pulmonary hypertension, unspecified: Secondary | ICD-10-CM | POA: Diagnosis not present

## 2016-08-30 DIAGNOSIS — K219 Gastro-esophageal reflux disease without esophagitis: Secondary | ICD-10-CM | POA: Insufficient documentation

## 2016-08-30 DIAGNOSIS — F329 Major depressive disorder, single episode, unspecified: Secondary | ICD-10-CM | POA: Insufficient documentation

## 2016-08-30 DIAGNOSIS — G4733 Obstructive sleep apnea (adult) (pediatric): Secondary | ICD-10-CM | POA: Diagnosis not present

## 2016-08-30 DIAGNOSIS — E669 Obesity, unspecified: Secondary | ICD-10-CM | POA: Insufficient documentation

## 2016-08-30 DIAGNOSIS — R0600 Dyspnea, unspecified: Secondary | ICD-10-CM

## 2016-08-30 DIAGNOSIS — K5090 Crohn's disease, unspecified, without complications: Secondary | ICD-10-CM | POA: Diagnosis not present

## 2016-08-30 DIAGNOSIS — Z79899 Other long term (current) drug therapy: Secondary | ICD-10-CM | POA: Diagnosis not present

## 2016-08-30 DIAGNOSIS — R0609 Other forms of dyspnea: Secondary | ICD-10-CM | POA: Diagnosis not present

## 2016-08-30 DIAGNOSIS — R5382 Chronic fatigue, unspecified: Secondary | ICD-10-CM | POA: Diagnosis not present

## 2016-08-30 DIAGNOSIS — I10 Essential (primary) hypertension: Secondary | ICD-10-CM | POA: Insufficient documentation

## 2016-08-30 DIAGNOSIS — Z6837 Body mass index (BMI) 37.0-37.9, adult: Secondary | ICD-10-CM | POA: Insufficient documentation

## 2016-08-30 LAB — BASIC METABOLIC PANEL
Anion gap: 10 (ref 5–15)
BUN: 19 mg/dL (ref 6–20)
CO2: 29 mmol/L (ref 22–32)
Calcium: 10.1 mg/dL (ref 8.9–10.3)
Chloride: 97 mmol/L — ABNORMAL LOW (ref 101–111)
Creatinine, Ser: 1.31 mg/dL — ABNORMAL HIGH (ref 0.44–1.00)
GFR calc Af Amer: 46 mL/min — ABNORMAL LOW (ref >60)
GFR calc non Af Amer: 39 mL/min — ABNORMAL LOW (ref >60)
Glucose, Bld: 145 mg/dL — ABNORMAL HIGH (ref 65–99)
Potassium: 3.3 mmol/L — ABNORMAL LOW (ref 3.5–5.1)
Sodium: 136 mmol/L (ref 135–145)

## 2016-08-30 LAB — PROTIME-INR
INR: 1
Prothrombin Time: 13.2 seconds (ref 11.4–15.2)

## 2016-08-30 LAB — CBC
HCT: 38.4 % (ref 36.0–46.0)
Hemoglobin: 12.8 g/dL (ref 12.0–15.0)
MCH: 32.2 pg (ref 26.0–34.0)
MCHC: 33.3 g/dL (ref 30.0–36.0)
MCV: 96.5 fL (ref 78.0–100.0)
Platelets: 234 10*3/uL (ref 150–400)
RBC: 3.98 MIL/uL (ref 3.87–5.11)
RDW: 13.2 % (ref 11.5–15.5)
WBC: 8 10*3/uL (ref 4.0–10.5)

## 2016-08-30 LAB — BRAIN NATRIURETIC PEPTIDE: B Natriuretic Peptide: 69.1 pg/mL (ref 0.0–100.0)

## 2016-08-30 NOTE — Patient Instructions (Signed)
Labs today  Heart Catheterization on Thursday 09/07/16, see instruction sheet  Your physician recommends that you schedule a follow-up appointment in: 6 weeks

## 2016-08-31 ENCOUNTER — Telehealth (HOSPITAL_COMMUNITY): Payer: Self-pay | Admitting: *Deleted

## 2016-08-31 MED ORDER — POTASSIUM CHLORIDE CRYS ER 20 MEQ PO TBCR: 20.0000 meq | EXTENDED_RELEASE_TABLET | Freq: Every day | ORAL | 3 refills | Status: DC

## 2016-08-31 NOTE — Progress Notes (Signed)
PCP: Betty Martinique Pulmonology: Dr. Elsworth Soho Cardiology: [name]  71 yo with history of depression, HTN, and Crohns disease was referred by Dr. Elsworth Soho for evaluation of chronic exertional dyspnea.  She says that fatigue and dyspnea have been slowly progressive since 2013. They have been worse this year.  She is at the point where she is short of breath walking down the hall in her house.  She came in this morning on a wheelchair to avoid the walk.  She uses a cane.  She fatigues very easily and generally does very little activity.  Her weight has slowly increased.  No chest pain or tightness.  No orthopnea/PND.  She never smoked.  No palpitations or lightheadedness.   She had a Cardiolite in 4/17 that was normal.  Echo in 4/17 showed normal EF with mild-moderate AI, mild-moderate MR, moderate TR, and elevated PA pressure at 51 mmHg. PFTs in 9/17 were unremarkable and V/Q scan in 7/18 showed no acute or chronic PE.  She was evaluated in the pulmonary clinic and was sent here for evaluation for heart catheterization. There was no significant lung abnormality noted.  She was found to have mild OSA.   ECG (personally reviewed): short PR interval, otherwise normal.   Labs (4/18): hgb 12.5, TSH normal  PMH: 1. Depression 2. Crohns disease 3. GERD 4. H/o CCY 5. HTN 6. Chronic exertional dyspnea:  - Echo (4/17): EF 55%, mild LVH, mild to moderate AI, mild to moderate MR, moderate TR, PASP 51 mmHg.  - Lexiscan Cardiolite (4/17): EF 50%, no ischemia/infarction.  - V/Q scan (7/18): no evidence for chronic PE.  - PFTs (9/17): Unremarkable.  7. OSA: Mild.   SH: Lives alone in [place], widow.  No smoking or ETOH.  FH: Mother with rheumatic heart disease, by description she had mitral stenosis.  No other known heart disease.   ROS: All systems reviewed and negative except as per HPI.   Current Outpatient Prescriptions  Medication Sig Dispense Refill  . albuterol (PROAIR HFA) 108 (90 Base) MCG/ACT  inhaler Inhale 2 puffs into the lungs every 6 (six) hours as needed for wheezing or shortness of breath. 18 g 2  . atenolol-chlorthalidone (TENORETIC) 50-25 MG tablet Take 1 tablet by mouth daily. 30 tablet 2  . budesonide-formoterol (SYMBICORT) 80-4.5 MCG/ACT inhaler Inhale 2 puffs into the lungs 2 (two) times daily. 1 Inhaler 2  . calcium & magnesium carbonates (MYLANTA) 546-503 MG tablet 1000 mg 2 tablets    . Calcium-Vitamin D-Vitamin K (CALCIUM + D) [phone] MG-UNT-MCG CHEW 2 tabs    . folic acid (FOLVITE) 1 MG tablet 1 tablet    . omeprazole (PRILOSEC OTC) 20 MG tablet 1 tablet    . sulfaSALAzine (AZULFIDINE) 500 MG tablet TAKE TWO TABLETS BY MOUTH TWICE DAILY    . traZODone (DESYREL) 50 MG tablet TAKE 1 TABLET BY MOUTH AT BEDTIME 30 tablet 2  . venlafaxine XR (EFFEXOR XR) 75 MG 24 hr capsule Take 1 capsule (75 mg total) by mouth daily with breakfast. 30 capsule 3   No current facility-administered medications for this encounter.    BP 120/60   Pulse 70   Wt 179 lb (81.2 kg)   SpO2 96%   BMI 37.41 kg/m  General: NAD, obese Neck: No JVD, no thyromegaly or thyroid nodule.  Lungs: Clear to auscultation bilaterally with normal respiratory effort. CV: Nondisplaced PMI.  Heart regular S1/S2, no S3/S4, no murmur.  No peripheral edema.  No carotid bruit.  Normal pedal  pulses.  Abdomen: Soft, nontender, no hepatosplenomegaly, no distention.  Skin: Intact without lesions or rashes.  Neurologic: Alert and oriented x 3.  Psych: Normal affect. Extremities: No clubbing or cyanosis.  HEENT: Normal.   Assessment/Plan: 1. Chronic exertional dyspnea: Slowly progressive.  Pulmonary evaluation has not shown any  definite significant abnormalities (PFTs unremarkable, V/Q negative).  She has mild OSA.  I suspect obesity and deconditioning play a role.  She does not appear significantly volume overloaded by exam.  Echo from 4/17 did suggest elevated PA pressure and mid to moderate valvular heart  disease.  It is possible that symptoms could be from diastolic CHF, pulmonary hypertension, or undiagnosed coronary disease.  - I think that the next appropriate step here will be evaluation by right and left heart cath. I discussed this with her today and went over risks/benefits.  She agrees to proceed.  She will need to spend the night afterwards regardless of result as she has no one to stay with her at home.  - She will need repeat echo to reassess valvular heart disease.  - Check BNP, BMET, CBC, INR.  2. Obesity: If cardiac cath is unremarkable, she will need to start exercise/diet program.  3. HTN: BP is controlled.  4.Depression: This certainly could play a role in her chronic fatigue.  5. Pulmonary hypertension: Noted by echo. This could be pulmonary venous hypertension (group 2) from diastolic CHF, but cannot rule out pulmonary arterial hypertension without RHC.   [name] 08/31/2016

## 2016-08-31 NOTE — Telephone Encounter (Signed)
-----   Message from Larey Dresser, MD sent at 08/30/2016  5:14 PM EDT ----- Add KCl 20 daily

## 2016-09-02 ENCOUNTER — Other Ambulatory Visit: Payer: Self-pay

## 2016-09-02 MED ORDER — ATENOLOL-CHLORTHALIDONE 50-25 MG PO TABS: 1.0000 | ORAL_TABLET | Freq: Every day | ORAL | 1 refills | Status: DC

## 2016-09-07 ENCOUNTER — Telehealth (HOSPITAL_COMMUNITY): Payer: Self-pay | Admitting: *Deleted

## 2016-09-07 NOTE — Telephone Encounter (Signed)
Per Dr Aundra Dubin pt's cath was resch from 8/16 to 8/23 at 10:30 am, pt is aware of new date/time, instructions reviewed with her again via phone

## 2016-09-12 ENCOUNTER — Ambulatory Visit (INDEPENDENT_AMBULATORY_CARE_PROVIDER_SITE_OTHER): Payer: PPO | Admitting: Orthopaedic Surgery

## 2016-09-12 ENCOUNTER — Ambulatory Visit (INDEPENDENT_AMBULATORY_CARE_PROVIDER_SITE_OTHER): Payer: PPO

## 2016-09-12 DIAGNOSIS — M1611 Unilateral primary osteoarthritis, right hip: Secondary | ICD-10-CM | POA: Diagnosis not present

## 2016-09-12 NOTE — Progress Notes (Signed)
Office Visit Note   Patient: Kristen Gray           Date of Birth: 17-Dec-1945           MRN: [id number] Visit Date: 09/12/2016              Requested by: Martinique, Betty G, MD [address] [place], [place] [address] PCP: Martinique, Betty G, MD   Assessment & Plan: Visit Diagnoses:  1. Primary osteoarthritis of right hip     Plan: Patient has advanced degenerative joint disease of both her right hip and her right knee. She had outside x-rays of her right knee which I reviewed. She is having issues with heart currently and she is modifying her activity enough to compensate for her right hip and knee arthritis. She is not mentally ready for any joint replacements. She'll let us know when she is. Follow-up as needed. She has my card.  Follow-Up Instructions: Return if symptoms worsen or fail to improve.   Orders:  Orders Placed This Encounter  Procedures  . XR HIP UNILAT W OR W/O PELVIS 2-3 VIEWS RIGHT   No orders of the defined types were placed in this encounter.     Procedures: No procedures performed   Clinical Data: No additional findings.   Subjective: No chief complaint on file.   Patient is a 71 year old female comes in with right hip and knee pain. She previously saw Dr. Levin [facility] orthopedics.  She endorses groin pain and right knee pain. She's had previous injection in her right knee with good results. She denies any numbness and tingling denies any radicular symptoms. Denies any injuries.    Review of Systems  Constitutional: Negative.   HENT: Negative.   Eyes: Negative.   Respiratory: Negative.   Cardiovascular: Negative.   Endocrine: Negative.   Musculoskeletal: Negative.   Neurological: Negative.   Hematological: Negative.   Psychiatric/Behavioral: Negative.   All other systems reviewed and are negative.    Objective: Vital Signs: There were no vitals taken for this visit.  Physical Exam  Constitutional: She is oriented to  person, place, and time. She appears well-developed and well-nourished.  HENT:  Head: Normocephalic and atraumatic.  Eyes: EOM are normal.  Neck: Neck supple.  Pulmonary/Chest: Effort normal.  Abdominal: Soft.  Neurological: She is alert and oriented to person, place, and time.  Skin: Skin is warm. Capillary refill takes less than 2 seconds.  Psychiatric: She has a normal mood and affect. Her behavior is normal. Judgment and thought content normal.  Nursing note and vitals reviewed.   Ortho Exam Right hip exam shows positive Stinchfield sign. Pain with rotation of the hip. Lateral hip is mildly tender. No sciatic tension signs. Right knee exam shows no joint effusion. Good range of motion. Collaterals and cruciates are stable. Specialty Comments:  No specialty comments available.  Imaging: Xr Hip Unilat W Or W/o Pelvis 2-3 Views Right  Result Date: 09/12/2016 Advanced right hip degenerative joint disease    PMFS History: Patient Active Problem List   Diagnosis Date Noted  . Primary osteoarthritis of right hip 09/12/2016  . Pulmonary hypertension ([place]) 07/26/2016  . OSA (obstructive sleep apnea) 07/26/2016  . BMI 40.0-44.9, adult ([place]) 04/14/2016  . Hypertension, essential 04/13/2016  . Vitamin D deficiency 04/13/2016  . Insomnia 04/13/2016  . Generalized anxiety disorder 04/13/2016  . 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 Osteoarthritis, 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/13/2016  . Dyspnea 10/07/2015   Past Medical History:  Diagnosis Date  .  Anemia   . Anxiety   . Arthritis   . Crohn's disease ([place])   . Depression   . GERD (gastroesophageal reflux disease)   . Heart murmur   . Hypertension   . Reflux     Family History  Problem Relation Age of Onset  . Heart disease Mother        RF  . Stroke Father   . Diabetes Father   . Hyperlipidemia Father   . Hypertension Father     Past Surgical History:  Procedure Laterality Date  . ANKLE SURGERY    . CARPAL TUNNEL RELEASE    .  CHOLECYSTECTOMY    . COLON SURGERY     Social History   Occupational History  . Not on file.   Social History Main Topics  . Smoking status: Never Smoker  . Smokeless tobacco: Never Used  . Alcohol use No  . Drug use: No  . Sexual activity: Not Currently

## 2016-09-15 ENCOUNTER — Observation Stay (HOSPITAL_COMMUNITY)
Admission: RE | Admit: 2016-09-15 | Discharge: 2016-09-16 | Disposition: A | Discharge status: Home / Self Care | Payer: PPO | Source: Ambulatory Visit | Attending: Cardiology | Admitting: Cardiology

## 2016-09-15 ENCOUNTER — Encounter (HOSPITAL_COMMUNITY)
Admission: RE | Disposition: A | Discharge status: Home / Self Care | Payer: Self-pay | Source: Ambulatory Visit | Attending: Cardiology

## 2016-09-15 DIAGNOSIS — Z880 Allergy status to penicillin: Secondary | ICD-10-CM | POA: Diagnosis not present

## 2016-09-15 DIAGNOSIS — M1990 Unspecified osteoarthritis, unspecified site: Secondary | ICD-10-CM | POA: Insufficient documentation

## 2016-09-15 DIAGNOSIS — E669 Obesity, unspecified: Secondary | ICD-10-CM | POA: Diagnosis not present

## 2016-09-15 DIAGNOSIS — I272 Pulmonary hypertension, unspecified: Secondary | ICD-10-CM | POA: Diagnosis not present

## 2016-09-15 DIAGNOSIS — R0609 Other forms of dyspnea: Secondary | ICD-10-CM | POA: Diagnosis not present

## 2016-09-15 DIAGNOSIS — R5382 Chronic fatigue, unspecified: Secondary | ICD-10-CM | POA: Insufficient documentation

## 2016-09-15 DIAGNOSIS — F329 Major depressive disorder, single episode, unspecified: Secondary | ICD-10-CM | POA: Insufficient documentation

## 2016-09-15 DIAGNOSIS — K5090 Crohn's disease, unspecified, without complications: Secondary | ICD-10-CM | POA: Diagnosis not present

## 2016-09-15 DIAGNOSIS — G4733 Obstructive sleep apnea (adult) (pediatric): Secondary | ICD-10-CM | POA: Insufficient documentation

## 2016-09-15 DIAGNOSIS — R5381 Other malaise: Secondary | ICD-10-CM | POA: Insufficient documentation

## 2016-09-15 DIAGNOSIS — Z79899 Other long term (current) drug therapy: Secondary | ICD-10-CM | POA: Insufficient documentation

## 2016-09-15 DIAGNOSIS — I10 Essential (primary) hypertension: Secondary | ICD-10-CM | POA: Diagnosis not present

## 2016-09-15 DIAGNOSIS — R0600 Dyspnea, unspecified: Secondary | ICD-10-CM | POA: Diagnosis present

## 2016-09-15 DIAGNOSIS — E876 Hypokalemia: Secondary | ICD-10-CM | POA: Insufficient documentation

## 2016-09-15 HISTORY — PX: RIGHT/LEFT HEART CATH AND CORONARY ANGIOGRAPHY: CATH118266

## 2016-09-15 LAB — CBC
HCT: 38.6 % (ref 36.0–46.0)
HCT: 37 % (ref 36.0–46.0)
Hemoglobin: 12.7 g/dL (ref 12.0–15.0)
Hemoglobin: 11.8 g/dL — ABNORMAL LOW (ref 12.0–15.0)
MCH: 31.9 pg (ref 26.0–34.0)
MCH: 31 pg (ref 26.0–34.0)
MCHC: 32.9 g/dL (ref 30.0–36.0)
MCHC: 31.9 g/dL (ref 30.0–36.0)
MCV: 97 fL (ref 78.0–100.0)
MCV: 97.1 fL (ref 78.0–100.0)
Platelets: 238 10*3/uL (ref 150–400)
Platelets: 223 10*3/uL (ref 150–400)
RBC: 3.98 MIL/uL (ref 3.87–5.11)
RBC: 3.81 MIL/uL — ABNORMAL LOW (ref 3.87–5.11)
RDW: 13.7 % (ref 11.5–15.5)
RDW: 14 % (ref 11.5–15.5)
WBC: 10.1 10*3/uL (ref 4.0–10.5)
WBC: 7.6 10*3/uL (ref 4.0–10.5)

## 2016-09-15 LAB — POCT I-STAT 3, VENOUS BLOOD GAS (G3P V)
Acid-Base Excess: 1 mmol/L (ref 0.0–2.0)
Acid-base deficit: 1 mmol/L (ref 0.0–2.0)
Bicarbonate: 24.6 mmol/L (ref 20.0–28.0)
Bicarbonate: 26.9 mmol/L (ref 20.0–28.0)
O2 Saturation: 69 %
O2 Saturation: 71 %
TCO2: 26 mmol/L (ref 0–100)
TCO2: 28 mmol/L (ref 0–100)
pCO2, Ven: 42.7 mmHg — ABNORMAL LOW (ref 44.0–60.0)
pCO2, Ven: 46.1 mmHg (ref 44.0–60.0)
pH, Ven: 7.368 (ref 7.250–7.430)
pH, Ven: 7.374 (ref 7.250–7.430)
pO2, Ven: 37 mmHg (ref 32.0–45.0)
pO2, Ven: 38 mmHg (ref 32.0–45.0)

## 2016-09-15 LAB — BASIC METABOLIC PANEL
Anion gap: 10 (ref 5–15)
BUN: 17 mg/dL (ref 6–20)
CO2: 26 mmol/L (ref 22–32)
Calcium: 9.5 mg/dL (ref 8.9–10.3)
Chloride: 98 mmol/L — ABNORMAL LOW (ref 101–111)
Creatinine, Ser: 1.22 mg/dL — ABNORMAL HIGH (ref 0.44–1.00)
GFR calc Af Amer: 50 mL/min — ABNORMAL LOW (ref >60)
GFR calc non Af Amer: 43 mL/min — ABNORMAL LOW (ref >60)
Glucose, Bld: 122 mg/dL — ABNORMAL HIGH (ref 65–99)
Potassium: 3.2 mmol/L — ABNORMAL LOW (ref 3.5–5.1)
Sodium: 134 mmol/L — ABNORMAL LOW (ref 135–145)

## 2016-09-15 LAB — CREATININE, SERUM
Creatinine, Ser: 0.98 mg/dL (ref 0.44–1.00)
GFR calc Af Amer: >60 mL/min (ref >60)
GFR calc non Af Amer: 56 mL/min — ABNORMAL LOW (ref >60)

## 2016-09-15 LAB — PROTIME-INR
INR: 0.99
Prothrombin Time: 13.1 seconds (ref 11.4–15.2)

## 2016-09-15 SURGERY — RIGHT/LEFT HEART CATH AND CORONARY ANGIOGRAPHY
Anesthesia: LOCAL

## 2016-09-15 MED ORDER — ACETAMINOPHEN 500 MG PO TABS
1000.0000 mg | ORAL_TABLET | Freq: Four times a day (QID) | ORAL | Status: DC | PRN
Start: 2016-09-15 — End: 2016-09-15
  Administered 2016-09-15: 1000 mg via ORAL
  Filled 2016-09-15: qty 2

## 2016-09-15 MED ORDER — SULFASALAZINE 500 MG PO TABS
1000.0000 mg | ORAL_TABLET | Freq: Two times a day (BID) | ORAL | Status: DC
  Administered 2016-09-15 – 2016-09-16 (×2): 1000 mg via ORAL
  Filled 2016-09-15 (×2): qty 2

## 2016-09-15 MED ORDER — HEPARIN SODIUM (PORCINE) 1000 UNIT/ML IJ SOLN
INTRAMUSCULAR | Status: AC
  Filled 2016-09-15: qty 1

## 2016-09-15 MED ORDER — HEPARIN SODIUM (PORCINE) 5000 UNIT/ML IJ SOLN: 5000.0000 [IU] | Freq: Three times a day (TID) | INTRAMUSCULAR | Status: DC

## 2016-09-15 MED ORDER — FENTANYL CITRATE (PF) 100 MCG/2ML IJ SOLN
INTRAMUSCULAR | Status: DC | PRN
  Administered 2016-09-15: 25 ug via INTRAVENOUS

## 2016-09-15 MED ORDER — VENLAFAXINE HCL ER 75 MG PO CP24
75.0000 mg | ORAL_CAPSULE | Freq: Every day | ORAL | Status: DC
  Administered 2016-09-16: 75 mg via ORAL
  Filled 2016-09-15: qty 1

## 2016-09-15 MED ORDER — LIDOCAINE HCL 1 % IJ SOLN
INTRAMUSCULAR | Status: AC
  Filled 2016-09-15: qty 20

## 2016-09-15 MED ORDER — HEPARIN SODIUM (PORCINE) 5000 UNIT/ML IJ SOLN
5000.0000 [IU] | Freq: Three times a day (TID) | INTRAMUSCULAR | Status: DC
  Filled 2016-09-15: qty 1

## 2016-09-15 MED ORDER — HEPARIN (PORCINE) IN NACL 2-0.9 UNIT/ML-% IJ SOLN
INTRAMUSCULAR | Status: AC | PRN
  Administered 2016-09-15: 1000 mL

## 2016-09-15 MED ORDER — ACETAMINOPHEN 500 MG PO TABS
ORAL_TABLET | ORAL | Status: AC
  Filled 2016-09-15: qty 2

## 2016-09-15 MED ORDER — HEPARIN SODIUM (PORCINE) 1000 UNIT/ML IJ SOLN
INTRAMUSCULAR | Status: DC | PRN
  Administered 2016-09-15: 3500 [IU] via INTRAVENOUS

## 2016-09-15 MED ORDER — VERAPAMIL HCL 2.5 MG/ML IV SOLN
INTRAVENOUS | Status: DC | PRN
  Administered 2016-09-15: 10 mL via INTRA_ARTERIAL

## 2016-09-15 MED ORDER — POTASSIUM CHLORIDE CRYS ER 20 MEQ PO TBCR
EXTENDED_RELEASE_TABLET | ORAL | Status: AC
  Administered 2016-09-15: 40 meq via ORAL
  Filled 2016-09-15: qty 2

## 2016-09-15 MED ORDER — IOPAMIDOL (ISOVUE-370) INJECTION 76%
INTRAVENOUS | Status: DC | PRN
  Administered 2016-09-15: 25 mL via INTRAVENOUS

## 2016-09-15 MED ORDER — POTASSIUM CHLORIDE CRYS ER 20 MEQ PO TBCR
40.0000 meq | EXTENDED_RELEASE_TABLET | Freq: Once | ORAL | Status: AC
  Administered 2016-09-15: 40 meq via ORAL

## 2016-09-15 MED ORDER — IOPAMIDOL (ISOVUE-370) INJECTION 76%
INTRAVENOUS | Status: AC
  Filled 2016-09-15: qty 100

## 2016-09-15 MED ORDER — SODIUM CHLORIDE 0.9% FLUSH: 3.0000 mL | INTRAVENOUS | Status: DC | PRN

## 2016-09-15 MED ORDER — CHLORTHALIDONE 25 MG PO TABS
25.0000 mg | ORAL_TABLET | Freq: Every day | ORAL | Status: DC
  Administered 2016-09-16: 25 mg via ORAL
  Filled 2016-09-15: qty 1

## 2016-09-15 MED ORDER — MIDAZOLAM HCL 2 MG/2ML IJ SOLN
INTRAMUSCULAR | Status: DC | PRN
Start: 2016-09-15 — End: 2016-09-15
  Administered 2016-09-15: 1 mg via INTRAVENOUS

## 2016-09-15 MED ORDER — POTASSIUM CHLORIDE CRYS ER 20 MEQ PO TBCR: 20.0000 meq | EXTENDED_RELEASE_TABLET | Freq: Every day | ORAL | Status: DC

## 2016-09-15 MED ORDER — ACETAMINOPHEN 325 MG PO TABS: 650.0000 mg | ORAL_TABLET | ORAL | Status: DC | PRN

## 2016-09-15 MED ORDER — ASPIRIN 81 MG PO CHEW
CHEWABLE_TABLET | ORAL | Status: AC
  Administered 2016-09-15: 81 mg via ORAL
  Filled 2016-09-15: qty 1

## 2016-09-15 MED ORDER — ATENOLOL-CHLORTHALIDONE 50-25 MG PO TABS: 1.0000 | ORAL_TABLET | Freq: Every day | ORAL | Status: DC

## 2016-09-15 MED ORDER — SODIUM CHLORIDE 0.9 % IV SOLN
INTRAVENOUS | Status: DC
  Administered 2016-09-15: 09:00:00 via INTRAVENOUS

## 2016-09-15 MED ORDER — ATENOLOL 50 MG PO TABS
50.0000 mg | ORAL_TABLET | Freq: Every day | ORAL | Status: DC
  Administered 2016-09-16: 50 mg via ORAL
  Filled 2016-09-15: qty 1

## 2016-09-15 MED ORDER — HEPARIN (PORCINE) IN NACL 2-0.9 UNIT/ML-% IJ SOLN
INTRAMUSCULAR | Status: AC
  Filled 2016-09-15: qty 500

## 2016-09-15 MED ORDER — MOMETASONE FURO-FORMOTEROL FUM 100-5 MCG/ACT IN AERO
2.0000 | INHALATION_SPRAY | Freq: Two times a day (BID) | RESPIRATORY_TRACT | Status: DC
  Filled 2016-09-15: qty 8.8

## 2016-09-15 MED ORDER — FENTANYL CITRATE (PF) 100 MCG/2ML IJ SOLN
INTRAMUSCULAR | Status: AC
  Filled 2016-09-15: qty 2

## 2016-09-15 MED ORDER — LIDOCAINE HCL (PF) 1 % IJ SOLN
INTRAMUSCULAR | Status: DC | PRN
  Administered 2016-09-15 (×2): 2 mL

## 2016-09-15 MED ORDER — MIDAZOLAM HCL 2 MG/2ML IJ SOLN
INTRAMUSCULAR | Status: AC
  Filled 2016-09-15: qty 2

## 2016-09-15 MED ORDER — VERAPAMIL HCL 2.5 MG/ML IV SOLN
INTRAVENOUS | Status: AC
  Filled 2016-09-15: qty 2

## 2016-09-15 MED ORDER — PANTOPRAZOLE SODIUM 40 MG PO TBEC
40.0000 mg | DELAYED_RELEASE_TABLET | Freq: Every day | ORAL | Status: DC
  Administered 2016-09-16: 40 mg via ORAL
  Filled 2016-09-15: qty 1

## 2016-09-15 MED ORDER — SODIUM CHLORIDE 0.9 % IV SOLN: 250.0000 mL | INTRAVENOUS | Status: DC | PRN

## 2016-09-15 MED ORDER — ONDANSETRON HCL 4 MG/2ML IJ SOLN: 4.0000 mg | Freq: Four times a day (QID) | INTRAMUSCULAR | Status: DC | PRN

## 2016-09-15 MED ORDER — TRAZODONE HCL 50 MG PO TABS
50.0000 mg | ORAL_TABLET | Freq: Every day | ORAL | Status: DC
  Administered 2016-09-15: 50 mg via ORAL
  Filled 2016-09-15: qty 1

## 2016-09-15 MED ORDER — CYCLOSPORINE 0.05 % OP EMUL
2.0000 [drp] | Freq: Two times a day (BID) | OPHTHALMIC | Status: DC
  Administered 2016-09-15 – 2016-09-16 (×2): 2 [drp] via OPHTHALMIC
  Filled 2016-09-15 (×2): qty 1

## 2016-09-15 MED ORDER — FOLIC ACID 1 MG PO TABS
1.0000 mg | ORAL_TABLET | Freq: Every day | ORAL | Status: DC
  Administered 2016-09-16: 1 mg via ORAL
  Filled 2016-09-15: qty 1

## 2016-09-15 MED ORDER — SODIUM CHLORIDE 0.9 % IV SOLN: INTRAVENOUS | Status: AC

## 2016-09-15 MED ORDER — ASPIRIN 81 MG PO CHEW
81.0000 mg | CHEWABLE_TABLET | ORAL | Status: AC
  Administered 2016-09-15: 81 mg via ORAL

## 2016-09-15 MED ORDER — ALBUTEROL SULFATE (2.5 MG/3ML) 0.083% IN NEBU: 2.5000 mg | INHALATION_SOLUTION | Freq: Four times a day (QID) | RESPIRATORY_TRACT | Status: DC | PRN

## 2016-09-15 MED ORDER — SODIUM CHLORIDE 0.9% FLUSH
3.0000 mL | Freq: Two times a day (BID) | INTRAVENOUS | Status: DC
  Administered 2016-09-15 – 2016-09-16 (×2): 3 mL via INTRAVENOUS

## 2016-09-15 MED ORDER — SODIUM CHLORIDE 0.9% FLUSH: 3.0000 mL | Freq: Two times a day (BID) | INTRAVENOUS | Status: DC

## 2016-09-15 SURGICAL SUPPLY — 12 items
CATH 5FR JL3.5 JR4 ANG PIG MP (CATHETERS) ×2 IMPLANT
CATH BALLN WEDGE 5F 110CM (CATHETERS) ×2 IMPLANT
DEVICE RAD COMP TR BAND LRG (VASCULAR PRODUCTS) ×2 IMPLANT
GLIDESHEATH SLEND SS 6F .021 (SHEATH) ×2 IMPLANT
GUIDEWIRE INQWIRE 1.5J.035X260 (WIRE) ×1 IMPLANT
INQWIRE 1.5J .035X260CM (WIRE) ×2
KIT HEART LEFT (KITS) ×2 IMPLANT
PACK CARDIAC CATHETERIZATION (CUSTOM PROCEDURE TRAY) ×2 IMPLANT
SHEATH GLIDE SLENDER 4/5FR (SHEATH) ×2 IMPLANT
TRANSDUCER W/STOPCOCK (MISCELLANEOUS) ×2 IMPLANT
TUBING CIL FLEX 10 FLL-RA (TUBING) ×2 IMPLANT
WIRE EMERALD 3MM-J .025X260CM (WIRE) ×2 IMPLANT

## 2016-09-15 NOTE — Interval H&P Note (Signed)
History and Physical Interval Note:  09/15/2016 11:17 AM  Kristen Gray  has presented today for surgery, with the diagnosis of hp  The various methods of treatment have been discussed with the patient and family. After consideration of risks, benefits and other options for treatment, the patient has consented to  Procedure(s): RIGHT/LEFT HEART CATH AND CORONARY ANGIOGRAPHY (N/A) as a surgical intervention .  The patient's history has been reviewed, patient examined, no change in status, stable for surgery.  I have reviewed the patient's chart and labs.  Questions were answered to the patient's satisfaction.     Viviano Bir [employer]

## 2016-09-15 NOTE — Progress Notes (Signed)
Pt educated about safety and importance of bed alarm during the night however pt refuses to be on bed alarm. Will continue to round on patient.   Lil Lepage, RN    

## 2016-09-15 NOTE — Progress Notes (Signed)
Patient arrived to [address] from cath lab. Patient alert and oriented x4. Patient denies pain 0/10. Sites without any complication, bleeding or brusing. Will continue to monitor.  Nickolai Rinks, RN

## 2016-09-15 NOTE — Progress Notes (Addendum)
Patient up to restroom with assistance.Excessive use of right hand was observed and patient teaching attempted. Patient suggested a sling but refused when application was attempted. Am reluctant to remove air from TR band except at very conservitive intervals and amounts. Will wait for a calmer time. TR band gradually defllated and removed by Sherlyn Lick. Site remains stable as of1820.

## 2016-09-15 NOTE — H&P (View-Only) (Signed)
PCP: Betty Martinique Pulmonology: Dr. Elsworth Soho Cardiology: [name]  71 yo with history of depression, HTN, and Crohns disease was referred by Dr. Elsworth Soho for evaluation of chronic exertional dyspnea.  She says that fatigue and dyspnea have been slowly progressive since 2013. They have been worse this year.  She is at the point where she is short of breath walking down the hall in her house.  She came in this morning on a wheelchair to avoid the walk.  She uses a cane.  She fatigues very easily and generally does very little activity.  Her weight has slowly increased.  No chest pain or tightness.  No orthopnea/PND.  She never smoked.  No palpitations or lightheadedness.   She had a Cardiolite in 4/17 that was normal.  Echo in 4/17 showed normal EF with mild-moderate AI, mild-moderate MR, moderate TR, and elevated PA pressure at 51 mmHg. PFTs in 9/17 were unremarkable and V/Q scan in 7/18 showed no acute or chronic PE.  She was evaluated in the pulmonary clinic and was sent here for evaluation for heart catheterization. There was no significant lung abnormality noted.  She was found to have mild OSA.   ECG (personally reviewed): short PR interval, otherwise normal.   Labs (4/18): hgb 12.5, TSH normal  PMH: 1. Depression 2. Crohns disease 3. GERD 4. H/o CCY 5. HTN 6. Chronic exertional dyspnea:  - Echo (4/17): EF 55%, mild LVH, mild to moderate AI, mild to moderate MR, moderate TR, PASP 51 mmHg.  - Lexiscan Cardiolite (4/17): EF 50%, no ischemia/infarction.  - V/Q scan (7/18): no evidence for chronic PE.  - PFTs (9/17): Unremarkable.  7. OSA: Mild.   SH: Lives alone in [place], widow.  No smoking or ETOH.  FH: Mother with rheumatic heart disease, by description she had mitral stenosis.  No other known heart disease.   ROS: All systems reviewed and negative except as per HPI.   Current Outpatient Prescriptions  Medication Sig Dispense Refill  . albuterol (PROAIR HFA) 108 (90 Base) MCG/ACT  inhaler Inhale 2 puffs into the lungs every 6 (six) hours as needed for wheezing or shortness of breath. 18 g 2  . atenolol-chlorthalidone (TENORETIC) 50-25 MG tablet Take 1 tablet by mouth daily. 30 tablet 2  . budesonide-formoterol (SYMBICORT) 80-4.5 MCG/ACT inhaler Inhale 2 puffs into the lungs 2 (two) times daily. 1 Inhaler 2  . calcium & magnesium carbonates (MYLANTA) 706-237 MG tablet 1000 mg 2 tablets    . Calcium-Vitamin D-Vitamin K (CALCIUM + D) [phone] MG-UNT-MCG CHEW 2 tabs    . folic acid (FOLVITE) 1 MG tablet 1 tablet    . omeprazole (PRILOSEC OTC) 20 MG tablet 1 tablet    . sulfaSALAzine (AZULFIDINE) 500 MG tablet TAKE TWO TABLETS BY MOUTH TWICE DAILY    . traZODone (DESYREL) 50 MG tablet TAKE 1 TABLET BY MOUTH AT BEDTIME 30 tablet 2  . venlafaxine XR (EFFEXOR XR) 75 MG 24 hr capsule Take 1 capsule (75 mg total) by mouth daily with breakfast. 30 capsule 3   No current facility-administered medications for this encounter.    BP 120/60   Pulse 70   Wt 179 lb (81.2 kg)   SpO2 96%   BMI 37.41 kg/m  General: NAD, obese Neck: No JVD, no thyromegaly or thyroid nodule.  Lungs: Clear to auscultation bilaterally with normal respiratory effort. CV: Nondisplaced PMI.  Heart regular S1/S2, no S3/S4, no murmur.  No peripheral edema.  No carotid bruit.  Normal pedal  pulses.  Abdomen: Soft, nontender, no hepatosplenomegaly, no distention.  Skin: Intact without lesions or rashes.  Neurologic: Alert and oriented x 3.  Psych: Normal affect. Extremities: No clubbing or cyanosis.  HEENT: Normal.   Assessment/Plan: 1. Chronic exertional dyspnea: Slowly progressive.  Pulmonary evaluation has not shown any  definite significant abnormalities (PFTs unremarkable, V/Q negative).  She has mild OSA.  I suspect obesity and deconditioning play a role.  She does not appear significantly volume overloaded by exam.  Echo from 4/17 did suggest elevated PA pressure and mid to moderate valvular heart  disease.  It is possible that symptoms could be from diastolic CHF, pulmonary hypertension, or undiagnosed coronary disease.  - I think that the next appropriate step here will be evaluation by right and left heart cath. I discussed this with her today and went over risks/benefits.  She agrees to proceed.  She will need to spend the night afterwards regardless of result as she has no one to stay with her at home.  - She will need repeat echo to reassess valvular heart disease.  - Check BNP, BMET, CBC, INR.  2. Obesity: If cardiac cath is unremarkable, she will need to start exercise/diet program.  3. HTN: BP is controlled.  4.Depression: This certainly could play a role in her chronic fatigue.  5. Pulmonary hypertension: Noted by echo. This could be pulmonary venous hypertension (group 2) from diastolic CHF, but cannot rule out pulmonary arterial hypertension without RHC.   [name] 08/31/2016

## 2016-09-16 ENCOUNTER — Encounter (HOSPITAL_COMMUNITY): Payer: Self-pay

## 2016-09-16 DIAGNOSIS — R0609 Other forms of dyspnea: Secondary | ICD-10-CM | POA: Diagnosis not present

## 2016-09-16 DIAGNOSIS — I272 Pulmonary hypertension, unspecified: Secondary | ICD-10-CM | POA: Diagnosis not present

## 2016-09-16 LAB — BASIC METABOLIC PANEL
Anion gap: 8 (ref 5–15)
BUN: 15 mg/dL (ref 6–20)
CO2: 26 mmol/L (ref 22–32)
Calcium: 9 mg/dL (ref 8.9–10.3)
Chloride: 104 mmol/L (ref 101–111)
Creatinine, Ser: 1.02 mg/dL — ABNORMAL HIGH (ref 0.44–1.00)
GFR calc Af Amer: >60 mL/min (ref >60)
GFR calc non Af Amer: 53 mL/min — ABNORMAL LOW (ref >60)
Glucose, Bld: 107 mg/dL — ABNORMAL HIGH (ref 65–99)
Potassium: 3.3 mmol/L — ABNORMAL LOW (ref 3.5–5.1)
Sodium: 138 mmol/L (ref 135–145)

## 2016-09-16 LAB — CBC
HCT: 32.8 % — ABNORMAL LOW (ref 36.0–46.0)
Hemoglobin: 10.5 g/dL — ABNORMAL LOW (ref 12.0–15.0)
MCH: 31 pg (ref 26.0–34.0)
MCHC: 32 g/dL (ref 30.0–36.0)
MCV: 96.8 fL (ref 78.0–100.0)
Platelets: 168 10*3/uL (ref 150–400)
RBC: 3.39 MIL/uL — ABNORMAL LOW (ref 3.87–5.11)
RDW: 14.1 % (ref 11.5–15.5)
WBC: 6.8 10*3/uL (ref 4.0–10.5)

## 2016-09-16 MED ORDER — POTASSIUM CHLORIDE CRYS ER 20 MEQ PO TBCR: 40.0000 meq | EXTENDED_RELEASE_TABLET | Freq: Every day | ORAL | 3 refills | Status: DC

## 2016-09-16 MED ORDER — POTASSIUM CHLORIDE CRYS ER 20 MEQ PO TBCR
40.0000 meq | EXTENDED_RELEASE_TABLET | Freq: Every day | ORAL | Status: DC
  Administered 2016-09-16: 40 meq via ORAL
  Filled 2016-09-16: qty 2

## 2016-09-16 NOTE — Discharge Summary (Signed)
Advanced Heart Failure Team  Discharge Summary   Patient ID: Kristen Gray MRN: [id number], DOB/AGE: 04/22/1945 71 y.o. Admit date: 09/15/2016 D/C date:     09/16/2016   Primary Discharge Diagnoses:  1. Dyspnea  2. Pulmonary HTN on ECHO 3. Hypokalemia 4. HTN    Hospital Course:    Mr Kristen Gray is 71 yo with progressive dyspnea of uncertain etiology.  She came for RHC/LHC and was admitted given no one at home to stay with her overnight.  RHC/LHC was relatively normal.  No significant coronary disease.  Filling pressures and PA pressure not significantly elevated. She has had a pulmonary workup that has been unremarkable. Symptoms are related in large part to obesity/deconditioning (limited by arthritis) and possibly depression.  Diastolic dysfunction may play a role, but based on filling pressures at [place] would not treat with Lasix. Patient noted to have frequent PACs so potassium was increased.   Plan to follow up in the HF clinic.   RHC 09/16/2016  RA mean 4 RV 40/8 PA 35/9, mean 23 PCWP mean 16 LV 135/15 AO 141/64 Oxygen saturations: PA 70% AO 98% Cardiac Output (Fick) 4.78  Cardiac Index (Fick) 2.75   Discharge Weight: 177 pounds.  Discharge Vitals: Blood pressure (!) 126/53, pulse 71, temperature 98.7 F (37.1 C), temperature source Oral, resp. rate 16, height 4' 10"  (1.473 m), weight 177 lb 8 oz (80.5 kg), SpO2 94 %.  Labs: Lab Results  Component Value Date   WBC 6.8 09/16/2016   HGB 10.5 (L) 09/16/2016   HCT 32.8 (L) 09/16/2016   MCV 96.8 09/16/2016   PLT 168 09/16/2016    Recent Labs Lab 09/16/16 0651  NA 138  K 3.3*  CL 104  CO2 26  BUN 15  CREATININE 1.02*  CALCIUM 9.0  GLUCOSE 107*   Lab Results  Component Value Date   CHOL 144 03/28/2015   HDL 61 03/28/2015   LDLCALC 51 03/28/2015   TRIG 159 03/28/2015   BNP (last 3 results)  Recent Labs  08/30/16 1234  BNP 69.1    ProBNP (last 3 results) No results for input(s): PROBNP in the last  8760 hours.   Diagnostic Studies/Procedures   No results found.  Discharge Medications   Allergies as of 09/16/2016      Reactions   Penicillin G Nausea Only   Has patient had a PCN reaction causing immediate rash, facial/tongue/throat swelling, SOB or lightheadedness with hypotension:No Has patient had a PCN reaction causing severe rash involving mucus membranes or skin necrosis: No Has patient had a PCN reaction that required hospitalization: No Has patient had a PCN reaction occurring within the last 10 years: No--NAUSEA ONLY If all of the above answers are "NO", then may proceed with Cephalosporin use.      Medication List    TAKE these medications   acetaminophen 500 MG tablet Commonly known as:  TYLENOL Take 1,000 mg by mouth every 6 (six) hours as needed (FOR PAIN.).   albuterol 108 (90 Base) MCG/ACT inhaler Commonly known as:  PROAIR HFA Inhale 2 puffs into the lungs every 6 (six) hours as needed for wheezing or shortness of breath.   atenolol-chlorthalidone 50-25 MG tablet Commonly known as:  TENORETIC Take 1 tablet by mouth daily.   budesonide-formoterol 80-4.5 MCG/ACT inhaler Commonly known as:  SYMBICORT Inhale 2 puffs into the lungs 2 (two) times daily.   CALTRATE 600+D PO Take 2 tablets by mouth daily.   cycloSPORINE 0.05 % ophthalmic emulsion Commonly  known as:  RESTASIS Place 2 drops into both eyes 2 (two) times daily.   folic acid 1 MG tablet Commonly known as:  FOLVITE Take 1 mg by mouth daily.   omeprazole 20 MG capsule Commonly known as:  PRILOSEC Take 20 mg by mouth daily before breakfast.   potassium chloride SA 20 MEQ tablet Commonly known as:  K-DUR,KLOR-CON Take 2 tablets (40 mEq total) by mouth daily. What changed:  how much to take   sulfaSALAzine 500 MG tablet Commonly known as:  AZULFIDINE TAKE TWO TABLETS (1000 MG) BY MOUTH TWICE DAILY   traZODone 50 MG tablet Commonly known as:  DESYREL TAKE 1 TABLET BY MOUTH AT BEDTIME    venlafaxine XR 75 MG 24 hr capsule Commonly known as:  EFFEXOR XR Take 1 capsule (75 mg total) by mouth daily with breakfast.            Discharge Care Instructions        Start     Ordered   09/16/16 0000  potassium chloride SA (K-DUR,KLOR-CON) 20 MEQ tablet  Daily    Question:  Supervising Provider  Answer:  Glori Bickers R   09/16/16 0930   09/16/16 0000  Diet - low sodium heart healthy     09/16/16 0930   09/16/16 0000  Increase activity slowly     09/16/16 0930      Disposition   The patient will be discharged in stable condition to home. Discharge Instructions    Diet - low sodium heart healthy    Complete by:  As directed    Increase activity slowly    Complete by:  As directed      Follow-up Information    Larey Dresser, MD Follow up on 09/29/2016.   Specialty:  Cardiology Why:  at 10:30 [id number]  Contact information: [place] [address] [phone]             Duration of Discharge Encounter: Greater than 35 minutes   Signed, Braya Habermehl NP-C  09/16/2016, 9:30 AM

## 2016-09-16 NOTE — Progress Notes (Signed)
Patient ID: Kristen Gray, female   DOB: [birth year], 71 y.o.   MRN: [id number]     Advanced Heart Failure Rounding Note  Primary Cardiologist: Aundra Dubin  Subjective:    No complaints overnight.  Had SVT noted in chart, reviewed telemetry and appears to be sinus with frequent PACs.   RHC/LHC Coronary Findings   Dominance: Left  Left Main  Short, no significant disease.  Left Anterior Descending  No significant disease.  Left Circumflex  Dominant vessel providing left-sided PDA. No significant disease.  Right Coronary Artery  Small nondominant vessel. No significant CAD.  Right Heart   Right Heart Pressures RHC Procedural Findings: Hemodynamics (mmHg) RA mean 4 RV 40/8 PA 35/9, mean 23 PCWP mean 16 LV 135/15 AO 141/64  Oxygen saturations: PA 70% AO 98%  Cardiac Output (Fick) 4.78  Cardiac Index (Fick) 2.75         Objective:   Weight Range: 177 lb 8 oz (80.5 kg) Body mass index is 37.1 kg/m.   Vital Signs:   Temp:  [98.2 F (36.8 C)-98.7 F (37.1 C)] 98.7 F (37.1 C) (08/24 0616) Pulse Rate:  [34-80] 71 (08/24 0128) Resp:  [10-27] 16 (08/24 0616) BP: (45-153)/(22-116) 126/53 (08/24 0616) SpO2:  [92 %-100 %] 94 % (08/24 0616) Weight:  [177 lb 8 oz (80.5 kg)-178 lb 12.8 oz (81.1 kg)] 177 lb 8 oz (80.5 kg) (08/24 0616) Last BM Date: 09/14/16  Weight change: Filed Weights   09/15/16 0851 09/15/16 2000 09/16/16 0616  Weight: 170 lb (77.1 kg) 178 lb 12.8 oz (81.1 kg) 177 lb 8 oz (80.5 kg)    Intake/Output:   Intake/Output Summary (Last 24 hours) at 09/16/16 0918 Last data filed at 09/16/16 0618  Gross per 24 hour  Intake              120 ml  Output             1800 ml  Net            -1680 ml      Physical Exam    General:  Well appearing. No resp difficulty HEENT: Normal Neck: Supple. JVP not elevated. Carotids 2+ bilat; no bruits. No lymphadenopathy or thyromegaly appreciated. Cor: PMI nondisplaced. Regular rate & rhythm. No rubs, gallops or  murmurs. Lungs: Clear Abdomen: Soft, nontender, nondistended. No hepatosplenomegaly. No bruits or masses. Good bowel sounds. Extremities: No cyanosis, clubbing, rash, edema. Right radial artery cath site benign.  Neuro: Alert & orientedx3, cranial nerves grossly intact. moves all 4 extremities w/o difficulty. Affect pleasant   Telemetry   Personally reviewed, NSR with PACs   Labs    CBC  Recent Labs  09/15/16 2007 09/16/16 0651  WBC 7.6 6.8  HGB 11.8* 10.5*  HCT 37.0 32.8*  MCV 97.1 96.8  PLT 223 409   Basic Metabolic Panel  Recent Labs  09/15/16 0857 09/15/16 2007 09/16/16 0651  NA 134*  --  138  K 3.2*  --  3.3*  CL 98*  --  104  CO2 26  --  26  GLUCOSE 122*  --  107*  BUN 17  --  15  CREATININE 1.22* 0.98 1.02*  CALCIUM 9.5  --  9.0   Liver Function Tests No results for input(s): AST, ALT, ALKPHOS, BILITOT, PROT, ALBUMIN in the last 72 hours. No results for input(s): LIPASE, AMYLASE in the last 72 hours. Cardiac Enzymes No results for input(s): CKTOTAL, CKMB, CKMBINDEX, TROPONINI in the last 72 hours.  BNP: BNP (last 3 results)  Recent Labs  08/30/16 1234  BNP 69.1    ProBNP (last 3 results) No results for input(s): PROBNP in the last 8760 hours.   D-Dimer No results for input(s): DDIMER in the last 72 hours. Hemoglobin A1C No results for input(s): HGBA1C in the last 72 hours. Fasting Lipid Panel No results for input(s): CHOL, HDL, LDLCALC, TRIG, CHOLHDL, LDLDIRECT in the last 72 hours. Thyroid Function Tests No results for input(s): TSH, T4TOTAL, T3FREE, THYROIDAB in the last 72 hours.  Invalid input(s): FREET3  Other results:   Imaging     No results found.   Medications:     Scheduled Medications: . atenolol  50 mg Oral Daily   And  . chlorthalidone  25 mg Oral Daily  . cycloSPORINE  2 drop Both Eyes BID  . folic acid  1 mg Oral Daily  . heparin  5,000 Units Subcutaneous Q8H  . mometasone-formoterol  2 puff Inhalation  BID  . pantoprazole  40 mg Oral Daily  . potassium chloride SA  40 mEq Oral Daily  . sodium chloride flush  3 mL Intravenous Q12H  . sulfaSALAzine  1,000 mg Oral BID  . traZODone  50 mg Oral QHS  . venlafaxine XR  75 mg Oral Q breakfast     Infusions: . sodium chloride       PRN Medications:  sodium chloride, acetaminophen, albuterol, ondansetron (ZOFRAN) IV, sodium chloride flush    Patient Profile   71 yo with progressive dyspnea of uncertain etiology.  She came for RHC/LHC yesterday and was admitted given no one at home to stay with her overnight.   Assessment/Plan   RHC/LHC was relatively normal.  No significant coronary disease.  Filling pressures and PA pressure not significantly elevated. She has had a pulmonary workup that has been unremarkable.  I think that her symptoms are related in large part to obesity/deconditioning (limited by arthritis) and possibly depression.  Diastolic dysfunction may play a role, but based on filling pressures at [place] would not treat with Lasix.   Patient noted to have frequent PACs.  K is low still, would increase home K to 40 mEq daily.   Can go home on her current home regimen except increase K to 40 mEq daily.    Length of Stay: 0   [name], MD  09/16/2016, 9:18 AM  Advanced Heart Failure Team Pager [phone] (M-F; [place])  Please contact [facility] for night-coverage after hours (4p -7a ) and weekends on [email or web address]

## 2016-09-16 NOTE — Progress Notes (Signed)
Per CCMD patient had episode of SVT's and HR went up to 150-160's. Patient asymptomatic. Walking to the bathroom in the moment when episode was noted. Patient does not report any discomfor. VS stable. Will continue to monitor.  Zahlia Deshazer, RN

## 2016-09-29 ENCOUNTER — Encounter (HOSPITAL_COMMUNITY): Payer: PPO | Admitting: Cardiology

## 2016-10-04 ENCOUNTER — Encounter (HOSPITAL_COMMUNITY): Payer: Self-pay | Admitting: Cardiology

## 2016-10-04 ENCOUNTER — Ambulatory Visit (HOSPITAL_COMMUNITY)
Admission: RE | Admit: 2016-10-04 | Discharge: 2016-10-04 | Disposition: A | Discharge status: Home / Self Care | Payer: PPO | Source: Ambulatory Visit | Attending: Cardiology | Admitting: Cardiology

## 2016-10-04 VITALS — BP 142/68 | HR 79 | Wt 176.8 lb

## 2016-10-04 DIAGNOSIS — F329 Major depressive disorder, single episode, unspecified: Secondary | ICD-10-CM | POA: Insufficient documentation

## 2016-10-04 DIAGNOSIS — E669 Obesity, unspecified: Secondary | ICD-10-CM | POA: Diagnosis not present

## 2016-10-04 DIAGNOSIS — G4733 Obstructive sleep apnea (adult) (pediatric): Secondary | ICD-10-CM | POA: Insufficient documentation

## 2016-10-04 DIAGNOSIS — Z6836 Body mass index (BMI) 36.0-36.9, adult: Secondary | ICD-10-CM | POA: Insufficient documentation

## 2016-10-04 DIAGNOSIS — R0609 Other forms of dyspnea: Secondary | ICD-10-CM | POA: Insufficient documentation

## 2016-10-04 DIAGNOSIS — I10 Essential (primary) hypertension: Secondary | ICD-10-CM | POA: Insufficient documentation

## 2016-10-04 DIAGNOSIS — K219 Gastro-esophageal reflux disease without esophagitis: Secondary | ICD-10-CM | POA: Insufficient documentation

## 2016-10-04 DIAGNOSIS — Z79899 Other long term (current) drug therapy: Secondary | ICD-10-CM | POA: Insufficient documentation

## 2016-10-04 DIAGNOSIS — K5090 Crohn's disease, unspecified, without complications: Secondary | ICD-10-CM | POA: Insufficient documentation

## 2016-10-04 DIAGNOSIS — R0600 Dyspnea, unspecified: Secondary | ICD-10-CM

## 2016-10-04 DIAGNOSIS — M1990 Unspecified osteoarthritis, unspecified site: Secondary | ICD-10-CM | POA: Insufficient documentation

## 2016-10-04 DIAGNOSIS — I272 Pulmonary hypertension, unspecified: Secondary | ICD-10-CM | POA: Diagnosis not present

## 2016-10-04 DIAGNOSIS — R5382 Chronic fatigue, unspecified: Secondary | ICD-10-CM | POA: Diagnosis not present

## 2016-10-04 NOTE — Patient Instructions (Signed)
High resolution CT chest   Your physician has requested that you have an echocardiogram. Echocardiography is a painless test that uses sound waves to create images of your heart. It provides your doctor with information about the size and shape of your heart and how well your heart's chambers and valves are working. This procedure takes approximately one hour. There are no restrictions for this procedure.  Your physician recommends that you schedule a follow-up appointment in: 3 months

## 2016-10-04 NOTE — Progress Notes (Signed)
PCP: Betty Martinique Pulmonology: Dr. Elsworth Soho Cardiology: [name]  71 yo with history of depression, HTN, and Crohns disease was referred by Dr. Elsworth Soho for evaluation of chronic exertional dyspnea.  She says that fatigue and dyspnea have been slowly progressive since 2013. They have been worse this year.  She never smoked.    She had a Cardiolite in 4/17 that was normal.  Echo in 4/17 showed normal EF with mild-moderate AI, mild-moderate MR, moderate TR, and elevated PA pressure at 51 mmHg. PFTs in 9/17 were unremarkable and V/Q scan in 7/18 showed no acute or chronic PE.  She was evaluated in the pulmonary clinic and was sent here for evaluation for heart catheterization. There was no significant lung abnormality noted.  She was found to have mild OSA.  I had her do a right and left heart cath in 8/18.  This showed no significant CAD, borderline elevated filling pressures, and no significant pulmonary hypertension.    She returns today for followup of dyspnea.  She feels about the same.  Still short of breath walking around her house and with ADLs.  No orthopnea/PND.  No chest pain.  She has significant arthritis pain in her right knee and right hip which limits her activity.  Weight is down 3 lbs.    Labs (4/18): hgb 12.5, TSH normal Labs (8/18): BNP 69, K 3.3, creatinine 1.02, hgb 10.5  PMH: 1. Depression 2. Crohns disease s/p partial colectomy.  3. GERD 4. H/o CCY 5. HTN 6. Chronic exertional dyspnea:  - Echo (4/17): EF 55%, mild LVH, mild to moderate AI, mild to moderate MR, moderate TR, PASP 51 mmHg.  - Lexiscan Cardiolite (4/17): EF 50%, no ischemia/infarction.  - V/Q scan (7/18): no evidence for chronic PE.  - PFTs (9/17): Unremarkable.  - LHC/RHC (8/18): No significant CAD; mean RA 4, PA 35/9 mean 23, mean PCWP 16, CI 2.75.  7. OSA: Mild.  8. Osteoarthritis  SH: Lives alone in [place], widow.  No smoking or ETOH.  FH: Mother with rheumatic heart disease, by description she had  mitral stenosis.  No other known heart disease.   ROS: All systems reviewed and negative except as per HPI.   Current Outpatient Prescriptions  Medication Sig Dispense Refill  . acetaminophen (TYLENOL) 500 MG tablet Take 1,000 mg by mouth every 6 (six) hours as needed (FOR PAIN.).    Marland Kitchen albuterol (PROAIR HFA) 108 (90 Base) MCG/ACT inhaler Inhale 2 puffs into the lungs every 6 (six) hours as needed for wheezing or shortness of breath. 18 g 2  . atenolol-chlorthalidone (TENORETIC) 50-25 MG tablet Take 1 tablet by mouth daily. 90 tablet 1  . budesonide-formoterol (SYMBICORT) 80-4.5 MCG/ACT inhaler Inhale 2 puffs into the lungs 2 (two) times daily. 1 Inhaler 2  . Calcium Carbonate-Vitamin D (CALTRATE 600+D PO) Take 2 tablets by mouth daily.    . cycloSPORINE (RESTASIS) 0.05 % ophthalmic emulsion Place 2 drops into both eyes 2 (two) times daily.    . folic acid (FOLVITE) 1 MG tablet Take 1 mg by mouth daily.    Marland Kitchen omeprazole (PRILOSEC) 20 MG capsule Take 20 mg by mouth daily before breakfast.    . potassium chloride SA (K-DUR,KLOR-CON) 20 MEQ tablet Take 2 tablets (40 mEq total) by mouth daily. 90 tablet 3  . sulfaSALAzine (AZULFIDINE) 500 MG tablet TAKE TWO TABLETS (1000 MG) BY MOUTH TWICE DAILY    . traZODone (DESYREL) 50 MG tablet TAKE 1 TABLET BY MOUTH AT BEDTIME 30 tablet  2  . venlafaxine XR (EFFEXOR XR) 75 MG 24 hr capsule Take 1 capsule (75 mg total) by mouth daily with breakfast. 30 capsule 3   No current facility-administered medications for this encounter.    BP (!) 142/68   Pulse 79   Wt 176 lb 12.8 oz (80.2 kg)   SpO2 96%   BMI 36.95 kg/m  General: NAD, overweight Neck: No JVD, no thyromegaly or thyroid nodule.  Lungs: Clear to auscultation bilaterally with normal respiratory effort. CV: Nondisplaced PMI.  Heart regular S1/S2, no S3/S4, 1/6 SEM RUSB.  No peripheral edema.  No carotid bruit.  Normal pedal pulses.  Abdomen: Soft, nontender, no hepatosplenomegaly, no distention.   Skin: Intact without lesions or rashes.  Neurologic: Alert and oriented x 3.  Psych: Normal affect. Extremities: No clubbing or cyanosis.  HEENT: Normal.   Assessment/Plan: 1. Chronic exertional dyspnea: Slowly progressive.  Pulmonary evaluation has not shown any  definite significant abnormalities (PFTs unremarkable, V/Q negative).  She has mild OSA.  She does not appear significantly volume overloaded by exam and BNP was normal in 8/18.  Echo from 4/17 did suggest elevated PA pressure and mid to moderate valvular heart disease. LHC/RHC in 8/18 showed no significant coronary disease, no significant pulmonary hypertension, and upper normal filling pressures.  I think that her symptoms may be primarily due to deconditioning with significant osteoarthritis and minimal activity due to arthritic pain.  However, I think that we should rule out interstitial lung disease with high resolution chest CT.  - High resolution CT chest to r/o ILD.  - She still need echo to followup on valvular disease.  2. Obesity: Ultimately, weight loss will likely help her symptoms.  This will be difficult, however, with severe knee/hip OA.   3. HTN: BP is controlled.  4.Depression: This certainly could play a role in her chronic fatigue. She is being treated for this.   Followup in 3 months  [name] 10/04/2016

## 2016-10-11 ENCOUNTER — Encounter (HOSPITAL_COMMUNITY): Payer: PPO | Admitting: Cardiology

## 2016-10-12 ENCOUNTER — Ambulatory Visit (HOSPITAL_BASED_OUTPATIENT_CLINIC_OR_DEPARTMENT_OTHER)
Admission: RE | Admit: 2016-10-12 | Discharge: 2016-10-12 | Disposition: A | Discharge status: Home / Self Care | Payer: PPO | Source: Ambulatory Visit

## 2016-10-12 ENCOUNTER — Ambulatory Visit (HOSPITAL_COMMUNITY)
Admission: RE | Admit: 2016-10-12 | Discharge: 2016-10-12 | Disposition: A | Discharge status: Home / Self Care | Payer: PPO | Source: Ambulatory Visit | Attending: Cardiology | Admitting: Cardiology

## 2016-10-12 DIAGNOSIS — I272 Pulmonary hypertension, unspecified: Secondary | ICD-10-CM

## 2016-10-12 DIAGNOSIS — I080 Rheumatic disorders of both mitral and aortic valves: Secondary | ICD-10-CM | POA: Diagnosis not present

## 2016-10-12 DIAGNOSIS — I700 Atherosclerosis of aorta: Secondary | ICD-10-CM | POA: Diagnosis not present

## 2016-10-12 DIAGNOSIS — I420 Dilated cardiomyopathy: Secondary | ICD-10-CM | POA: Insufficient documentation

## 2016-10-12 DIAGNOSIS — R911 Solitary pulmonary nodule: Secondary | ICD-10-CM | POA: Insufficient documentation

## 2016-10-12 LAB — ECHOCARDIOGRAM COMPLETE
E decel time: 194 msec
E/e' ratio: 10.89
FS: 28 % (ref 28–44)
IVS/LV PW RATIO, ED: 0.98
LA ID, A-P, ES: 41 mm
LA diam end sys: 41 mm
LA diam index: 2.37 cm/m2
LA vol A4C: 51.1 ml
LA vol index: 30.1 mL/m2
LA vol: 52.1 mL
LV E/e' medial: 10.89
LV E/e'average: 10.89
LV PW d: 8.71 mm — AB (ref 0.6–1.1)
LV e' LATERAL: 9.46 cm/s
LVOT SV: 63 mL
LVOT VTI: 22.3 cm
LVOT area: 2.84 cm2
LVOT diameter: 19 mm
LVOT peak grad rest: 3 mmHg
LVOT peak vel: 92.8 cm/s
Lateral S' vel: 12 cm/s
MV Dec: 194
MV Peak grad: 4 mmHg
MV pk A vel: 73.7 m/s
MV pk E vel: 103 m/s
P 1/2 time: 438 ms
RV sys press: 37 mmHg
Reg peak vel: 292 cm/s
TAPSE: 19.9 mm
TDI e' lateral: 9.46
TDI e' medial: 6.31
TR max vel: 292 cm/s

## 2016-10-12 NOTE — Progress Notes (Signed)
  Echocardiogram 2D Echocardiogram has been performed.  Kristen Gray 10/12/2016, 3:47 PM

## 2016-10-13 ENCOUNTER — Encounter: Payer: Self-pay | Admitting: Family Medicine

## 2016-10-13 ENCOUNTER — Telehealth (HOSPITAL_COMMUNITY): Payer: Self-pay

## 2016-10-13 NOTE — Telephone Encounter (Signed)
Notes recorded by Shirley Muscat, RN on 10/13/2016 at 2:30 PM EDT Pt aware of results  ------  Notes recorded by Larey Dresser, MD on 10/13/2016 at 10:51 AM EDT No ILD or emphysema noted. Small lung nodule, recommend followup noncontrast CT in 1 year. Nothing to explain dyspnea.

## 2016-10-14 ENCOUNTER — Other Ambulatory Visit: Payer: Self-pay | Admitting: Pulmonary Disease

## 2016-10-16 NOTE — Progress Notes (Deleted)
HPI:   [name] is a 71 y.o. female, who is here today to follow on some chronic medical problems.  Since her last OV she has followed with cardiologists, Dr Marigene Ehlers. She has also sen ortho, Dr Erlinda Hong.  Hx of exertional dyspnea, cardiac work up otherwise negative.  Cardiac cath on 09/15/16: 1. Borderline elevated left heart filling pressure with normal RV filling pressure.  2. Borderline pulmonary hypertension.  3. Normal coronaries.   Echo 09/2016: Left ventricle: The cavity size was normal. Systolic function was normal. The estimated ejection fraction was in the range of 55% to 60%. Wall motion was normal; there were no regional wall motion abnormalities. Left ventricular diastolic function parameters were normal. - Aortic valve: There was mild regurgitation. - Mitral valve: There was mild regurgitation. - Left atrium: The atrium was mildly dilated. - Atrial septum: No defect or patent foramen ovale was identified. - Pulmonary arteries: PA peak pressure: 37 mm Hg (S).  She also followed with Dr Elsworth Soho, pulmonologist. Chest CT 10/13/16: 1. No evidence of interstitial lung disease. 2. Tiny 2 mm right lower lobe subpleural pulmonary nodule, probable benign. No follow-up needed if patient is low-risk.    Depression: Currently she is on Effexor ER 75 mg daily.  Insomnia:  She is on Trazodone 50 mg.  Sleeping about *** hours.  HTN:  She is on Tenoretic Chorthalidone 50-25 mg daily.  BP readings at home: *** Last eye exam: *** Denies severe/frequent headache, visual changes, chest pain, palpitation, claudication, focal weakness, or edema.  Lab Results  Component Value Date   CREATININE 1.02 (H) 09/16/2016   BUN 15 09/16/2016   NA 138 09/16/2016   K 3.3 (L) 09/16/2016   CL 104 09/16/2016   CO2 26 09/16/2016     Concerns today: ***   Review of Systems    Current Outpatient Prescriptions on File Prior to Visit  Medication Sig Dispense Refill  .  acetaminophen (TYLENOL) 500 MG tablet Take 1,000 mg by mouth every 6 (six) hours as needed (FOR PAIN.).    Marland Kitchen albuterol (PROAIR HFA) 108 (90 Base) MCG/ACT inhaler Inhale 2 puffs into the lungs every 6 (six) hours as needed for wheezing or shortness of breath. 18 g 2  . atenolol-chlorthalidone (TENORETIC) 50-25 MG tablet Take 1 tablet by mouth daily. 90 tablet 1  . budesonide-formoterol (SYMBICORT) 80-4.5 MCG/ACT inhaler Inhale 2 puffs into the lungs 2 (two) times daily. 1 Inhaler 2  . Calcium Carbonate-Vitamin D (CALTRATE 600+D PO) Take 2 tablets by mouth daily.    . cycloSPORINE (RESTASIS) 0.05 % ophthalmic emulsion Place 2 drops into both eyes 2 (two) times daily.    . folic acid (FOLVITE) 1 MG tablet Take 1 mg by mouth daily.    Marland Kitchen omeprazole (PRILOSEC) 20 MG capsule Take 20 mg by mouth daily before breakfast.    . potassium chloride SA (K-DUR,KLOR-CON) 20 MEQ tablet Take 2 tablets (40 mEq total) by mouth daily. 90 tablet 3  . sulfaSALAzine (AZULFIDINE) 500 MG tablet TAKE TWO TABLETS (1000 MG) BY MOUTH TWICE DAILY    . traZODone (DESYREL) 50 MG tablet TAKE 1 TABLET BY MOUTH AT BEDTIME 30 tablet 2  . venlafaxine XR (EFFEXOR XR) 75 MG 24 hr capsule Take 1 capsule (75 mg total) by mouth daily with breakfast. 30 capsule 3   No current facility-administered medications on file prior to visit.      Past Medical History:  Diagnosis Date  . Anemia   .  Anxiety   . Arthritis   . Crohn's disease ([place])   . Depression   . GERD (gastroesophageal reflux disease)   . Heart murmur   . Hypertension   . Reflux    Allergies  Allergen Reactions  . Penicillin G Nausea Only    Has patient had a PCN reaction causing immediate rash, facial/tongue/throat swelling, SOB or lightheadedness with hypotension:No Has patient had a PCN reaction causing severe rash involving mucus membranes or skin necrosis: No Has patient had a PCN reaction that required hospitalization: No Has patient had a PCN reaction occurring  within the last 10 years: No--NAUSEA ONLY If all of the above answers are "NO", then may proceed with Cephalosporin use.     Social History   Social History  . Marital status: Widowed    Spouse name: N/A  . Number of children: N/A  . Years of education: N/A   Social History Main Topics  . Smoking status: Never Smoker  . Smokeless tobacco: Never Used  . Alcohol use No  . Drug use: No  . Sexual activity: Not Currently   Other Topics Concern  . Not on file   Social History Narrative  . No narrative on file    There were no vitals filed for this visit. There is no height or weight on file to calculate BMI.      Physical Exam    ASSESSMENT AND PLAN:     Diagnoses and all orders for this visit:  Psychophysiological insomnia  Generalized anxiety disorder  Hypertension, essential  Moderate episode of recurrent major depressive disorder (HCC)  BMI 40.0-44.9, adult ([place])           -Ms. Kristen Gray was advised to return sooner than planned today if new concerns arise.       Kristen Gray G. Martinique, MD  [facility]. [place] office.

## 2016-10-17 ENCOUNTER — Ambulatory Visit: Payer: PPO | Admitting: Family Medicine

## 2016-10-20 DIAGNOSIS — D649 Anemia, unspecified: Secondary | ICD-10-CM | POA: Insufficient documentation

## 2016-10-20 NOTE — Progress Notes (Signed)
HPI:   Kristen Gray is a 71 y.o. female, who is here today to follow on some chronic medical problems.  Since her last OV she has seen Dr McLein,cardiologists. She was hospitalized from 09/15/16 to 09/16/16 because dyspnea. Dx with pulmonary HT on echo, 10/12/16. Echo: Left ventricle: The cavity size was normal. Systolic function was normal. The estimated ejection fraction was in the range of 55% to 60%. Wall motion was normal; there were no regional wall motion abnormalities. Left ventricular diastolic function parameters were normal. - Aortic valve: There was mild regurgitation. - Mitral valve: There was mild regurgitation. - Left atrium: The atrium was mildly dilated. - Atrial septum: No defect or patent foramen ovale was identified. - Pulmonary arteries: PA peak pressure: 37 mm Hg (S).  10/13/16 chest CT 1. No evidence of interstitial lung disease. 2. Tiny 2 mm right lower lobe subpleural pulmonary nodule, probably benign. No follow-up needed if patient is low-risk. Non-contrast chest CT can be considered in 12 months if patient is high-risk.  Hypertension:    Currently on Tenoretic 50-25 mg daily.  Last eye exam: within the past year. BP reading at home: Not checking  She is taking medications as instructed, no side effects reported.  She has not noted unusual headache, visual changes, exertional chest pain, focal weakness, or edema.    Lab Results  Component Value Date   CREATININE 1.02 (H) 09/16/2016   BUN 15 09/16/2016   NA 138 09/16/2016   K 3.3 (L) 09/16/2016   CL 104 09/16/2016   CO2 26 09/16/2016  HypoK+ on KDUR 20 meq 2 tabs daily.  Insomnia: She is on trazodone 50 mg  Sleeping about 6 hours. She still does not feel rested next day. Fatigue is unchanged.  Recently she had sleep study, she is not aware of results yet.  Exertional dyspnea stable.  She has Albuterol inh to use as needed. She is also using Symbicort 80-4.5 mcg bid. She denies  cough,wheezing,or chest discomfort.  She has followed with pulmonologist and cardiologists,according to pr, "nothing is wrong" with her but she still feels fatigue and has dyspnea.   Depression:  Effexor XR 75 75 mg daily. Today she tells me that medication has not made a major difference in her mood. She has tried other medications,including Celexa. She states that the main reason she feels sad is because her husband is not longer with her, he died a few years ago. She does not think she would ever stop missing him.  She denies suicidal thought.  Anemia: Chronic.  Lab Results  Component Value Date   WBC 6.8 09/16/2016   HGB 10.5 (L) 09/16/2016   HCT 32.8 (L) 09/16/2016   MCV 96.8 09/16/2016   PLT 168 09/16/2016   Colonoscopy , she does not want another one. Follows periodically with GI. Hx of IBD,crohn's disease. She has not noted blood in her stool.  She is also complaining about a "bump", which she thinks is a "boil." She has a lesion on her back for a few years now, she denies any pain or drainage. She has had similar lesions in the past, which have drained spontaneously.    Review of Systems  Constitutional: Positive for fatigue. Negative for activity change, appetite change and fever.  HENT: Negative for mouth sores, nosebleeds and trouble swallowing.   Eyes: Negative for redness and visual disturbance.  Respiratory: Positive for shortness of breath. Negative for cough and wheezing.   Cardiovascular: Negative  for chest pain, palpitations and leg swelling.  Gastrointestinal: Negative for abdominal pain, blood in stool, nausea and vomiting.       Negative for changes in bowel habits.  Endocrine: Negative for cold intolerance and heat intolerance.  Genitourinary: Negative for decreased urine volume, dysuria and hematuria.  Musculoskeletal: Positive for back pain and gait problem.  Neurological: Negative for syncope, weakness and headaches.  Psychiatric/Behavioral:  Positive for sleep disturbance. Negative for confusion, hallucinations and suicidal ideas. The patient is nervous/anxious.      Current Outpatient Prescriptions on File Prior to Visit  Medication Sig Dispense Refill  . acetaminophen (TYLENOL) 500 MG tablet Take 1,000 mg by mouth every 6 (six) hours as needed (FOR PAIN.).    Marland Kitchen albuterol (PROAIR HFA) 108 (90 Base) MCG/ACT inhaler Inhale 2 puffs into the lungs every 6 (six) hours as needed for wheezing or shortness of breath. 18 g 2  . atenolol-chlorthalidone (TENORETIC) 50-25 MG tablet Take 1 tablet by mouth daily. 90 tablet 1  . budesonide-formoterol (SYMBICORT) 80-4.5 MCG/ACT inhaler Inhale 2 puffs into the lungs 2 (two) times daily. 1 Inhaler 2  . Calcium Carbonate-Vitamin D (CALTRATE 600+D PO) Take 2 tablets by mouth daily.    . cycloSPORINE (RESTASIS) 0.05 % ophthalmic emulsion Place 2 drops into both eyes 2 (two) times daily.    . folic acid (FOLVITE) 1 MG tablet Take 1 mg by mouth daily.    Marland Kitchen omeprazole (PRILOSEC) 20 MG capsule Take 20 mg by mouth daily before breakfast.    . potassium chloride SA (K-DUR,KLOR-CON) 20 MEQ tablet Take 2 tablets (40 mEq total) by mouth daily. 90 tablet 3  . sulfaSALAzine (AZULFIDINE) 500 MG tablet TAKE TWO TABLETS (1000 MG) BY MOUTH TWICE DAILY    . traZODone (DESYREL) 50 MG tablet TAKE 1 TABLET BY MOUTH AT BEDTIME 30 tablet 0  . venlafaxine XR (EFFEXOR XR) 75 MG 24 hr capsule Take 1 capsule (75 mg total) by mouth daily with breakfast. 30 capsule 3   No current facility-administered medications on file prior to visit.      Past Medical History:  Diagnosis Date  . Anemia   . Anxiety   . Arthritis   . Crohn's disease ([place])   . Depression   . GERD (gastroesophageal reflux disease)   . Heart murmur   . Hypertension   . Reflux    Allergies  Allergen Reactions  . Penicillin G Nausea Only    Has patient had a PCN reaction causing immediate rash, facial/tongue/throat swelling, SOB or lightheadedness  with hypotension:No Has patient had a PCN reaction causing severe rash involving mucus membranes or skin necrosis: No Has patient had a PCN reaction that required hospitalization: No Has patient had a PCN reaction occurring within the last 10 years: No--NAUSEA ONLY If all of the above answers are "NO", then may proceed with Cephalosporin use.     Social History   Social History  . Marital status: Widowed    Spouse name: N/A  . Number of children: N/A  . Years of education: N/A   Social History Main Topics  . Smoking status: Never Smoker  . Smokeless tobacco: Never Used  . Alcohol use No  . Drug use: No  . Sexual activity: Not Currently   Other Topics Concern  . None   Social History Narrative  . None    Vitals:   10/21/16 1500  BP: 130/70  Pulse: 99  Resp: 12  SpO2: 96%   Body mass index  is 36.81 kg/m.  Wt Readings from Last 3 Encounters:  10/21/16 176 lb 2 oz (79.9 kg)  10/04/16 176 lb 12.8 oz (80.2 kg)  09/16/16 177 lb 8 oz (80.5 kg)     Physical Exam  Nursing note and vitals reviewed. Constitutional: She is oriented to person, place, and time. She appears well-developed. No distress.  HENT:  Head: Normocephalic and atraumatic.  Mouth/Throat: Oropharynx is clear and moist and mucous membranes are normal.  Eyes: Pupils are equal, round, and reactive to light. Conjunctivae are normal.  Cardiovascular: Normal rate and regular rhythm.   No murmur heard. Pulses:      Dorsalis pedis pulses are 2+ on the right side, and 2+ on the left side.  Respiratory: Effort normal and breath sounds normal. No respiratory distress.  GI: Soft. She exhibits no mass. There is no hepatomegaly. There is no tenderness.  Musculoskeletal: She exhibits no edema or tenderness.  Lymphadenopathy:    She has no cervical adenopathy.  Neurological: She is alert and oriented to person, place, and time. She has normal strength. Coordination normal.  Stable with cane.  Skin: Skin is warm.  Lesion noted. No rash noted. No erythema.     2 cm nodular lesion with dark center. Not tender, not erythematous.   Psychiatric: Her mood appears anxious. Her affect is labile. She expresses no suicidal ideation.  Fairly groomed, good eye contact.    ASSESSMENT AND PLAN:   Kristen Gray was seen today for follow-up.  Diagnoses and all orders for this visit:  Lab Results  Component Value Date   CREATININE 1.25 (H) 10/21/2016   BUN 17 10/21/2016   NA 137 10/21/2016   K 4.6 10/21/2016   CL 99 10/21/2016   CO2 29 10/21/2016    Dyspnea on exertion  Stable overall. She is frustrated because so far work up is otherwise negative. We discussed other possible causes, ? Deconditioning. She does not want to stop Symbicort.  She has appt with Dr Sela Hilding in 12/2016, I recommend keeping appt.  Insomnia, unspecified type  Improved with Trazodone. No changes in current management. Good sleep hygiene. F/U in 5 months.  Hypertension, essential  Adequately controlled. No changes in current management. DASH diet recommended. Eye exam recommended annually. F/U in 5 months, before if needed.  -     Basic metabolic panel  Anemia, unspecified type  Chronic. This could contribute to her fatigue but has been stable. We will plan on re-checking next OV.  Hypokalemia  No changes in current management, will follow labs done today and will give further recommendations accordingly.  Moderate episode of recurrent major depressive disorder ([place])  Stable, she does not feel like medication is helping but she does not want to stop Effexor.  No changes in current management. Instructed about warning signs.  F/U in 5 months.  Sebaceous cyst  Asymptomatic. Educated about Dx.  She is not interested in having lesion removed.  Need for prophylactic vaccination and inoculation against influenza -     Flu vaccine HIGH DOSE PF      -Kristen Gray was advised to return sooner than  planned today if new concerns arise.       Kristen Pelphrey G. Martinique, MD  [facility]. [place] office.

## 2016-10-21 ENCOUNTER — Encounter: Payer: Self-pay | Admitting: Family Medicine

## 2016-10-21 ENCOUNTER — Ambulatory Visit (INDEPENDENT_AMBULATORY_CARE_PROVIDER_SITE_OTHER): Payer: PPO | Admitting: Family Medicine

## 2016-10-21 VITALS — BP 130/70 | HR 99 | Resp 12 | Ht <= 58 in | Wt 176.1 lb

## 2016-10-21 DIAGNOSIS — R0609 Other forms of dyspnea: Secondary | ICD-10-CM | POA: Diagnosis not present

## 2016-10-21 DIAGNOSIS — I10 Essential (primary) hypertension: Secondary | ICD-10-CM | POA: Diagnosis not present

## 2016-10-21 DIAGNOSIS — G4700 Insomnia, unspecified: Secondary | ICD-10-CM

## 2016-10-21 DIAGNOSIS — E876 Hypokalemia: Secondary | ICD-10-CM

## 2016-10-21 DIAGNOSIS — D649 Anemia, unspecified: Secondary | ICD-10-CM | POA: Diagnosis not present

## 2016-10-21 DIAGNOSIS — L723 Sebaceous cyst: Secondary | ICD-10-CM

## 2016-10-21 DIAGNOSIS — Z23 Encounter for immunization: Secondary | ICD-10-CM | POA: Diagnosis not present

## 2016-10-21 DIAGNOSIS — R0600 Dyspnea, unspecified: Secondary | ICD-10-CM

## 2016-10-21 DIAGNOSIS — F331 Major depressive disorder, recurrent, moderate: Secondary | ICD-10-CM

## 2016-10-21 LAB — BASIC METABOLIC PANEL
BUN/Creatinine Ratio: 14 (calc) (ref 6–22)
BUN: 17 mg/dL (ref 7–25)
CO2: 29 mmol/L (ref 20–32)
Calcium: 9.6 mg/dL (ref 8.6–10.4)
Chloride: 99 mmol/L (ref 98–110)
Creat: 1.25 mg/dL — ABNORMAL HIGH (ref 0.60–0.93)
Glucose, Bld: 152 mg/dL — ABNORMAL HIGH (ref 65–99)
Potassium: 4.6 mmol/L (ref 3.5–5.3)
Sodium: 137 mmol/L (ref 135–146)

## 2016-10-21 NOTE — Patient Instructions (Signed)
A few things to remember from today's visit:   Psychophysiological insomnia  Hypertension, essential - Plan: Basic metabolic panel  Generalized anxiety disorder  Dyspnea on exertion  Anemia, unspecified type  Need for prophylactic vaccination and inoculation against influenza - Plan: Flu vaccine HIGH DOSE PF   Kristen Gray, today we have followed on some of your chronic medical problems and they seem to be stable, so no changes in current management today.  Review medication list and be sure it is accurate.  -Remember a healthy diet and regular physical activity are very important for prevention as well as for well being; they also help with many chronic problems, decreasing the need of adding new medications and delaying or preventing possible complications.  I will see you back in 4 months.  Remember to arrange your follow up appt before leaving today.  Please follow sooner than planned if a new concern arises.   Please be sure medication list is accurate. If a new problem present, please set up appointment sooner than planned today.

## 2016-10-23 ENCOUNTER — Encounter: Payer: Self-pay | Admitting: Family Medicine

## 2016-11-07 ENCOUNTER — Other Ambulatory Visit: Payer: Self-pay | Admitting: Family Medicine

## 2016-11-07 DIAGNOSIS — F331 Major depressive disorder, recurrent, moderate: Secondary | ICD-10-CM

## 2016-12-13 ENCOUNTER — Other Ambulatory Visit: Payer: Self-pay | Admitting: Pulmonary Disease

## 2016-12-26 ENCOUNTER — Other Ambulatory Visit: Payer: Self-pay | Admitting: Family Medicine

## 2017-01-05 ENCOUNTER — Encounter (HOSPITAL_COMMUNITY): Payer: PPO | Admitting: Cardiology

## 2017-01-12 ENCOUNTER — Other Ambulatory Visit: Payer: Self-pay | Admitting: Pulmonary Disease

## 2017-01-27 ENCOUNTER — Ambulatory Visit (INDEPENDENT_AMBULATORY_CARE_PROVIDER_SITE_OTHER): Payer: PPO | Admitting: Orthopaedic Surgery

## 2017-01-31 ENCOUNTER — Ambulatory Visit (INDEPENDENT_AMBULATORY_CARE_PROVIDER_SITE_OTHER): Payer: PPO | Admitting: Orthopaedic Surgery

## 2017-01-31 ENCOUNTER — Ambulatory Visit (INDEPENDENT_AMBULATORY_CARE_PROVIDER_SITE_OTHER): Payer: PPO

## 2017-01-31 ENCOUNTER — Encounter (INDEPENDENT_AMBULATORY_CARE_PROVIDER_SITE_OTHER): Payer: Self-pay | Admitting: Orthopaedic Surgery

## 2017-01-31 DIAGNOSIS — G8929 Other chronic pain: Secondary | ICD-10-CM

## 2017-01-31 DIAGNOSIS — M25561 Pain in right knee: Secondary | ICD-10-CM

## 2017-01-31 DIAGNOSIS — M1711 Unilateral primary osteoarthritis, right knee: Secondary | ICD-10-CM | POA: Insufficient documentation

## 2017-01-31 MED ORDER — METHYLPREDNISOLONE ACETATE 40 MG/ML IJ SUSP
40.0000 mg | INTRAMUSCULAR | Status: AC | PRN
  Administered 2017-01-31: 40 mg via INTRA_ARTICULAR

## 2017-01-31 MED ORDER — BUPIVACAINE HCL 0.25 % IJ SOLN
2.0000 mL | INTRAMUSCULAR | Status: AC | PRN
  Administered 2017-01-31: 2 mL via INTRA_ARTICULAR

## 2017-01-31 MED ORDER — LIDOCAINE HCL 1 % IJ SOLN
2.0000 mL | INTRAMUSCULAR | Status: AC | PRN
  Administered 2017-01-31: 2 mL

## 2017-01-31 NOTE — Progress Notes (Signed)
Office Visit Note   Patient: Kristen Gray           Date of Birth: March 22, 1945           MRN: [id number] Visit Date: 01/31/2017              Requested by: Martinique, Betty G, MD [address] [place], [place] [address] PCP: Martinique, Betty G, MD   Assessment & Plan: Visit Diagnoses:  1. Unilateral primary osteoarthritis, right knee   2. Chronic pain of right knee     Plan: At this point Kristen Gray would like to proceed with another intra-articular cortisone injection she is trying to figure out the logistics of proceeding with a total knee arthroplasty.  She will follow-up with [place] once she is ready to proceed with surgical intervention.  Follow-Up Instructions: Return if symptoms worsen or fail to improve.   Orders:  Orders Placed This Encounter  Procedures  . Large Joint Inj: R knee  . XR KNEE 3 VIEW RIGHT   No orders of the defined types were placed in this encounter.     Procedures: Large Joint Inj: R knee on 01/31/2017 4:30 PM Indications: pain Details: 22 G needle, anterolateral approach Medications: 2 mL lidocaine 1 %; 2 mL bupivacaine 0.25 %; 40 mg methylPREDNISolone acetate 40 MG/ML      Clinical Data: No additional findings.   Subjective: Chief Complaint  Patient presents with  . Right Knee - Pain    HPI Kristen Gray comes in with recurrent right knee pain.  History of osteoarthritis which is been ongoing for the past several years.  She has been treated in the past with cortisone injections.  Last cortisone injection was several years ago which did last for a while.  Majority of her pain is anteromedial.  Worse with walking as well as increased activity.  She does note increased weakness as well to the left knee.  She has fallen several times due to this.  She does note that the weakness is preceded by sharp shooting pain.  She is also aware that she is in need of a total knee replacement but is trying to figure out the logistics.  Review of Systems as detailed  in HPI, all others reviewed and are negative.   Objective: Vital Signs: There were no vitals taken for this visit.  Physical Exam well-developed well-nourished female in no acute distress.  Alert and oriented x3. Ortho Exam examination of the right knee reveals a trace effusion.  Range of motion 0-95 degrees marked medial joint line tenderness moderate patellofemoral crepitus.  Increased Q angle with tracking and tethering.  Negative straight leg raise positive logroll.  She is resting intact distally.  Specialty Comments:  No specialty comments available.  Imaging: Xr Knee 3 View Right  Result Date: 01/31/2017 3 views of the right knee reveal marked degenerative changes medial patellofemoral compartments with periarticular spurring laterally.   PMFS History: Patient Active Problem List   Diagnosis Date Noted  . Unilateral primary osteoarthritis, right knee 01/31/2017  . Sebaceous cyst 10/21/2016  . Anemia 10/20/2016  . Primary osteoarthritis of right hip 09/12/2016  . Pulmonary hypertension ([place]) 07/26/2016  . OSA (obstructive sleep apnea) 07/26/2016  . BMI 40.0-44.9, adult ([place]) 04/14/2016  . Hypertension, essential 04/13/2016  . Vitamin D deficiency 04/13/2016  . Insomnia 04/13/2016  . Generalized anxiety disorder 04/13/2016  . 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 Osteoarthritis, 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/13/2016  . Dyspnea 10/07/2015   Past Medical History:  Diagnosis  Date  . Anemia   . Anxiety   . Arthritis   . Crohn's disease ([place])   . Depression   . GERD (gastroesophageal reflux disease)   . Heart murmur   . Hypertension   . Reflux     Family History  Problem Relation Age of Onset  . Heart disease Mother        RF  . Stroke Father   . Diabetes Father   . Hyperlipidemia Father   . Hypertension Father     Past Surgical History:  Procedure Laterality Date  . ANKLE SURGERY    . CARPAL TUNNEL RELEASE    . CHOLECYSTECTOMY    . COLON SURGERY    . RIGHT/LEFT HEART CATH AND  CORONARY ANGIOGRAPHY N/A 09/15/2016   Procedure: RIGHT/LEFT HEART CATH AND CORONARY ANGIOGRAPHY;  Surgeon: Larey Dresser, MD;  Location: [facility];  Service: Cardiovascular;  Laterality: N/A;   Social History   Occupational History  . Not on file  Tobacco Use  . Smoking status: Never Smoker  . Smokeless tobacco: Never Used  Substance and Sexual Activity  . Alcohol use: No  . Drug use: No  . Sexual activity: Not Currently

## 2017-02-04 ENCOUNTER — Other Ambulatory Visit: Payer: Self-pay | Admitting: Family Medicine

## 2017-02-04 DIAGNOSIS — F331 Major depressive disorder, recurrent, moderate: Secondary | ICD-10-CM

## 2017-02-13 ENCOUNTER — Other Ambulatory Visit: Payer: Self-pay | Admitting: Pulmonary Disease

## 2017-02-27 NOTE — Progress Notes (Signed)
HPI:   Kristen Gray is a 72 y.o. female, who is here today for 4 months follow up.   She was last seen on 10/21/2016.  Since her last OV she has seen orthopedist because right knee and hip pain. Today she brings a list of diagnosis she recently saw on my chart, she wonders if these are accurate and she argues that she was not aware about some of them.  Pulmonary hypertension, OSA, dyspnea, OA, sebaceous disease, hypertension, anxiety disorder, major depression, BMI 40 ("Fat")  She has been evaluated by cardiologist, Dr. Algernon Huxley, because chronic dyspnea and pulmonary hypertension (noted by echo). According to patient sleep study did not reveal significant sleep apnea, so CPAP was not recommended.   Anemia: She has no noted gross hematuria, abdominal pain, changes in bowel habits, or blood in stool. She has history of Crohn disease, she follows with GI, and overdue for colonoscopy. She states that her symptoms have been well controlled for years, s/p partial colectomy. Currently she is on Sulfasalazine. She is not on iron supplementation.  Exertional dyspnea has been stable, she is currently on Symbicort 80-4.5 mcg twice daily, which she feels like it helps some. She uses Albuterol inhaler as needed. She has had cardiac workup and otherwise negative.  On 10/12/2016 she had echo, LVEF 55-60%.  No regional wall motion abnormalities, mild AR,MR. PA peak pressure 37 mm Hg. Right/left heart cath and coronary angiography on 09/15/2016: Borderline elevated left heart filling pressure with normal RV filling pressure, borderline pulmonary hypertension, and normal coronary arteries.  Lexiscan Cardiolite stress test 04/2015: EF 50%, no ischemia or infarction.  Lab Results  Component Value Date   WBC 6.8 09/16/2016   HGB 10.5 (L) 09/16/2016   HCT 32.8 (L) 09/16/2016   MCV 96.8 09/16/2016   PLT 168 09/16/2016   Hypertension:  Currently she is on Tenoretic 50-25 mg daily. Last  eye exam:within the past year.  Home BP readings: She does not check it.  Lab Results  Component Value Date   CREATININE 1.25 (H) 10/21/2016   BUN 17 10/21/2016   NA 137 10/21/2016   K 4.6 10/21/2016   CL 99 10/21/2016   CO2 29 10/21/2016   Denies severe/frequent headache, visual changes, chest pain, worsening dyspnea, palpitation, or edema.  Insomnia: Currently she is on Trazodone 50 mg daily. Sleeping about 7 hours, sometimes he wakes up a couple times during the night but she is able to go back to sleep. Tolerating medication well, she denies side effects.  Anxiety and depression: Currently she is on Effexor XR 75 mg daily.  She denies suicidal thoughts. She is tries not to leave the house unless is absolutely necessary, she is very limited due to OA. She usually gets her groceries on line.   Lately she is frustrated because she has not been able to schedule colonoscopy (Hx of crohn's disease) and/or pursue right TKR  Generalized OA: Stable gait, she uses a cane.  Right knee surgery has been recommended but she is not willing to go to a SNF for rehab after surgery.  She lives alone. Her sister lives in another state and her best friend lives in the nursing home.  Right knee and hip pain had been worse lately,after fall. She states that in general pain is not severe.She usually rests when she has exacerbations. She has had 2 falls since her last OV, last fall 11/2016. She recently was evaluated by orthopedist,Dr Erlinda Hong.  According to  patient, she received intra-articular knee injection but did not help.   Hyperglycemia: Glucose 107,122,and 145.  She is trying to follow a healthy diet, not able to exercise regularly due to chronic pain.   Review of Systems  Constitutional: Positive for fatigue. Negative for activity change, appetite change and fever.  HENT: Negative for mouth sores, nosebleeds and trouble swallowing.   Eyes: Negative for redness and visual disturbance.    Respiratory: Positive for shortness of breath (no more than usual.). Negative for cough and wheezing.   Cardiovascular: Negative for chest pain, palpitations and leg swelling.  Gastrointestinal: Negative for abdominal pain, nausea and vomiting.       Negative for changes in bowel habits.  Endocrine: Negative for polydipsia, polyphagia and polyuria.  Genitourinary: Negative for decreased urine volume, difficulty urinating, dysuria and hematuria.  Musculoskeletal: Positive for back pain and gait problem.  Skin: Negative for rash and wound.  Neurological: Negative for syncope, weakness and headaches.  Psychiatric/Behavioral: Positive for sleep disturbance. Negative for confusion. The patient is nervous/anxious.      Current Outpatient Medications on File Prior to Visit  Medication Sig Dispense Refill  . acetaminophen (TYLENOL) 500 MG tablet Take 1,000 mg by mouth every 6 (six) hours as needed (FOR PAIN.).    Marland Kitchen albuterol (PROAIR HFA) 108 (90 Base) MCG/ACT inhaler Inhale 2 puffs into the lungs every 6 (six) hours as needed for wheezing or shortness of breath. 18 g 2  . atenolol-chlorthalidone (TENORETIC) 50-25 MG tablet TAKE 1 TABLET EVERY DAY 90 tablet 1  . budesonide-formoterol (SYMBICORT) 80-4.5 MCG/ACT inhaler Inhale 2 puffs into the lungs 2 (two) times daily. 1 Inhaler 2  . Calcium Carbonate-Vitamin D (CALTRATE 600+D PO) Take 2 tablets by mouth daily.    . cycloSPORINE (RESTASIS) 0.05 % ophthalmic emulsion Place 2 drops into both eyes 2 (two) times daily.    . folic acid (FOLVITE) 1 MG tablet Take 1 mg by mouth daily.    Marland Kitchen omeprazole (PRILOSEC) 20 MG capsule Take 20 mg by mouth daily before breakfast.    . potassium chloride SA (K-DUR,KLOR-CON) 20 MEQ tablet Take 2 tablets (40 mEq total) by mouth daily. 90 tablet 3  . sulfaSALAzine (AZULFIDINE) 500 MG tablet TAKE TWO TABLETS (1000 MG) BY MOUTH TWICE DAILY    . traZODone (DESYREL) 50 MG tablet TAKE 1 TABLET BY MOUTH NIGHTLY AT BEDTIME 30  tablet 5  . venlafaxine XR (EFFEXOR-XR) 75 MG 24 hr capsule TAKE 1 CAPSULE BY MOUTH EVERY DAY WITH BREAKFAST 90 capsule 0   No current facility-administered medications on file prior to visit.      Past Medical History:  Diagnosis Date  . Anemia   . Anxiety   . Arthritis   . Crohn's disease ([place])   . Depression   . GERD (gastroesophageal reflux disease)   . Heart murmur   . Hypertension   . Reflux    Allergies  Allergen Reactions  . Penicillin G Nausea Only    Has patient had a PCN reaction causing immediate rash, facial/tongue/throat swelling, SOB or lightheadedness with hypotension:No Has patient had a PCN reaction causing severe rash involving mucus membranes or skin necrosis: No Has patient had a PCN reaction that required hospitalization: No Has patient had a PCN reaction occurring within the last 10 years: No--NAUSEA ONLY If all of the above answers are "NO", then may proceed with Cephalosporin use.     Social History   Socioeconomic History  . Marital status: Widowed  Spouse name: None  . Number of children: None  . Years of education: None  . Highest education level: None  Social Needs  . Financial resource strain: None  . Food insecurity - worry: None  . Food insecurity - inability: None  . Transportation needs - medical: None  . Transportation needs - non-medical: None  Occupational History  . None  Tobacco Use  . Smoking status: Never Smoker  . Smokeless tobacco: Never Used  Substance and Sexual Activity  . Alcohol use: No  . Drug use: No  . Sexual activity: Not Currently  Other Topics Concern  . None  Social History Narrative  . None    Vitals:   02/28/17 1535  BP: 130/68  Pulse: 74  Resp: 16  Temp: 98.4 F (36.9 C)  SpO2: 96%   Body mass index is 34.96 kg/m.   Wt Readings from Last 3 Encounters:  02/28/17 167 lb 4 oz (75.9 kg)  10/21/16 176 lb 2 oz (79.9 kg)  10/04/16 176 lb 12.8 oz (80.2 kg)    Physical Exam  Nursing note  and vitals reviewed. Constitutional: She is oriented to person, place, and time. She appears well-developed. No distress.  HENT:  Head: Normocephalic and atraumatic.  Mouth/Throat: Oropharynx is clear and moist. Mucous membranes are dry.  Eyes: Conjunctivae are normal. Pupils are equal, round, and reactive to light.  Cardiovascular: Normal rate and regular rhythm.  No murmur heard. Pulses:      Dorsalis pedis pulses are 2+ on the right side, and 2+ on the left side.  Respiratory: Effort normal and breath sounds normal. No respiratory distress.  GI: Soft. She exhibits no mass. There is no hepatomegaly. There is no tenderness.  Musculoskeletal: She exhibits no edema.       Right knee: She exhibits decreased range of motion. Tenderness found.  Lymphadenopathy:    She has no cervical adenopathy.  Neurological: She is alert and oriented to person, place, and time. She has normal strength.  Mildly unstable gait assisted with a cane.  Skin: Skin is warm. No rash noted. No erythema.  Psychiatric: Her mood appears anxious. Her affect is labile.  Well groomed, good eye contact.     ASSESSMENT AND PLAN:   Kristen Gray was seen today for 4 months follow-up.  Orders Placed This Encounter  Procedures  . Basic metabolic panel  . CBC with Differential/Platelet  . Hemoglobin A1c  . Ambulatory referral to Physical Therapy    BMI 40.0-44.9, adult [facility]) She lost about 9 Lb since her last OV. Encouraged to continue a healthy diet and regular physical activity as tolerated.  It is hard for her to exercise due to generalized OA.   Generalized anxiety disorder Otherwise stable. No changes in Effexor XR 75 mg. Follow-up in 5 months, before if needed.   Hypertension, essential Adequately controlled. No changes in current management. DASH- Low salt diet to continue. Eye exam recommended annually. F/U in 5 months, before if needed.   Major depression She still has some depressed  mood. She is mainly frustrated about her limitations in regard to physical activity due to pain and by not having family or friends close by to help her with transportation/care if needed. I suggested to engage in social interaction like going to church among some but she is not interested in doing so.  In general symptoms have improved with Effexor, no changes for now. Some side effects discussed. Instructed about warning signs. Follow-up in 5  months, before if needed.  Insomnia This problem is well controlled, she is tolerating medication well. No changes in Trazodone 50 mg at bedtime. We discussed some side effects. Good sleep hygiene also important. Follow-up in 4 months.   Hyperglycemia:  We discussed the importance of primary prevention through a healthy lifestyle.   Frequent falls  We discussed fall precautions. PT will be arranged for his training and retraining, hopefully this will prevent future falls.   She will continue following with orthopedist for knee OA.  She definitely is not interested in SNF for PT after knee replacement.     -Ms. Pricilla Holm Overbeck was advised to return sooner than planned today if new concerns arise.       Tyronza Happe G. Martinique, MD  [facility]. [place] office.

## 2017-02-28 ENCOUNTER — Ambulatory Visit (INDEPENDENT_AMBULATORY_CARE_PROVIDER_SITE_OTHER): Payer: PPO | Admitting: Family Medicine

## 2017-02-28 ENCOUNTER — Encounter: Payer: Self-pay | Admitting: Family Medicine

## 2017-02-28 VITALS — BP 130/68 | HR 74 | Temp 98.4°F | Resp 16 | Ht <= 58 in | Wt 167.2 lb

## 2017-02-28 DIAGNOSIS — I10 Essential (primary) hypertension: Secondary | ICD-10-CM | POA: Diagnosis not present

## 2017-02-28 DIAGNOSIS — G4700 Insomnia, unspecified: Secondary | ICD-10-CM

## 2017-02-28 DIAGNOSIS — F331 Major depressive disorder, recurrent, moderate: Secondary | ICD-10-CM | POA: Diagnosis not present

## 2017-02-28 DIAGNOSIS — R296 Repeated falls: Secondary | ICD-10-CM

## 2017-02-28 DIAGNOSIS — F411 Generalized anxiety disorder: Secondary | ICD-10-CM | POA: Diagnosis not present

## 2017-02-28 DIAGNOSIS — R739 Hyperglycemia, unspecified: Secondary | ICD-10-CM | POA: Diagnosis not present

## 2017-02-28 DIAGNOSIS — Z6841 Body Mass Index (BMI) 40.0 and over, adult: Secondary | ICD-10-CM | POA: Diagnosis not present

## 2017-02-28 NOTE — Assessment & Plan Note (Signed)
She lost about 9 Lb since her last OV. Encouraged to continue a healthy diet and regular physical activity as tolerated.  It is hard for her to exercise due to generalized OA.

## 2017-02-28 NOTE — Assessment & Plan Note (Signed)
This problem is well controlled, she is tolerating medication well. No changes in Trazodone 50 mg at bedtime. We discussed some side effects. Good sleep hygiene also important. Follow-up in 4 months.

## 2017-02-28 NOTE — Patient Instructions (Addendum)
A few things to remember from today's visit:   Insomnia, unspecified type  Hyperglycemia - Plan: Hemoglobin A1c  Hypertension, essential - Plan: Basic metabolic panel, CBC with Differential/Platelet  Osteoarthritis is a chronic condition and gets worse with age.  The following may help:  Over the counter topical medications: Icy Hot or Asper cream with Lidocaine. Tai Chi or PT. Fall prevention. Avoid weight gain. Fish oil, over the counter Megared for example, 2 capsules daily. Tumeric.   Please be sure medication list is accurate. If a new problem present, please set up appointment sooner than planned today.

## 2017-02-28 NOTE — Assessment & Plan Note (Signed)
She still has some depressed mood. She is mainly frustrated about her limitations in regard to physical activity due to pain and by not having family or friends close by to help her with transportation/care if needed. I suggested to engage in social interaction like going to church among some but she is not interested in doing so.  In general symptoms have improved with Effexor, no changes for now. Some side effects discussed. Instructed about warning signs. Follow-up in 5 months, before if needed.

## 2017-02-28 NOTE — Assessment & Plan Note (Addendum)
Otherwise stable. No changes in Effexor XR 75 mg. Follow-up in 5 months, before if needed.

## 2017-02-28 NOTE — Assessment & Plan Note (Signed)
Adequately controlled. No changes in current management. DASH- Low salt diet to continue. Eye exam recommended annually. F/U in 5 months, before if needed.

## 2017-03-01 LAB — CBC WITH DIFFERENTIAL/PLATELET
Basophils Absolute: 0.1 10*3/uL (ref 0.0–0.1)
Basophils Relative: 1.1 % (ref 0.0–3.0)
Eosinophils Absolute: 0.1 10*3/uL (ref 0.0–0.7)
Eosinophils Relative: 1.7 % (ref 0.0–5.0)
HCT: 38.7 % (ref 36.0–46.0)
Hemoglobin: 12.3 g/dL (ref 12.0–15.0)
Lymphocytes Relative: 15.2 % (ref 12.0–46.0)
Lymphs Abs: 1.3 10*3/uL (ref 0.7–4.0)
MCHC: 31.8 g/dL (ref 30.0–36.0)
MCV: 104.2 fl — ABNORMAL HIGH (ref 78.0–100.0)
Monocytes Absolute: 0.6 10*3/uL (ref 0.1–1.0)
Monocytes Relative: 7.3 % (ref 3.0–12.0)
Neutro Abs: 6.4 10*3/uL (ref 1.4–7.7)
Neutrophils Relative %: 74.7 % (ref 43.0–77.0)
Platelets: 233 10*3/uL (ref 150.0–400.0)
RBC: 3.71 Mil/uL — ABNORMAL LOW (ref 3.87–5.11)
RDW: 14.2 % (ref 11.5–15.5)
WBC: 8.5 10*3/uL (ref 4.0–10.5)

## 2017-03-01 LAB — BASIC METABOLIC PANEL
BUN: 23 mg/dL (ref 6–23)
CO2: 32 mEq/L (ref 19–32)
Calcium: 9.8 mg/dL (ref 8.4–10.5)
Chloride: 98 mEq/L (ref 96–112)
Creatinine, Ser: 1.15 mg/dL (ref 0.40–1.20)
GFR: 49.09 mL/min — ABNORMAL LOW (ref >60.00)
Glucose, Bld: 141 mg/dL — ABNORMAL HIGH (ref 70–99)
Potassium: 3.8 mEq/L (ref 3.5–5.1)
Sodium: 138 mEq/L (ref 135–145)

## 2017-03-01 LAB — HEMOGLOBIN A1C: Hgb A1c MFr Bld: 4.8 % (ref 4.6–6.5)

## 2017-03-05 ENCOUNTER — Encounter: Payer: Self-pay | Admitting: Family Medicine

## 2017-03-06 ENCOUNTER — Telehealth: Payer: Self-pay | Admitting: Family Medicine

## 2017-03-06 NOTE — Telephone Encounter (Signed)
Copied from [place]. Topic: Quick Communication - Rx Refill/Question >> Mar 06, 2017  1:58 PM Synthia Innocent wrote: Medication:  traZODone (DESYREL) 50 MG tablet   Has the patient contacted their pharmacy? Yes.     (Agent: If no, request that the patient contact the pharmacy for the refill.)   Preferred Pharmacy (with phone number or street name): [facility]   Agent: Please be advised that RX refills may take up to 3 business days. We ask that you follow-up with your pharmacy.   Patient is out and requesting ASAP

## 2017-03-06 NOTE — Telephone Encounter (Signed)
Trazodone refill Last OV: 02/28/17 with Dr. Martinique Last Refill:02/13/17 by a different provider Pharmacy:[facility]

## 2017-03-07 ENCOUNTER — Other Ambulatory Visit: Payer: Self-pay | Admitting: Family Medicine

## 2017-03-07 MED ORDER — TRAZODONE HCL 50 MG PO TABS: ORAL_TABLET | ORAL | 1 refills | Status: DC

## 2017-03-07 NOTE — Telephone Encounter (Signed)
Rx for Trazodone sent. Thanks, BJ

## 2017-05-01 DIAGNOSIS — K219 Gastro-esophageal reflux disease without esophagitis: Secondary | ICD-10-CM | POA: Diagnosis not present

## 2017-05-01 DIAGNOSIS — K5090 Crohn's disease, unspecified, without complications: Secondary | ICD-10-CM | POA: Diagnosis not present

## 2017-05-01 DIAGNOSIS — R634 Abnormal weight loss: Secondary | ICD-10-CM | POA: Diagnosis not present

## 2017-06-15 DIAGNOSIS — K5090 Crohn's disease, unspecified, without complications: Secondary | ICD-10-CM | POA: Diagnosis not present

## 2017-06-22 ENCOUNTER — Other Ambulatory Visit: Payer: Self-pay | Admitting: Family Medicine

## 2017-07-11 ENCOUNTER — Other Ambulatory Visit: Payer: Self-pay | Admitting: Family Medicine

## 2017-07-11 ENCOUNTER — Other Ambulatory Visit: Payer: Self-pay | Admitting: Pulmonary Disease

## 2017-07-11 DIAGNOSIS — F331 Major depressive disorder, recurrent, moderate: Secondary | ICD-10-CM

## 2017-07-11 DIAGNOSIS — F411 Generalized anxiety disorder: Secondary | ICD-10-CM

## 2017-07-13 DIAGNOSIS — H1811 Bullous keratopathy, right eye: Secondary | ICD-10-CM | POA: Diagnosis not present

## 2017-07-13 DIAGNOSIS — H1711 Central corneal opacity, right eye: Secondary | ICD-10-CM | POA: Diagnosis not present

## 2017-07-17 DIAGNOSIS — H1813 Bullous keratopathy, bilateral: Secondary | ICD-10-CM | POA: Diagnosis not present

## 2017-07-25 DIAGNOSIS — H1813 Bullous keratopathy, bilateral: Secondary | ICD-10-CM | POA: Diagnosis not present

## 2017-07-28 ENCOUNTER — Ambulatory Visit (INDEPENDENT_AMBULATORY_CARE_PROVIDER_SITE_OTHER): Payer: PPO | Admitting: Family Medicine

## 2017-07-28 ENCOUNTER — Encounter: Payer: Self-pay | Admitting: Family Medicine

## 2017-07-28 VITALS — BP 120/82 | HR 75 | Temp 98.5°F | Resp 12 | Ht <= 58 in | Wt 158.5 lb

## 2017-07-28 DIAGNOSIS — Z6841 Body Mass Index (BMI) 40.0 and over, adult: Secondary | ICD-10-CM

## 2017-07-28 DIAGNOSIS — R296 Repeated falls: Secondary | ICD-10-CM

## 2017-07-28 DIAGNOSIS — F411 Generalized anxiety disorder: Secondary | ICD-10-CM | POA: Diagnosis not present

## 2017-07-28 DIAGNOSIS — R0609 Other forms of dyspnea: Secondary | ICD-10-CM | POA: Diagnosis not present

## 2017-07-28 DIAGNOSIS — R0600 Dyspnea, unspecified: Secondary | ICD-10-CM

## 2017-07-28 DIAGNOSIS — K219 Gastro-esophageal reflux disease without esophagitis: Secondary | ICD-10-CM | POA: Diagnosis not present

## 2017-07-28 DIAGNOSIS — I10 Essential (primary) hypertension: Secondary | ICD-10-CM | POA: Diagnosis not present

## 2017-07-28 DIAGNOSIS — F331 Major depressive disorder, recurrent, moderate: Secondary | ICD-10-CM

## 2017-07-28 DIAGNOSIS — G4700 Insomnia, unspecified: Secondary | ICD-10-CM

## 2017-07-28 LAB — COMPREHENSIVE METABOLIC PANEL
AG Ratio: 2 (calc) (ref 1.0–2.5)
ALT: 5 U/L — ABNORMAL LOW (ref 6–29)
AST: 15 U/L (ref 10–35)
Albumin: 4.3 g/dL (ref 3.6–5.1)
Alkaline phosphatase (APISO): 73 U/L (ref 33–130)
BUN/Creatinine Ratio: 18 (calc) (ref 6–22)
BUN: 20 mg/dL (ref 7–25)
CO2: 30 mmol/L (ref 20–32)
Calcium: 9.8 mg/dL (ref 8.6–10.4)
Chloride: 98 mmol/L (ref 98–110)
Creat: 1.11 mg/dL — ABNORMAL HIGH (ref 0.60–0.93)
Globulin: 2.2 g/dL (calc) (ref 1.9–3.7)
Glucose, Bld: 146 mg/dL — ABNORMAL HIGH (ref 65–99)
Potassium: 4.7 mmol/L (ref 3.5–5.3)
Sodium: 136 mmol/L (ref 135–146)
Total Bilirubin: 0.5 mg/dL (ref 0.2–1.2)
Total Protein: 6.5 g/dL (ref 6.1–8.1)

## 2017-07-28 MED ORDER — TRAZODONE HCL 50 MG PO TABS: ORAL_TABLET | ORAL | 2 refills | Status: DC

## 2017-07-28 NOTE — Assessment & Plan Note (Signed)
She lost about 12 pounds since her last visit in 02/2016. Encouraged to continue a healthy diet. She is not able to exercise regularly due to unstable gait/OA.

## 2017-07-28 NOTE — Progress Notes (Signed)
HPI:   [name] is a 72 y.o. female, who is here today for 6 months follow up.   She was last seen on 02/28/17 She has followed with her GI,Dr Oletta Lamas.  She also followed with her ophthalmologist. She was started on new medication, still having blurry vision despite trying different .According to pt,she was referred to another provider to get a second opinion.  Hypertension:  Currently on atenolol-chlorthalidone 50-25 mg daily.  She is taking medications as instructed, no side effects reported.  She has not noted unusual headache, visual changes, exertional chest pain, focal weakness, or edema.   Lab Results  Component Value Date   CREATININE 1.15 02/28/2017   BUN 23 02/28/2017   NA 138 02/28/2017   K 3.8 02/28/2017   CL 98 02/28/2017   CO2 32 02/28/2017   Hypokalemia: Currently she is on K-Dur 20 mEq daily.  Insomnia: Currently she is on trazodone 50 mg at bedtime.  Sleeping about 6 hours, she still wakes up at least once and it takes her almost an hours to go back to sleep. She is tolerating medication well and denies side effects.  Depression and anxiety: Currently she is on Effexor XR 75 mg daily. Negative for suicidal thoughts.  Medication still helping. She is now going out buying groceries instead ordering on line.  Fatigue has improved.  Dyspnea:  Chronic problem. She reported some improvement after starting Symbicort but now she has not used it for a few months and not sure if it was really helping.  She has Albuterol inh to used prn,has not used it in a while. No cough or wheezing. Hx of pulmonary HT.   She has been evaluated by pulmonologist and cardiologist. Echo 09/2016 LVEF 55-60% Right/left cardiac and coronary angiography in 08/2016: 1. Borderline elevated left heart filling pressure with normal RV filling pressure.  2. Borderline pulmonary hypertension.  3. Normal coronaries.   09/2015 PFT's did not showed  obstruction.  Noted wt loss. She started watching what she was eating, her appetite has decreased,so eating smaller portions. She is cooking more at home. Not exercising due to knee OA.  She has had a few falls since her last OV, not seriously harmed. She states that she has fallen because she has not being very careful. She has been able to get up without help.  She has safety features in bathroom to prevent falls.   Review of Systems  Constitutional: Positive for fatigue (Improved). Negative for activity change, appetite change and fever.  HENT: Negative for mouth sores, nosebleeds and trouble swallowing.   Eyes: Negative for redness and visual disturbance.  Respiratory: Negative for cough, shortness of breath and wheezing.   Cardiovascular: Negative for chest pain, palpitations and leg swelling.  Gastrointestinal: Negative for abdominal pain, nausea and vomiting.       Negative for changes in bowel habits.  Genitourinary: Negative for decreased urine volume and hematuria.  Musculoskeletal: Positive for arthralgias and gait problem.  Skin: Negative for rash and wound.  Neurological: Negative for syncope, weakness and headaches.  Psychiatric/Behavioral: Positive for sleep disturbance. The patient is nervous/anxious.      Current Outpatient Medications on File Prior to Visit  Medication Sig Dispense Refill  . acetaminophen (TYLENOL) 500 MG tablet Take 1,000 mg by mouth every 6 (six) hours as needed (FOR PAIN.).    Marland Kitchen albuterol (PROAIR HFA) 108 (90 Base) MCG/ACT inhaler Inhale 2 puffs into the lungs every 6 (six) hours as  needed for wheezing or shortness of breath. 18 g 2  . atenolol-chlorthalidone (TENORETIC) 50-25 MG tablet TAKE 1 TABLET EVERY DAY 90 tablet 1  . budesonide-formoterol (SYMBICORT) 80-4.5 MCG/ACT inhaler Inhale 2 puffs into the lungs 2 (two) times daily. 1 Inhaler 2  . Calcium Carbonate-Vitamin D (CALTRATE 600+D PO) Take 2 tablets by mouth daily.    . cycloSPORINE  (RESTASIS) 0.05 % ophthalmic emulsion Place 2 drops into both eyes 2 (two) times daily.    . folic acid (FOLVITE) 1 MG tablet Take 1 mg by mouth daily.    Marland Kitchen neomycin-polymyxin b-dexamethasone (MAXITROL) 3.5-10000-0.1 SUSP instill 1 drop into BOTH eyes 4 TIMES DAILY AS DIRECTED  0  . omeprazole (PRILOSEC) 20 MG capsule Take 20 mg by mouth daily before breakfast.    . potassium chloride SA (K-DUR,KLOR-CON) 20 MEQ tablet Take 2 tablets (40 mEq total) by mouth daily. 90 tablet 3  . prednisoLONE acetate (PRED FORTE) 1 % ophthalmic suspension Instill 1 drop into both eyes every two hours while awake  0  . sulfaSALAzine (AZULFIDINE) 500 MG tablet TAKE TWO TABLETS (1000 MG) BY MOUTH TWICE DAILY    . valACYclovir (VALTREX) 1000 MG tablet Take 1,000 mg by mouth 3 (three) times daily.  0  . venlafaxine XR (EFFEXOR-XR) 75 MG 24 hr capsule TAKE 1 CAPSULE BY MOUTH EVERY DAY WITH BREAKFAST 90 capsule 1   No current facility-administered medications on file prior to visit.      Past Medical History:  Diagnosis Date  . Anemia   . Anxiety   . Arthritis   . Crohn's disease ([place])   . Depression   . GERD (gastroesophageal reflux disease)   . Heart murmur   . Hypertension   . Reflux    Allergies  Allergen Reactions  . Penicillin G Nausea Only    Has patient had a PCN reaction causing immediate rash, facial/tongue/throat swelling, SOB or lightheadedness with hypotension:No Has patient had a PCN reaction causing severe rash involving mucus membranes or skin necrosis: No Has patient had a PCN reaction that required hospitalization: No Has patient had a PCN reaction occurring within the last 10 years: No--NAUSEA ONLY If all of the above answers are "NO", then may proceed with Cephalosporin use.     Social History   Socioeconomic History  . Marital status: Widowed    Spouse name: Not on file  . Number of children: Not on file  . Years of education: Not on file  . Highest education level: Not on file   Occupational History  . Not on file  Social Needs  . Financial resource strain: Not on file  . Food insecurity:    Worry: Not on file    Inability: Not on file  . Transportation needs:    Medical: Not on file    Non-medical: Not on file  Tobacco Use  . Smoking status: Never Smoker  . Smokeless tobacco: Never Used  Substance and Sexual Activity  . Alcohol use: No  . Drug use: No  . Sexual activity: Not Currently  Lifestyle  . Physical activity:    Days per week: Not on file    Minutes per session: Not on file  . Stress: Not on file  Relationships  . Social connections:    Talks on phone: Not on file    Gets together: Not on file    Attends religious service: Not on file    Active member of club or organization: Not on file  Attends meetings of clubs or organizations: Not on file    Relationship status: Not on file  Other Topics Concern  . Not on file  Social History Narrative  . Not on file    Vitals:   07/28/17 1511  BP: 120/82  Pulse: 75  Resp: 12  Temp: 98.5 F (36.9 C)  SpO2: 96%   Body mass index is 33.13 kg/m.  Wt Readings from Last 3 Encounters:  07/28/17 158 lb 8 oz (71.9 kg)  02/28/17 167 lb 4 oz (75.9 kg)  10/21/16 176 lb 2 oz (79.9 kg)    Physical Exam  Nursing note and vitals reviewed. Constitutional: She is oriented to person, place, and time. She appears well-developed. No distress.  HENT:  Head: Normocephalic and atraumatic.  Mouth/Throat: Oropharynx is clear and moist and mucous membranes are normal.  Eyes: Pupils are equal, round, and reactive to light. Conjunctivae are normal.  Cardiovascular: Normal rate and regular rhythm.  No murmur heard. Pulses:      Dorsalis pedis pulses are 2+ on the right side, and 2+ on the left side.  Respiratory: Effort normal and breath sounds normal. No respiratory distress.  GI: Soft. She exhibits no mass. There is no hepatomegaly. There is no tenderness.  Musculoskeletal: She exhibits no edema.   Lymphadenopathy:    She has no cervical adenopathy.  Neurological: She is alert and oriented to person, place, and time. She has normal strength.  Mildly unstable gait assisted with a cane.  Skin: Skin is warm. No erythema.  Psychiatric: She has a normal mood and affect.  Well groomed, good eye contact.      ASSESSMENT AND PLAN:   Ms. Demica Zook was seen today for 6 months follow-up.  Orders Placed This Encounter  Procedures  . CMP   Lab Results  Component Value Date   ALT 5 (L) 07/28/2017   AST 15 07/28/2017   BILITOT 0.5 07/28/2017   Lab Results  Component Value Date   CREATININE 1.11 (H) 07/28/2017   BUN 20 07/28/2017   NA 136 07/28/2017   K 4.7 07/28/2017   CL 98 07/28/2017   CO2 30 07/28/2017     Hypertension, essential Adequately controlled. No changes in current management. Continue low-salt diet. Eye exam is current. F/U in 6 months, before if needed.   Insomnia She is still symptomatic but better with medication. Good sleep hygiene. No changes in trazodone 50 mg at bedtime. Follow-up in 6 months.  GERD (gastroesophageal reflux disease) She follows with GI annually. We discussed some side effects of PPIs in general. Because symptoms reoccur when she stops omeprazole 20 mg, no changes in current management.  Major depression In general it seems to be well controlled with Effexor X are 75 mg daily. No changes in current management. Follow-up in 6 months.  Generalized anxiety disorder Stable overall. Continue Effexor XR 75 mg daily and trazodone 50 mg. Follow-up in 6 months.   Morbid obesity with BMI of 40.0-44.9, adult ([place]) She lost about 12 pounds since her last visit in 02/2016. Encouraged to continue a healthy diet. She is not able to exercise regularly due to unstable gait/OA.   Dyspnea Chronic. PFT's negative for obstruction. Deconditioning and pulmonary HT playing a role.  For now she will continue with Albuterol inhaler 2  puffs every 6 hours as needed.  Follow-up in 12 months, before if needed.  Falls frequently  She is not interested in PT/OT evaluation for fall prevention. We discussed some  strategies to prevent future falls.      Madisson Kulaga G. [place], MD  [facility]. [place] office.

## 2017-07-28 NOTE — Assessment & Plan Note (Signed)
She follows with GI annually. We discussed some side effects of PPIs in general. Because symptoms reoccur when she stops omeprazole 20 mg, no changes in current management.

## 2017-07-28 NOTE — Assessment & Plan Note (Signed)
In general it seems to be well controlled with Effexor X are 75 mg daily. No changes in current management. Follow-up in 6 months.

## 2017-07-28 NOTE — Assessment & Plan Note (Signed)
Stable overall. Continue Effexor XR 75 mg daily and trazodone 50 mg. Follow-up in 6 months.

## 2017-07-28 NOTE — Assessment & Plan Note (Signed)
She is still symptomatic but better with medication. Good sleep hygiene. No changes in trazodone 50 mg at bedtime. Follow-up in 6 months.

## 2017-07-28 NOTE — Assessment & Plan Note (Signed)
Adequately controlled. No changes in current management. Continue low-salt diet. Eye exam is current. F/U in 6 months, before if needed.

## 2017-07-28 NOTE — Assessment & Plan Note (Addendum)
Chronic. PFT's negative for obstruction. Deconditioning and pulmonary HT playing a role.  For now she will continue with Albuterol inhaler 2 puffs every 6 hours as needed.  Follow-up in 12 months, before if needed.

## 2017-07-28 NOTE — Patient Instructions (Addendum)
A few things to remember from today's visit:   Insomnia, unspecified type  Hypertension, essential - Plan: Comprehensive metabolic panel  Generalized anxiety disorder  Moderate episode of recurrent major depressive disorder ([place])  Falls frequently   Fall Prevention in the Home Falls can cause injuries. They can happen to people of all ages. There are many things you can do to make your home safe and to help prevent falls. What can I do on the outside of my home?  Regularly fix the edges of walkways and driveways and fix any cracks.  Remove anything that might make you trip as you walk through a door, such as a raised step or threshold.  Trim any bushes or trees on the path to your home.  Use bright outdoor lighting.  Clear any walking paths of anything that might make someone trip, such as rocks or tools.  Regularly check to see if handrails are loose or broken. Make sure that both sides of any steps have handrails.  Any raised decks and porches should have guardrails on the edges.  Have any leaves, snow, or ice cleared regularly.  Use sand or salt on walking paths during winter.  Clean up any spills in your garage right away. This includes oil or grease spills. What can I do in the bathroom?  Use night lights.  Install grab bars by the toilet and in the tub and shower. Do not use towel bars as grab bars.  Use non-skid mats or decals in the tub or shower.  If you need to sit down in the shower, use a plastic, non-slip stool.  Keep the floor dry. Clean up any water that spills on the floor as soon as it happens.  Remove soap buildup in the tub or shower regularly.  Attach bath mats securely with double-sided non-slip rug tape.  Do not have throw rugs and other things on the floor that can make you trip. What can I do in the bedroom?  Use night lights.  Make sure that you have a light by your bed that is easy to reach.  Do not use any sheets or blankets that  are too big for your bed. They should not hang down onto the floor.  Have a firm chair that has side arms. You can use this for support while you get dressed.  Do not have throw rugs and other things on the floor that can make you trip. What can I do in the kitchen?  Clean up any spills right away.  Avoid walking on wet floors.  Keep items that you use a lot in easy-to-reach places.  If you need to reach something above you, use a strong step stool that has a grab bar.  Keep electrical cords out of the way.  Do not use floor polish or wax that makes floors slippery. If you must use wax, use non-skid floor wax.  Do not have throw rugs and other things on the floor that can make you trip. What can I do with my stairs?  Do not leave any items on the stairs.  Make sure that there are handrails on both sides of the stairs and use them. Fix handrails that are broken or loose. Make sure that handrails are as long as the stairways.  Check any carpeting to make sure that it is firmly attached to the stairs. Fix any carpet that is loose or worn.  Avoid having throw rugs at the top or bottom of the  stairs. If you do have throw rugs, attach them to the floor with carpet tape.  Make sure that you have a light switch at the top of the stairs and the bottom of the stairs. If you do not have them, ask someone to add them for you. What else can I do to help prevent falls?  Wear shoes that: ? Do not have high heels. ? Have rubber bottoms. ? Are comfortable and fit you well. ? Are closed at the toe. Do not wear sandals.  If you use a stepladder: ? Make sure that it is fully opened. Do not climb a closed stepladder. ? Make sure that both sides of the stepladder are locked into place. ? Ask someone to hold it for you, if possible.  Clearly mark and make sure that you can see: ? Any grab bars or handrails. ? First and last steps. ? Where the edge of each step is.  Use tools that help you  move around (mobility aids) if they are needed. These include: ? Canes. ? Walkers. ? Scooters. ? Crutches.  Turn on the lights when you go into a dark area. Replace any light bulbs as soon as they burn out.  Set up your furniture so you have a clear path. Avoid moving your furniture around.  If any of your floors are uneven, fix them.  If there are any pets around you, be aware of where they are.  Review your medicines with your doctor. Some medicines can make you feel dizzy. This can increase your chance of falling. Ask your doctor what other things that you can do to help prevent falls. This information is not intended to replace advice given to you by your health care provider. Make sure you discuss any questions you have with your health care provider. Document Released: 11/06/2008 Document Revised: 06/18/2015 Document Reviewed: 02/14/2014 Elsevier Interactive Patient Education  [employer].  Please be sure medication list is accurate. If a new problem present, please set up appointment sooner than planned today.

## 2017-08-02 DIAGNOSIS — H1813 Bullous keratopathy, bilateral: Secondary | ICD-10-CM | POA: Diagnosis not present

## 2017-08-31 DIAGNOSIS — H1813 Bullous keratopathy, bilateral: Secondary | ICD-10-CM | POA: Diagnosis not present

## 2017-10-04 DIAGNOSIS — H1820 Unspecified corneal edema: Secondary | ICD-10-CM | POA: Diagnosis not present

## 2017-10-04 DIAGNOSIS — H1859 Other hereditary corneal dystrophies: Secondary | ICD-10-CM | POA: Diagnosis not present

## 2017-10-11 ENCOUNTER — Other Ambulatory Visit (HOSPITAL_COMMUNITY): Payer: Self-pay | Admitting: Adult Health

## 2017-10-11 ENCOUNTER — Other Ambulatory Visit: Payer: Self-pay | Admitting: Family Medicine

## 2017-10-11 DIAGNOSIS — F411 Generalized anxiety disorder: Secondary | ICD-10-CM

## 2017-10-11 DIAGNOSIS — F331 Major depressive disorder, recurrent, moderate: Secondary | ICD-10-CM

## 2017-11-13 DIAGNOSIS — H1859 Other hereditary corneal dystrophies: Secondary | ICD-10-CM | POA: Diagnosis not present

## 2017-11-13 DIAGNOSIS — H1820 Unspecified corneal edema: Secondary | ICD-10-CM | POA: Diagnosis not present

## 2017-12-26 ENCOUNTER — Other Ambulatory Visit: Payer: Self-pay | Admitting: Family Medicine

## 2018-01-13 ENCOUNTER — Other Ambulatory Visit (HOSPITAL_COMMUNITY): Payer: Self-pay | Admitting: Adult Health

## 2018-01-18 ENCOUNTER — Other Ambulatory Visit (HOSPITAL_COMMUNITY): Payer: Self-pay

## 2018-01-18 MED ORDER — POTASSIUM CHLORIDE CRYS ER 20 MEQ PO TBCR: 40.0000 meq | EXTENDED_RELEASE_TABLET | Freq: Every day | ORAL | 0 refills | Status: DC

## 2018-01-30 ENCOUNTER — Ambulatory Visit: Payer: PPO | Admitting: Family Medicine

## 2018-02-06 ENCOUNTER — Other Ambulatory Visit: Payer: Self-pay | Admitting: Family Medicine

## 2018-02-13 ENCOUNTER — Ambulatory Visit: Payer: PPO | Admitting: Family Medicine

## 2018-02-20 ENCOUNTER — Ambulatory Visit (INDEPENDENT_AMBULATORY_CARE_PROVIDER_SITE_OTHER): Payer: PPO | Admitting: Family Medicine

## 2018-02-20 ENCOUNTER — Encounter: Payer: Self-pay | Admitting: Family Medicine

## 2018-02-20 VITALS — BP 122/60 | HR 70 | Temp 98.7°F | Resp 16 | Ht <= 58 in | Wt 162.0 lb

## 2018-02-20 DIAGNOSIS — E538 Deficiency of other specified B group vitamins: Secondary | ICD-10-CM

## 2018-02-20 DIAGNOSIS — R5383 Other fatigue: Secondary | ICD-10-CM

## 2018-02-20 DIAGNOSIS — F331 Major depressive disorder, recurrent, moderate: Secondary | ICD-10-CM | POA: Diagnosis not present

## 2018-02-20 DIAGNOSIS — E876 Hypokalemia: Secondary | ICD-10-CM

## 2018-02-20 DIAGNOSIS — Z23 Encounter for immunization: Secondary | ICD-10-CM

## 2018-02-20 DIAGNOSIS — G4700 Insomnia, unspecified: Secondary | ICD-10-CM | POA: Diagnosis not present

## 2018-02-20 DIAGNOSIS — I10 Essential (primary) hypertension: Secondary | ICD-10-CM

## 2018-02-20 DIAGNOSIS — F411 Generalized anxiety disorder: Secondary | ICD-10-CM | POA: Diagnosis not present

## 2018-02-20 MED ORDER — TRAZODONE HCL 100 MG PO TABS: 100.0000 mg | ORAL_TABLET | Freq: Every day | ORAL | 1 refills | Status: DC

## 2018-02-20 MED ORDER — CYANOCOBALAMIN 1000 MCG/ML IJ SOLN
1000.0000 ug | Freq: Once | INTRAMUSCULAR | Status: AC
  Administered 2018-02-20: 1000 ug via INTRAMUSCULAR
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
## 2018-02-20 NOTE — Assessment & Plan Note (Signed)
Getting worse. Trazodone increased from 50 mg to 100 mg at bedtime. We discussed some side effects as well as the risk of interaction with Effexor. Good sleep hygiene. Follow-up in 6 months, before if needed.

## 2018-02-20 NOTE — Assessment & Plan Note (Signed)
Stable otherwise. No changes in Effexor XR 75 mg daily. Follow-up in 6 months, before if needed.

## 2018-02-20 NOTE — Assessment & Plan Note (Signed)
No changes in current management, will follow labs done today and will give further recommendations accordingly.  

## 2018-02-20 NOTE — Assessment & Plan Note (Signed)
Adequately controlled. No changes in current management. Continue low-salt diet. Eye exam is current. Follow-up in 6 months.

## 2018-02-20 NOTE — Patient Instructions (Addendum)
A few things to remember from today's visit:   Hypertension, essential - Plan: Basic metabolic panel  Generalized anxiety disorder  Insomnia, unspecified type  Moderate episode of recurrent major depressive disorder ([facility])  Trazodone increased from 50 mg to 100 mg at bedtime.  If you have a 50 mg tablet you can take 2 at the same time until your new prescription.  No changes in the rest of your medications.  Please be sure medication list is accurate. If a new problem present, please set up appointment sooner than planned today.

## 2018-02-21 LAB — BASIC METABOLIC PANEL
BUN: 21 mg/dL (ref 6–23)
CO2: 30 mEq/L (ref 19–32)
Calcium: 9.8 mg/dL (ref 8.4–10.5)
Chloride: 99 mEq/L (ref 96–112)
Creatinine, Ser: 1.2 mg/dL (ref 0.40–1.20)
GFR: 43.85 mL/min — ABNORMAL LOW (ref >60.00)
Glucose, Bld: 119 mg/dL — ABNORMAL HIGH (ref 70–99)
Potassium: 4.3 mEq/L (ref 3.5–5.1)
Sodium: 137 mEq/L (ref 135–145)

## 2018-02-23 ENCOUNTER — Encounter: Payer: Self-pay | Admitting: Family Medicine

## 2018-03-27 ENCOUNTER — Encounter (INDEPENDENT_AMBULATORY_CARE_PROVIDER_SITE_OTHER): Payer: Self-pay | Admitting: Orthopaedic Surgery

## 2018-03-27 ENCOUNTER — Ambulatory Visit (INDEPENDENT_AMBULATORY_CARE_PROVIDER_SITE_OTHER): Payer: PPO

## 2018-03-27 ENCOUNTER — Ambulatory Visit (INDEPENDENT_AMBULATORY_CARE_PROVIDER_SITE_OTHER): Payer: PPO | Admitting: Orthopaedic Surgery

## 2018-03-27 DIAGNOSIS — M1712 Unilateral primary osteoarthritis, left knee: Secondary | ICD-10-CM

## 2018-03-27 MED ORDER — BUPIVACAINE HCL 0.25 % IJ SOLN
2.0000 mL | INTRAMUSCULAR | Status: AC | PRN
  Administered 2018-03-27: 2 mL via INTRA_ARTICULAR

## 2018-03-27 MED ORDER — LIDOCAINE HCL 1 % IJ SOLN
2.0000 mL | INTRAMUSCULAR | Status: AC | PRN
  Administered 2018-03-27: 2 mL

## 2018-03-27 MED ORDER — METHYLPREDNISOLONE ACETATE 40 MG/ML IJ SUSP
40.0000 mg | INTRAMUSCULAR | Status: AC | PRN
  Administered 2018-03-27: 40 mg via INTRA_ARTICULAR

## 2018-03-27 NOTE — Progress Notes (Signed)
Office Visit Note   Patient: Kristen Gray           Date of Birth: 1945/09/09           MRN: [id number] Visit Date: 03/27/2018              Requested by: Martinique, Betty G, MD [address] [place], [place] [address] PCP: Martinique, Betty G, MD   Assessment & Plan: Visit Diagnoses:  1. Unilateral primary osteoarthritis, left knee     Plan: Impression is primary localized osteoarthritis left knee.  We will proceed with a left knee cortisone injection today.  I have discussed viscosupplementation injection should cortisone fail to improve her symptoms.  She will call and let us know if she would like to submit approval for this.  Otherwise, follow-up with [place] as needed.  Follow-Up Instructions: Return if symptoms worsen or fail to improve.   Orders:  Orders Placed This Encounter  Procedures  . Large Joint Inj: L knee  . XR KNEE 3 VIEW LEFT   No orders of the defined types were placed in this encounter.     Procedures: Large Joint Inj: L knee on 03/27/2018 3:23 PM Indications: pain Details: 22 G needle, anterolateral approach Medications: 2 mL lidocaine 1 %; 2 mL bupivacaine 0.25 %; 40 mg methylPREDNISolone acetate 40 MG/ML      Clinical Data: No additional findings.   Subjective: Chief Complaint  Patient presents with  . Left Knee - Pain    HPI patient is a pleasant 73 year old female who presents to our clinic today with left knee pain.  This began at the end of January when she was standing in line for long period of time at the [facility].  She has had moderate pain to the entire aspect of the left knee since.  She describes this as a toothache.  Pain appears to be worse when walking or when standing for too long time.  She gets relief when resting.  She has no night pain.  No previous cortisone injection to the left knee.  She does have a history of right knee osteoarthritis with a cortisone injection back in January 2019.  Doing well with the right knee.  Review of  Systems as detailed in HPI.  All others reviewed and are negative.   Objective: Vital Signs: There were no vitals taken for this visit.  Physical Exam well-developed well-nourished female in no acute distress.  Alert and oriented x3.  Ortho Exam examination of her left knee shows a trace effusion.  Range of motion 0 to 115 degrees.  Medial and lateral joint line tenderness.  Marked patellofemoral crepitus.  She does have fullness in the popliteal fossa.  Stable valgus varus stress.  She is neurovascular intact distally.  Specialty Comments:  No specialty comments available.  Imaging: Xr Knee 3 View Left  Result Date: 03/27/2018 Moderate tricompartmental degenerative changes worse in the patellofemoral medial compartment    PMFS History: Patient Active Problem List   Diagnosis Date Noted  . Unilateral primary osteoarthritis, left knee 03/27/2018  . Hypokalemia 02/20/2018  . GERD (gastroesophageal reflux disease) 07/28/2017  . Unilateral primary osteoarthritis, right knee 01/31/2017  . Sebaceous cyst 10/21/2016  . Anemia 10/20/2016  . Primary osteoarthritis of right hip 09/12/2016  . Pulmonary hypertension ([place]) 07/26/2016  . OSA (obstructive sleep apnea) 07/26/2016  . Morbid obesity with BMI of 40.0-44.9, adult ([place]) 04/14/2016  . Hypertension, essential 04/13/2016  . Vitamin D deficiency 04/13/2016  .  Insomnia 04/13/2016  . Generalized anxiety disorder 04/13/2016  . 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 Osteoarthritis, 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/13/2016  . Dyspnea 10/07/2015   Past Medical History:  Diagnosis Date  . Anemia   . Anxiety   . Arthritis   . Crohn's disease ([place])   . Depression   . GERD (gastroesophageal reflux disease)   . Heart murmur   . Hypertension   . Reflux     Family History  Problem Relation Age of Onset  . Heart disease Mother        RF  . Stroke Father   . Diabetes Father   . Hyperlipidemia Father   . Hypertension Father     Past Surgical History:    Procedure Laterality Date  . ANKLE SURGERY    . CARPAL TUNNEL RELEASE    . CHOLECYSTECTOMY    . COLON SURGERY    . RIGHT/LEFT HEART CATH AND CORONARY ANGIOGRAPHY N/A 09/15/2016   Procedure: RIGHT/LEFT HEART CATH AND CORONARY ANGIOGRAPHY;  Surgeon: Larey Dresser, MD;  Location: [facility];  Service: Cardiovascular;  Laterality: N/A;   Social History   Occupational History  . Not on file  Tobacco Use  . Smoking status: Never Smoker  . Smokeless tobacco: Never Used  Substance and Sexual Activity  . Alcohol use: No  . Drug use: No  . Sexual activity: Not Currently

## 2018-04-30 ENCOUNTER — Other Ambulatory Visit (HOSPITAL_COMMUNITY): Payer: Self-pay | Admitting: Cardiology

## 2018-05-09 ENCOUNTER — Other Ambulatory Visit (HOSPITAL_COMMUNITY): Payer: Self-pay | Admitting: Cardiology

## 2018-06-04 DIAGNOSIS — K5090 Crohn's disease, unspecified, without complications: Secondary | ICD-10-CM | POA: Diagnosis not present

## 2018-06-06 ENCOUNTER — Other Ambulatory Visit: Payer: Self-pay | Admitting: Family Medicine

## 2018-06-29 ENCOUNTER — Other Ambulatory Visit: Payer: Self-pay | Admitting: Family Medicine

## 2018-06-29 ENCOUNTER — Telehealth: Payer: Self-pay | Admitting: *Deleted

## 2018-06-29 MED ORDER — TRAZODONE HCL 100 MG PO TABS: 100.0000 mg | ORAL_TABLET | Freq: Every day | ORAL | 1 refills | Status: DC

## 2018-06-29 NOTE — Telephone Encounter (Signed)
Rx for Trazodone sent. Thanks, BJ

## 2018-06-29 NOTE — Telephone Encounter (Signed)
Refill request for Trazodone 100 mg tablet

## 2018-07-09 ENCOUNTER — Other Ambulatory Visit: Payer: Self-pay | Admitting: Family Medicine

## 2018-07-09 DIAGNOSIS — F331 Major depressive disorder, recurrent, moderate: Secondary | ICD-10-CM

## 2018-07-09 DIAGNOSIS — F411 Generalized anxiety disorder: Secondary | ICD-10-CM

## 2018-08-21 ENCOUNTER — Ambulatory Visit: Payer: PPO | Admitting: Family Medicine

## 2018-11-05 ENCOUNTER — Telehealth: Payer: Self-pay | Admitting: Orthopaedic Surgery

## 2018-11-05 NOTE — Telephone Encounter (Signed)
Patient called advised she saw DR Erlinda Hong 01/31/2017 and forgot what the diagnosis was for her right leg and right hip. The number to contact patient is [phone]

## 2018-11-05 NOTE — Telephone Encounter (Signed)
Patient aware her xray showed knee arthritis

## 2018-12-06 ENCOUNTER — Other Ambulatory Visit: Payer: Self-pay | Admitting: Family Medicine

## 2018-12-06 DIAGNOSIS — F411 Generalized anxiety disorder: Secondary | ICD-10-CM

## 2018-12-06 DIAGNOSIS — F331 Major depressive disorder, recurrent, moderate: Secondary | ICD-10-CM

## 2018-12-14 ENCOUNTER — Other Ambulatory Visit: Payer: Self-pay | Admitting: Family Medicine

## 2018-12-17 ENCOUNTER — Other Ambulatory Visit: Payer: Self-pay

## 2019-02-11 ENCOUNTER — Other Ambulatory Visit: Payer: Self-pay | Admitting: Family Medicine

## 2019-02-12 ENCOUNTER — Telehealth: Payer: Self-pay

## 2019-02-12 NOTE — Telephone Encounter (Signed)
Can you get pt scheduled for a virtual visit? She hasn't been seen since 01/2018. Thanks!

## 2019-02-12 NOTE — Telephone Encounter (Signed)
Can you please arrange a f/u appt. Last visit 01/2018. Video or tel visit, ideally the former one. Thanks, BJ

## 2019-02-21 NOTE — Telephone Encounter (Signed)
The patient is scheduled for a telephone visit on 02/25/2019 with Dr. Martinique

## 2019-02-25 ENCOUNTER — Other Ambulatory Visit: Payer: Self-pay

## 2019-02-25 ENCOUNTER — Telehealth (INDEPENDENT_AMBULATORY_CARE_PROVIDER_SITE_OTHER): Payer: PPO | Admitting: Family Medicine

## 2019-02-25 ENCOUNTER — Encounter: Payer: Self-pay | Admitting: Family Medicine

## 2019-02-25 VITALS — Ht <= 58 in

## 2019-02-25 DIAGNOSIS — I10 Essential (primary) hypertension: Secondary | ICD-10-CM

## 2019-02-25 DIAGNOSIS — F411 Generalized anxiety disorder: Secondary | ICD-10-CM

## 2019-02-25 DIAGNOSIS — G4700 Insomnia, unspecified: Secondary | ICD-10-CM | POA: Diagnosis not present

## 2019-02-25 DIAGNOSIS — E876 Hypokalemia: Secondary | ICD-10-CM | POA: Diagnosis not present

## 2019-02-25 DIAGNOSIS — K5090 Crohn's disease, unspecified, without complications: Secondary | ICD-10-CM | POA: Diagnosis not present

## 2019-02-25 DIAGNOSIS — K219 Gastro-esophageal reflux disease without esophagitis: Secondary | ICD-10-CM | POA: Diagnosis not present

## 2019-02-25 DIAGNOSIS — D509 Iron deficiency anemia, unspecified: Secondary | ICD-10-CM

## 2019-02-25 DIAGNOSIS — R5382 Chronic fatigue, unspecified: Secondary | ICD-10-CM | POA: Diagnosis not present

## 2019-02-25 DIAGNOSIS — F331 Major depressive disorder, recurrent, moderate: Secondary | ICD-10-CM

## 2019-02-25 DIAGNOSIS — E538 Deficiency of other specified B group vitamins: Secondary | ICD-10-CM

## 2019-02-25 DIAGNOSIS — E559 Vitamin D deficiency, unspecified: Secondary | ICD-10-CM

## 2019-02-25 DIAGNOSIS — M159 Polyosteoarthritis, unspecified: Secondary | ICD-10-CM

## 2019-02-25 NOTE — Progress Notes (Signed)
Virtual Visit via Telephone Note  I connected with Kristen Gray on 02/25/19 at  4:00 PM EST by telephone and verified that I am speaking with the correct person using two identifiers.   I discussed the limitations, risks, security and privacy concerns of performing an evaluation and management service by telephone and the availability of in person appointments. I also discussed with the patient that there may be a patient responsible charge related to this service. The patient expressed understanding and agreed to proceed.  Location patient: home Location provider: work office Participants present for the call: patient, provider Patient did not have a visit in the prior 7 days to address this/these issue(s).   History of Present Illness: Kristen Gray is a 74 yo female being seen today for chronic disease management. No new problems since her last visit, 02/20/18.  HypoK+, she is not taking KCL. HTN: She is on Atenolol-HCTZ 50-25 mg daily.  Negative for severe/frequent headache, visual changes, chest pain, dyspnea, palpitation, focal weakness, or edema. She has not checked BP's at home. Hx of mild aortic regurgitation and pulmonary HTN (37 mm Hg) She has hx of OSA, she is not on CPAP. Has seen cardiologist. Echo 09/2016: LVEF 55-60%  Lab Results  Component Value Date   CREATININE 1.20 02/20/2018   BUN 21 02/20/2018   NA 137 02/20/2018   K 4.3 02/20/2018   CL 99 02/20/2018   CO2 30 02/20/2018   Feeling dizzy sometimes. Exacerbated by getting up fast. Negative for syncope or Kristen changes.  -Vit B12 deficiency: She is oral B12 supplementation, 2000 mcg daily. Fatigue "all the time", work up in the past negative.  A few years ago she received a B12 injection and felt more anergic for months.  Vit D deficiency: She is on Vit D 800 U daily.  She is on iron supplementation and wonders if she needs to take it daily. She has not seen blood in stool. Black stools,attributed to  iron.  Lab Results  Component Value Date   WBC 8.5 02/28/2017   HGB 12.3 02/28/2017   HCT 38.7 02/28/2017   MCV 104.2 (H) 02/28/2017   PLT 233.0 02/28/2017   Crohn's disease, she follows with GI. Dx;ed in 1986, since then she has been on Sulfasalazine 500 mg bid. S/P partial colectomy. Dr Oletta Lamas retired. Having generalized achy like pain, mild. Not sure about exacerbating or alleviating factors.  Negative for fever,chills,or night sweats.  Most of the time formed stools, sometimes they are shaped as "ribbons" and loose stools other times. "Not debilitating." No associated N/V,changes in bowel habits,or urinary symptoms.   Insomnia: Sleeping better. Sleeps about 6 hours. She takes Trazodone 100 mg at bedtime.  Anxiety and depression: Stable. She is on Effexor XR 75 mg daily. Depression has never been well controlled but has been better with Effexor. In general she is dealing well with COVID 19 pandemia and restrictions. She keeps in communication with her sister.  OA: Mainly knees, L>R and right hip. No recent falls.  Exacerbated by prolonged walking/standing. Alleviated by rest. Follows with ortho, Dr. Erlinda Hong   Observations/Objective: Patient sounds cheerful and well on the phone. I do not appreciate any SOB. Speech and thought processing are grossly intact. Patient reported vitals:Ht 4' 10"  (1.473 m)   BMI 33.86 kg/m    Assessment and Plan: Orders Placed This Encounter  Procedures  . Comprehensive metabolic panel  . CBC  . Vitamin B12  . VITAMIN D 25 Hydroxy (Vit-D Deficiency, Fractures)  .  Ferritin     Hypertension, essential Recommend getting a BP monitor to check BP at home. No changes in current management. We discussed some side effects of antihypertensive medication, including orthostatic hypotension. Continue low-salt diet.  GERD (gastroesophageal reflux disease) Problem is well controlled. Continue omeprazole 20 mg daily. GERD precautions also  recommended.  Osteoarthritis, generalized We discussed diagnosis, prognosis, and treatment options. Continue Tylenol 500 mg 3-4 times per day as needed. Following with orthopedics for knee OA. Fall precautions discussed.  Generalized anxiety disorder Problem is a stable. Continue Effexor XR 75 mg daily and trazodone 100 mg daily.  Vitamin D deficiency No changes in current management. Further recommendation will be given according to 25 OH vitamin D result.  Hypokalemia For now continue potassium rich diet. We will resume KCl depending on BMP results.  Anemia We discussed some side effects of iron supplementation, including constipation. I recommend holding on iron supplementation for now, recommendation will be given according to CBC and ferritin levels.  Chronic fatigue A few of her chronic medical problems could be contributing factors. Problem has been stable overall.  Major depression Problem is stable. Continue Effexor XR 75 mg daily. We discussed some side effects of medication.  Insomnia Problem is well controlled. Continue trazodone 100 mg daily at bedtime. We discussed some side effects of medication and the risk of interaction with some of her other medications. Good sleep hygiene.   Crohn disease ([place]) Having mild symptoms. No changes in current management. She follows with GI, Dr. Oletta Lamas has retired, she needs to establish with new provider.  Vitamin B 12 deficiency Continue B12 supplementation for now,same dose. We will adjust dose according to B12 result. We could consider parenteral treatment every few weeks instead oral B12.    Follow Up Instructions:  Return in about 6 months (around 08/25/2019) for Labs this week and f/u in 5-6 months..  I did not refer this patient for an OV in the next 24 hours for this/these issue(s).  I discussed the assessment and treatment plan with the patient.Kristen Gray was provided an opportunity to ask questions  and all were answered. She agreed with the plan and demonstrated an understanding of the instructions.    I provided 34 minutes of non-face-to-face time during this encounter.   Cayne Yom Martinique, MD

## 2019-02-25 NOTE — Assessment & Plan Note (Signed)
A few of her chronic medical problems could be contributing factors. Problem has been stable overall.

## 2019-02-25 NOTE — Assessment & Plan Note (Signed)
For now continue potassium rich diet. We will resume KCl depending on BMP results.

## 2019-02-25 NOTE — Assessment & Plan Note (Signed)
Problem is well controlled. Continue trazodone 100 mg daily at bedtime. We discussed some side effects of medication and the risk of interaction with some of her other medications. Good sleep hygiene.

## 2019-02-25 NOTE — Assessment & Plan Note (Signed)
Continue B12 supplementation for now,same dose. We will adjust dose according to B12 result. We could consider parenteral treatment every few weeks instead oral B12.

## 2019-02-25 NOTE — Assessment & Plan Note (Signed)
Having mild symptoms. No changes in current management. She follows with GI, Dr. Oletta Lamas has retired, she needs to establish with new provider.

## 2019-02-25 NOTE — Assessment & Plan Note (Signed)
Recommend getting a BP monitor to check BP at home. No changes in current management. We discussed some side effects of antihypertensive medication, including orthostatic hypotension. Continue low-salt diet.

## 2019-02-25 NOTE — Assessment & Plan Note (Signed)
No changes in current management. Further recommendation will be given according to 25 OH vitamin D result.

## 2019-02-25 NOTE — Assessment & Plan Note (Signed)
We discussed diagnosis, prognosis, and treatment options. Continue Tylenol 500 mg 3-4 times per day as needed. Following with orthopedics for knee OA. Fall precautions discussed.

## 2019-02-25 NOTE — Assessment & Plan Note (Signed)
Problem is a stable. Continue Effexor XR 75 mg daily and trazodone 100 mg daily.

## 2019-02-25 NOTE — Assessment & Plan Note (Addendum)
Problem is well controlled. Continue omeprazole 20 mg daily. GERD precautions also recommended.

## 2019-02-25 NOTE — Assessment & Plan Note (Signed)
Problem is stable. Continue Effexor XR 75 mg daily. We discussed some side effects of medication.

## 2019-02-25 NOTE — Assessment & Plan Note (Signed)
We discussed some side effects of iron supplementation, including constipation. I recommend holding on iron supplementation for now, recommendation will be given according to CBC and ferritin levels.

## 2019-03-01 NOTE — Progress Notes (Signed)
Patient is scheduled for 08/26/2018 at 9:30

## 2019-03-11 ENCOUNTER — Telehealth: Payer: Self-pay | Admitting: Family Medicine

## 2019-03-11 NOTE — Chronic Care Management (AMB) (Signed)
  Chronic Care Management   Note  03/11/2019 Name: Bethel Sirois MRN: [id number] DOB: 08-Jan-1946  Imonie Tuch is a 74 y.o. year old female who is a primary care patient of Martinique, Malka So, MD. I reached out to Vladimir Faster by phone today in response to a referral sent by Ms. Pricilla Holm Kilker's PCP, Martinique, Betty G, MD.   Ms. Sandberg was given information about Chronic Care Management services today including:  1. CCM service includes personalized support from designated clinical staff supervised by her physician, including individualized plan of care and coordination with other care providers 2. 24/7 contact phone numbers for assistance for urgent and routine care needs. 3. Service will only be billed when office clinical staff spend 20 minutes or more in a month to coordinate care. 4. Only one practitioner may furnish and bill the service in a calendar month. 5. The patient may stop CCM services at any time (effective at the end of the month) by phone call to the office staff. 6. The patient will be responsible for cost sharing (co-pay) of up to 20% of the service fee (after annual deductible is met).  Patient agreed to services and verbal consent obtained.   Follow up plan:   Raynicia Dukes [employer] Scheduler

## 2019-03-14 ENCOUNTER — Ambulatory Visit: Payer: PPO

## 2019-03-14 ENCOUNTER — Other Ambulatory Visit: Payer: Self-pay

## 2019-03-14 ENCOUNTER — Telehealth: Payer: Self-pay

## 2019-03-14 DIAGNOSIS — M159 Polyosteoarthritis, unspecified: Secondary | ICD-10-CM

## 2019-03-14 DIAGNOSIS — F411 Generalized anxiety disorder: Secondary | ICD-10-CM

## 2019-03-14 DIAGNOSIS — I10 Essential (primary) hypertension: Secondary | ICD-10-CM

## 2019-03-14 NOTE — Chronic Care Management (AMB) (Signed)
Chronic Care Management Pharmacy  Name: Kristen Gray  MRN: [id number] DOB: [birth year]  Initial Questions: 1. Have you seen any other providers since your last visit? No  2. Any changes in your medicines or health? No   Chief Complaint/ HPI  Kristen Gray,  74 y.o. , female presents for their Initial CCM visit with the clinical pharmacist via telephone.  PCP : Gray, Kristen G, MD  Their chronic conditions include: HTN, GERD, Crohn's disease, Osteoarthritis, insomnia, Depression, Vitamin B12 deficiency, Dry eyes   Office Visits: 02/25/2019- Patient presented via telemedicine to Dr. Betty Martinique, MD for HTN follow up. Recommended patient to obtain BP monitor and check at home. Patient to continue current medications and low salt diet. Patient to hold iron due to constipation, and will reassess after lab work. Patient to continue potassium rich diet and will resume potassium supplementation depending on BMP results.    Consult Visit: 03/27/2018- Orthopedics- Patient presented in office to Dr. Frankey Shown, MD for osteoarthritis. Patient to obtain cortisone injections. Discussion about viscosupplementation injections if cortisone injections fail. Patient to return depending on symptoms improvement.   Medications: Outpatient Encounter Medications as of 03/14/2019  Medication Sig  . acetaminophen (TYLENOL) 500 MG tablet Take 1,000 mg by mouth every 6 (six) hours as needed (FOR PAIN.).  Marland Kitchen atenolol-chlorthalidone (TENORETIC) 50-25 MG tablet TAKE 1 TABLET BY MOUTH EVERY DAY  . Calcium Carbonate-Vitamin D (CALTRATE 600+D PO) Take 2 tablets by mouth daily.  . folic acid (FOLVITE) 1 MG tablet Take 1 mg by mouth daily.  Marland Kitchen omeprazole (PRILOSEC) 20 MG capsule Take 20 mg by mouth daily before breakfast.  . Polyethyl Glycol-Propyl Glycol (SYSTANE OP) Apply 1 drop to eye 2 (two) times daily.  Marland Kitchen sulfaSALAzine (AZULFIDINE) 500 MG tablet TAKE TWO TABLETS (1000 MG) BY MOUTH TWICE DAILY  . traZODone  (DESYREL) 100 MG tablet TAKE 1 TABLET BY MOUTH AT BEDTIME  . venlafaxine XR (EFFEXOR-XR) 75 MG 24 hr capsule TAKE 1 CAPSULE BY MOUTH EVERY DAY WITH BREAKFAST  . vitamin B-12 (CYANOCOBALAMIN) 1000 MCG tablet Take 2,000 mcg by mouth daily.  Marland Kitchen albuterol (PROAIR HFA) 108 (90 Base) MCG/ACT inhaler Inhale 2 puffs into the lungs every 6 (six) hours as needed for wheezing or shortness of breath. (Patient not taking: Reported on 03/14/2019)  . cycloSPORINE (RESTASIS) 0.05 % ophthalmic emulsion Place 2 drops into both eyes 2 (two) times daily.  . Glucosamine Sulfate 500 MG TABS Take by mouth.  Marland Kitchen ibuprofen (ADVIL,MOTRIN) 800 MG tablet TAKE 1 TABLET BY MOUTH 3 TIMES DAILY AS NEEDED FOR DISCOMFORT  . neomycin-polymyxin b-dexamethasone (MAXITROL) 3.5-10000-0.1 SUSP instill 1 drop into BOTH eyes 4 TIMES DAILY AS DIRECTED  . prednisoLONE acetate (PRED FORTE) 1 % ophthalmic suspension Instill 1 drop into both eyes every two hours while awake  . valACYclovir (VALTREX) 1000 MG tablet Take 1,000 mg by mouth 3 (three) times daily.   No facility-administered encounter medications on file as of 03/14/2019.     Current Diagnosis/Assessment:  Goals Addressed            This Visit's Progress   . Pharmacy Care Plan       Current Barriers:  . Chronic Disease Management support, education, and care coordination needs related to HTN, GERD, Crohn's disease, Osteoarthritis, Insomnia, Depression, Vitamin B12 deficiency, Dry eyes   Pharmacist Clinical Goal(s):  Marland Kitchen Maintain blood pressure within goal of your provider (130/80 or 140/90)  . Stay active, engage in at least 150 minutes per  week of moderate-intensity exercise such as brisk walking (15- to 20-minute mile) or something similar.  . Minimize reflux symptoms  . Continue working on lifestyle modifications (diet/exercise). . Improve sleep.   Interventions: . Comprehensive medication review performed. . Discussed DASH diet:  following a diet emphasizing fruits and  vegetables and low-fat dairy products along with whole grains, fish, poultry, and nuts. Reducing red meats and sugars.  . Discussed incorporating calcium in diet (milk, yogurt, ice cream, etc.) . Discussed non-pharmacological interventions for acid reflux. Take measures to prevent acid reflux, such as avoiding spicy foods, avoiding caffeine, avoid laying down a few hours after eating, and raising the head of the bed. . Discussed non-pharmacological interventions for insomnia. Avoid napping, limit exposure to technology near bedtime, etc). . Discussed importance of taking medications as directed.   Patient Self Care Activities:  . Calls provider office for new concerns or questions . Continue current medications as directed by providers.  . Continue following up with specialists. . Continue at home blood pressure readings.  Initial goal documentation        Hypertension   Office blood pressures are  BP Readings from Last 3 Encounters:  02/20/18 122/60  07/28/17 120/82  02/28/17 130/68    Patient has failed these meds in the past: none   Patient checks BP at home has not been checking since she lost her BP monitor. She is in process of buying another one.    Patient controlled on: atenolol/chlorthalidone 50/50m, 1 tablet daily.   We discussed diet and exercise extensively  - DASH diet:  following a diet emphasizing fruits and vegetables and low-fat dairy products along with whole grains, fish, poultry, and nuts. Reducing red meats and sugars.  - Reducing the amount of salt intake to <15026mper day.    Plan Denies persistent dizziness. Discuss taking time to stand up when dizziness is present.  Patient to buy BP monitor and check BP. Will follow up with patient in a month for BP readings.  Continue current medications   GERD  Patient has failed these meds in past: Mylanta, Baking Soda (elevated BP)  Patient is currently controlled on the following medications:  -  omeprazole 2015m1 capsule daily  We discussed:  Discussed non-pharmacological interventions for acid reflux. Take measures to prevent acid reflux, such as avoiding spicy foods, avoiding caffeine, avoid laying down a few hours after eating, and raising the head of the bed.  Patient's trigger foods include: soda, OJ  Plan Managed by Dr. EdwOletta Lamasince being on omeprazole and modifying diet, patient states it keeps her symptoms controlled. Continue current medications and diet modifications.   Crohn's disease   Patient has failed these meds in past: patient states being on another medication (name unknown) but discontinued due to inefficacy.  Patient is currently controlled on the following medications:  - sulfasalazine 500m27m tablets twice daily  - folic acid 1 mg, 1 tablet daily  Plan Managed by Dr. EdwaOletta Lamasontinue current medications.   Osteoarthritis   Patient has failed these meds in past: none  Patient is currently controlled on the following medications:  - APAP 500mg51mtablets every six hours as needed for pain (patient takes infrequently).   Plan Continue current medications  Depression   Patient has failed these meds in past: Citalopram, doxepin, mirtazapine,  Patient is currently controlled on the following medications - venlafaxine (Effexor XR) 75mg,28mapsule daily  We discussed:  monitoring for serotonin syndrome symptoms (i.e. sweating,  tremors, hyperreflexia, etc.).  Plan Managed by Dr. Martinique. Patient notes current regimen helps calm her nerves.  Continue current medications.   Insomnia   Patient has failed these meds in past: has tried number of drugs (names unknown) Patient is currently controlled on the following medications:  - trazodone 168m, 1 tablet at bedtime  We discussed: non-pharmacological interventions (avoid napping, limit exposure to technology near bedtime, etc).   Plan Patient states trazodone is the only thing that has worked for  her for insomnia.  Continue current medications.   Bone health   Patient is currently managed on the following medications: - calcium carbonate/ vitamin D, 6020m 2 tablet once daily.   We discussed:  increase calcium through diet (milk, yogurt, ice cream).   Plan Continue current medications.   Vitamin B deficiency  Patient is currently managed on the following medications:  - vitamin B-12, 100035m2 tablets once daily.   Patient states she received vitamin B injections in the past, and now managed on oral tablets.   Plan Continue current medications.   Dry Eyes   Patient is currently controlled on the following medications:  - Systane, 1 drop twice daily.   Patient states she used Restasis, but was told she didn't need it and could be managed with Systane eye drops.   Plan Continue current medications  Medication Management   Patient manages medications: by aligning bottles. She endorses adherence to medications.  Follow up Follow up appointment in 1 year.   AnnAnson CroftsharmD Clinical Pharmacist [phone]

## 2019-03-14 NOTE — Patient Instructions (Addendum)
Visit Information  Goals Addressed            This Visit's Progress   . Pharmacy Care Plan       Current Barriers:  . Chronic Disease Management support, education, and care coordination needs related to HTN, GERD, Crohn's disease, Osteoarthritis, Insomnia, Depression, Vitamin B12 deficiency, Dry eyes   Pharmacist Clinical Goal(s):  Marland Kitchen Maintain blood pressure within goal of your provider (130/80 or 140/90)  . Stay active, engage in at least 150 minutes per week of moderate-intensity exercise such as brisk walking (15- to 20-minute mile) or something similar.  . Minimize reflux symptoms  . Continue working on lifestyle modifications (diet/exercise). . Improve sleep.   Interventions: . Comprehensive medication review performed. . Discussed DASH diet:  following a diet emphasizing fruits and vegetables and low-fat dairy products along with whole grains, fish, poultry, and nuts. Reducing red meats and sugars.  . Discussed incorporating calcium in diet (milk, yogurt, ice cream, etc.) . Discussed non-pharmacological interventions for acid reflux. Take measures to prevent acid reflux, such as avoiding spicy foods, avoiding caffeine, avoid laying down a few hours after eating, and raising the head of the bed. . Discussed non-pharmacological interventions for insomnia. Avoid napping, limit exposure to technology near bedtime, etc). . Discussed importance of taking medications as directed.   Patient Self Care Activities:  . Calls provider office for new concerns or questions . Continue current medications as directed by providers.  . Continue following up with specialists. . Continue at home blood pressure readings.  Initial goal documentation        Kristen Gray was given information about Chronic Care Management services today including:  1. CCM service includes personalized support from designated clinical staff supervised by her physician, including individualized plan of care and  coordination with other care providers 2. 24/7 contact phone numbers for assistance for urgent and routine care needs. 3. Service will only be billed when office clinical staff spend 20 minutes or more in a month to coordinate care. 4. Only one practitioner may furnish and bill the service in a calendar month. 5. The patient may stop CCM services at any time (effective at the end of the month) by phone call to the office staff. 6. The patient will be responsible for cost sharing (co-pay) of up to 20% of the service fee (after annual deductible is met).  Patient agreed to services and verbal consent obtained.   The patient verbalized understanding of instructions provided today and agreed to receive a mailed copy of patient instruction and/or educational materials. Telephone follow up appointment with pharmacy team member scheduled for: 03/13/2020  Anson Crofts, PharmD Clinical Pharmacist [facility] [phone]  Eating Plan for Osteoporosis Osteoporosis causes your bones to become weak and brittle. This puts you at greater risk for bone breaks (fractures) from small bumps or falls. Making changes to your diet and increasing your physical activity can help strengthen your bones and improve your overall health. Calcium and vitamin D are nutrients that play an important role in bone health. Vitamin D helps your body use calcium and strengthen bones. Therefore, it is important to get enough calcium and vitamin D as part of your eating plan for osteoporosis. What are tips for following this plan? Reading food labels  Try to get at least 1,000 milligrams (mg) of calcium each day.  Look for foods that have at least 50 mg of calcium per serving.  Talk with your health care  provider about taking a calcium supplement if you do not get enough calcium from food.  Do not have more than 2,500 mg of calcium each day. This is the upper limit for food and nutritional  supplements combined. Too much calcium may cause constipation and prevent you from absorbing other important nutrients.  Choose foods that contain vitamin D.  Take a daily vitamin supplement that contains 800-1,000 international units (IU) of vitamin D. The amount may be different depending on your age, body weight, ethnicity, and where you live. Talk with your dietitian or health care provider about how much vitamin D is right for you.  Avoid foods that have more than 300 mg of sodium per serving. Too much sodium can cause your body to lose calcium.  Talk with your dietitian or health care provider about how much sodium you are allowed each day. Shopping  Do not buy foods with added salt, including: ? Salted snacks. ? Angie Fava. ? Canned soups. ? Canned meats. ? Processed meats, such as bacon or cold cuts. ? Smoked fish. Meal planning  Eat balanced meals that contain protein foods, fruits and vegetables, and foods rich in calcium and vitamin D.  Eat at least 5 servings of fruits and vegetables each day.  Eat 5-6 oz. of lean meat, poultry, fish, eggs, or beans each day. Lifestyle  Do not use any products that contain nicotine or tobacco, such as cigarettes and e-cigarettes. If you need help quitting, ask your health care provider.  If your health care provider recommends that you lose weight: ? Work with a dietitian to develop an eating plan that will help you reach your desired weight goal. ? Exercise for at least 30 minutes a day, 5 or more days a week, or as told by your health care provider.  Work with a physical therapist to develop an exercise plan that includes flexibility, balance, and strength exercises.  If you drink alcohol, limit how much you have. This means: ? 0-1 drink a day for women. ? 0-2 drinks a day for men. ? Be aware of how much alcohol is in your drink. In the [place] one drink equals one typical bottle of beer (12 oz), one-half glass of wine (5 oz), or one  shot of hard liquor (1 oz). What foods should I eat? Foods high in calcium   Yogurt. Yogurt with fruit.  Milk. Evaporated skim milk. Dry milk powder.  Calcium-fortified orange juice.  Parmesan cheese. Part-skim ricotta cheese. Natural hard cheese. Cream cheese. Cottage cheese.  Canned sardines. Canned salmon.  Calcium-treated tofu. Calcium-fortified cereal bar. Calcium-fortified cereal. Calcium-fortified graham crackers.  Cooked collard greens. Turnip greens. Broccoli. Kale.  Almonds.  White beans.  Corn tortilla. Foods high in vitamin D  Cod liver oil. Fatty fish, such as tuna, mackerel, and salmon.  Milk. Fortified soy milk. Fortified fruit juice.  Yogurt. Margarine.  Egg yolks. Foods high in protein  Beef. Lamb. Pork tenderloin.  Chicken breast.  Tuna (canned). Fish fillet.  Tofu.  Soy beans (cooked). Soy patty. Beans (canned or cooked).  Cottage cheese.  Yogurt.  Peanut butter.  Pumpkin seeds. Nuts. Sunflower seeds.  Hard cheese.  Milk or other milk products, such as soy milk. The items listed above may not be a complete list of foods and beverages you can eat. Contact a dietitian for more options. Summary  Calcium and vitamin D are nutrients that play an important role in bone health and are an important part of your eating plan for  osteoporosis.  Eat balanced meals that contain protein foods, fruits and vegetables, and foods rich in calcium and vitamin D.  Avoid foods that have more than 300 mg of sodium per serving. Too much sodium can cause your body to lose calcium.  Exercise is an important part of prevention and treatment of osteoporosis. Aim for at least 30 minutes a day, 5 days a week. This information is not intended to replace advice given to you by your health care provider. Make sure you discuss any questions you have with your health care provider. Document Revised: 03/20/2017 Document Reviewed: 03/20/2017 Elsevier Patient  Education  [place].  Insomnia Insomnia is a sleep disorder that makes it difficult to fall asleep or stay asleep. Insomnia can cause fatigue, low energy, difficulty concentrating, mood swings, and poor performance at work or school. There are three different ways to classify insomnia:  Difficulty falling asleep.  Difficulty staying asleep.  Waking up too early in the morning. Any type of insomnia can be long-term (chronic) or short-term (acute). Both are common. Short-term insomnia usually lasts for three months or less. Chronic insomnia occurs at least three times a week for longer than three months. What are the causes? Insomnia may be caused by another condition, situation, or substance, such as:  Anxiety.  Certain medicines.  Gastroesophageal reflux disease (GERD) or other gastrointestinal conditions.  Asthma or other breathing conditions.  Restless legs syndrome, sleep apnea, or other sleep disorders.  Chronic pain.  Menopause.  Stroke.  Abuse of alcohol, tobacco, or illegal drugs.  Mental health conditions, such as depression.  Caffeine.  Neurological disorders, such as Alzheimer's disease.  An overactive thyroid (hyperthyroidism). Sometimes, the cause of insomnia may not be known. What increases the risk? Risk factors for insomnia include:  Gender. Women are affected more often than men.  Age. Insomnia is more common as you get older.  Stress.  Lack of exercise.  Irregular work schedule or working night shifts.  Traveling between different time zones.  Certain medical and mental health conditions. What are the signs or symptoms? If you have insomnia, the main symptom is having trouble falling asleep or having trouble staying asleep. This may lead to other symptoms, such as:  Feeling fatigued or having low energy.  Feeling nervous about going to sleep.  Not feeling rested in the morning.  Having trouble concentrating.  Feeling  irritable, anxious, or depressed. How is this diagnosed? This condition may be diagnosed based on:  Your symptoms and medical history. Your health care provider may ask about: ? Your sleep habits. ? Any medical conditions you have. ? Your mental health.  A physical exam. How is this treated? Treatment for insomnia depends on the cause. Treatment may focus on treating an underlying condition that is causing insomnia. Treatment may also include:  Medicines to help you sleep.  Counseling or therapy.  Lifestyle adjustments to help you sleep better. Follow these instructions at home: Eating and drinking   Limit or avoid alcohol, caffeinated beverages, and cigarettes, especially close to bedtime. These can disrupt your sleep.  Do not eat a large meal or eat spicy foods right before bedtime. This can lead to digestive discomfort that can make it hard for you to sleep. Sleep habits   Keep a sleep diary to help you and your health care provider figure out what could be causing your insomnia. Write down: ? When you sleep. ? When you wake up during the night. ? How well you sleep. ?  How rested you feel the next day. ? Any side effects of medicines you are taking. ? What you eat and drink.  Make your bedroom a dark, comfortable place where it is easy to fall asleep. ? Put up shades or blackout curtains to block light from outside. ? Use a white noise machine to block noise. ? Keep the temperature cool.  Limit screen use before bedtime. This includes: ? Watching TV. ? Using your smartphone, tablet, or computer.  Stick to a routine that includes going to bed and waking up at the same times every day and night. This can help you fall asleep faster. Consider making a quiet activity, such as reading, part of your nighttime routine.  Try to avoid taking naps during the day so that you sleep better at night.  Get out of bed if you are still awake after 15 minutes of trying to sleep.  Keep the lights down, but try reading or doing a quiet activity. When you feel sleepy, go back to bed. General instructions  Take over-the-counter and prescription medicines only as told by your health care provider.  Exercise regularly, as told by your health care provider. Avoid exercise starting several hours before bedtime.  Use relaxation techniques to manage stress. Ask your health care provider to suggest some techniques that may work well for you. These may include: ? Breathing exercises. ? Routines to release muscle tension. ? Visualizing peaceful scenes.  Make sure that you drive carefully. Avoid driving if you feel very sleepy.  Keep all follow-up visits as told by your health care provider. This is important. Contact a health care provider if:  You are tired throughout the day.  You have trouble in your daily routine due to sleepiness.  You continue to have sleep problems, or your sleep problems get worse. Get help right away if:  You have serious thoughts about hurting yourself or someone else. If you ever feel like you may hurt yourself or others, or have thoughts about taking your own life, get help right away. You can go to your nearest emergency department or call:  Your local emergency services (911 in the [place]  A suicide crisis helpline, such as the [place] [facility] at (225)461-4099. This is open 24 hours a day. Summary  Insomnia is a sleep disorder that makes it difficult to fall asleep or stay asleep.  Insomnia can be long-term (chronic) or short-term (acute).  Treatment for insomnia depends on the cause. Treatment may focus on treating an underlying condition that is causing insomnia.  Keep a sleep diary to help you and your health care provider figure out what could be causing your insomnia. This information is not intended to replace advice given to you by your health care provider. Make sure you discuss any questions you have with  your health care provider. Document Revised: 12/23/2016 Document Reviewed: 10/20/2016 Elsevier Patient Education  2020 [employer].

## 2019-03-14 NOTE — Telephone Encounter (Signed)
I am requesting an ambulatory referral to CCM be placed for this patient. Referral will need to have 2 current diagnosis attached to it. Thank you!  

## 2019-03-18 NOTE — Telephone Encounter (Signed)
It is ok to place referral as requested. Thanks, BJ

## 2019-03-19 NOTE — Telephone Encounter (Signed)
Referral entered  

## 2019-04-02 ENCOUNTER — Other Ambulatory Visit: Payer: Self-pay | Admitting: Family Medicine

## 2019-04-02 DIAGNOSIS — F411 Generalized anxiety disorder: Secondary | ICD-10-CM

## 2019-04-02 DIAGNOSIS — F331 Major depressive disorder, recurrent, moderate: Secondary | ICD-10-CM

## 2019-06-14 ENCOUNTER — Other Ambulatory Visit: Payer: Self-pay | Admitting: Family Medicine

## 2019-06-25 ENCOUNTER — Other Ambulatory Visit: Payer: Self-pay | Admitting: Family Medicine

## 2019-07-02 ENCOUNTER — Other Ambulatory Visit: Payer: Self-pay | Admitting: *Deleted

## 2019-07-02 ENCOUNTER — Telehealth: Payer: Self-pay | Admitting: Family Medicine

## 2019-07-02 MED ORDER — OMEPRAZOLE 20 MG PO CPDR: 20.0000 mg | DELAYED_RELEASE_CAPSULE | Freq: Every day | ORAL | 0 refills | Status: DC

## 2019-07-02 NOTE — Telephone Encounter (Signed)
Rx sent to pharmacy as requested.

## 2019-07-02 NOTE — Telephone Encounter (Signed)
Pt is calling to get a refill on omeprazole 20 MG and only have 2 pills. Pharm:  [facility] on [address].

## 2019-08-26 ENCOUNTER — Ambulatory Visit: Payer: PPO | Admitting: Family Medicine

## 2019-08-26 ENCOUNTER — Other Ambulatory Visit: Payer: Self-pay | Admitting: Family Medicine

## 2019-08-26 DIAGNOSIS — F331 Major depressive disorder, recurrent, moderate: Secondary | ICD-10-CM

## 2019-08-26 DIAGNOSIS — F411 Generalized anxiety disorder: Secondary | ICD-10-CM

## 2019-09-05 ENCOUNTER — Other Ambulatory Visit: Payer: Self-pay | Admitting: Family Medicine

## 2019-09-16 ENCOUNTER — Ambulatory Visit (INDEPENDENT_AMBULATORY_CARE_PROVIDER_SITE_OTHER): Payer: PPO | Admitting: Family Medicine

## 2019-09-16 ENCOUNTER — Encounter: Payer: Self-pay | Admitting: Family Medicine

## 2019-09-16 ENCOUNTER — Other Ambulatory Visit: Payer: Self-pay

## 2019-09-16 VITALS — BP 126/70 | HR 83 | Resp 16 | Ht <= 58 in | Wt 159.2 lb

## 2019-09-16 DIAGNOSIS — I499 Cardiac arrhythmia, unspecified: Secondary | ICD-10-CM | POA: Diagnosis not present

## 2019-09-16 DIAGNOSIS — F411 Generalized anxiety disorder: Secondary | ICD-10-CM

## 2019-09-16 DIAGNOSIS — I10 Essential (primary) hypertension: Secondary | ICD-10-CM

## 2019-09-16 DIAGNOSIS — F331 Major depressive disorder, recurrent, moderate: Secondary | ICD-10-CM | POA: Insufficient documentation

## 2019-09-16 DIAGNOSIS — K5090 Crohn's disease, unspecified, without complications: Secondary | ICD-10-CM

## 2019-09-16 DIAGNOSIS — E538 Deficiency of other specified B group vitamins: Secondary | ICD-10-CM | POA: Diagnosis not present

## 2019-09-16 DIAGNOSIS — E559 Vitamin D deficiency, unspecified: Secondary | ICD-10-CM | POA: Diagnosis not present

## 2019-09-16 MED ORDER — ALBUTEROL SULFATE HFA 108 (90 BASE) MCG/ACT IN AERS
2.0000 | INHALATION_SPRAY | Freq: Four times a day (QID) | RESPIRATORY_TRACT | 2 refills | Status: DC | PRN
Start: 2019-09-16 — End: 2019-11-04

## 2019-09-16 NOTE — Progress Notes (Signed)
HPI: Kristen Gray is a 74 y.o. female, who is here today for 6 months follow up.   She was last seen on 02/25/19. No new problems since her last visit.  HTN: Negative for severe/frequent headache, visual changes, chest pain, palpitation,focal weakness. She is on Atenolol-Chlorthalidone 50-25 mg daily.  Component     Latest Ref Rng & Units 02/20/2018          Sodium     135 - 146 mmol/L 137  Potassium     3.5 - 5.3 mmol/L 4.3  Chloride     98 - 110 mmol/L 99  CO2     20 - 32 mmol/L 30  Glucose     65 - 99 mg/dL 119 (H)  BUN     7 - 25 mg/dL 21  Creatinine     0.60 - 0.93 mg/dL 1.20  Calcium     8.6 - 10.4 mg/dL 9.8  GFR     >60.00 mL/min 43.85 (L)   Insomnia: She is on Trazodone 100 mg at bedtime. Sleeping 6 hours. Still feeling tired, stable for years.  Anxiety: She is on Effexor 75 mg daily. Tolerating medications well. She lives alone, talks with sister every few days.  Depression screen [facility] 2/9 09/16/2019  Decreased Interest 3  Down, Depressed, Hopeless 1  PHQ - 2 Score 4  Altered sleeping 1  Tired, decreased energy 3  Change in appetite 2  Feeling bad or failure about yourself  2  Trouble concentrating 0  Moving slowly or fidgety/restless 0  Suicidal thoughts 0  PHQ-9 Score 12  Difficult doing work/chores Somewhat difficult   GAD 7 : Generalized Anxiety Score 09/16/2019  Nervous, Anxious, on Edge 1  Control/stop worrying 0  Worry too much - different things 1  Trouble relaxing 0  Restless 0  Easily annoyed or irritable 0  Afraid - awful might happen 0  Total GAD 7 Score 2  Anxiety Difficulty Somewhat difficult   Occasionally she feels short of breath with moderate physical activity. Problem has been going on for a while. Cardiac and pulmonary work up otherwise negative. Cardiac cath 08/2016: Thought SOB was not caused by cardiac etiology. Echo 09/2016: LVEF 55-60% Albuterol inh helps. No wheezing or cough. She does not exercise  regularly due to knee OA, which is getting worse. No falls recently. She uses a cane. Eating small portions.   Crohn's disease: In general problem has been stable. She had an episode of bloating sensation and "explosive diarrhea",07/2019, felt "terrible" x 1 day. No sick contact. She has an appt with GI. Currently she is on Balsalazide 750 mg 2 tabs bid and Sulfasalazine 1000 mg bid.  B12 deficiency: She is on Vit B12 supplementation, 2000 mcg daily.  Vit D deficiency: She is on Vit D 800 U daily.  Review of Systems  Constitutional: Negative for activity change, appetite change and fever.  HENT: Negative for mouth sores, nosebleeds and sore throat.   Gastrointestinal: Negative for abdominal pain, nausea and vomiting.       Negative for changes in bowel habits.  Genitourinary: Negative for decreased urine volume, dysuria and hematuria.  Musculoskeletal: Positive for arthralgias and gait problem.  Allergic/Immunologic: Positive for environmental allergies.  Neurological: Negative for syncope, facial asymmetry and weakness.  Psychiatric/Behavioral: Negative for confusion and hallucinations.  Rest of ROS, see pertinent positives sand negatives in HPI  Current Outpatient Medications on File Prior to Visit  Medication Sig Dispense Refill  . acetaminophen (  TYLENOL) 500 MG tablet Take 1,000 mg by mouth every 6 (six) hours as needed (FOR PAIN.).    Marland Kitchen balsalazide (COLAZAL) 750 MG capsule Take 1,500 mg by mouth 2 (two) times daily.    . Calcium Carbonate-Vitamin D (CALTRATE 600+D PO) Take 2 tablets by mouth daily.    . cycloSPORINE (RESTASIS) 0.05 % ophthalmic emulsion Place 2 drops into both eyes 2 (two) times daily.    . folic acid (FOLVITE) 1 MG tablet TAKE 1 TABLET BY MOUTH EVERY DAY 30 tablet 3  . omeprazole (PRILOSEC) 20 MG capsule Take 1 capsule (20 mg total) by mouth daily before breakfast. 90 capsule 0  . sulfaSALAzine (AZULFIDINE) 500 MG tablet TAKE TWO TABLETS (1000 MG) BY MOUTH  TWICE DAILY    . traZODone (DESYREL) 100 MG tablet TAKE 1 TABLET BY MOUTH AT BEDTIME 90 tablet 1  . valACYclovir (VALTREX) 1000 MG tablet Take 1,000 mg by mouth 3 (three) times daily.  0  . venlafaxine XR (EFFEXOR-XR) 75 MG 24 hr capsule TAKE 1 CAPSULE BY MOUTH EVERY DAY WITH BREAKFAST 90 capsule 1  . vitamin B-12 (CYANOCOBALAMIN) 1000 MCG tablet Take 2,000 mcg by mouth daily.     No current facility-administered medications on file prior to visit.    Past Medical History:  Diagnosis Date  . Anemia   . Anxiety   . Arthritis   . Crohn's disease ([place])   . Depression   . GERD (gastroesophageal reflux disease)   . Heart murmur   . Hypertension   . Reflux    Allergies  Allergen Reactions  . Penicillin G Nausea Only    Has patient had a PCN reaction causing immediate rash, facial/tongue/throat swelling, SOB or lightheadedness with hypotension:No Has patient had a PCN reaction causing severe rash involving mucus membranes or skin necrosis: No Has patient had a PCN reaction that required hospitalization: No Has patient had a PCN reaction occurring within the last 10 years: No--NAUSEA ONLY If all of the above answers are "NO", then may proceed with Cephalosporin use.     Social History   Socioeconomic History  . Marital status: Widowed    Spouse name: Not on file  . Number of children: Not on file  . Years of education: Not on file  . Highest education level: Not on file  Occupational History  . Not on file  Tobacco Use  . Smoking status: Never Smoker  . Smokeless tobacco: Never Used  Vaping Use  . Vaping Use: Never used  Substance and Sexual Activity  . Alcohol use: No  . Drug use: No  . Sexual activity: Not Currently  Other Topics Concern  . Not on file  Social History Narrative  . Not on file   Social Determinants of Health   Financial Resource Strain:   . Difficulty of Paying Living Expenses: Not on file  Food Insecurity:   . Worried About Charity fundraiser  in the Last Year: Not on file  . Ran Out of Food in the Last Year: Not on file  Transportation Needs:   . Lack of Transportation (Medical): Not on file  . Lack of Transportation (Non-Medical): Not on file  Physical Activity:   . Days of Exercise per Week: Not on file  . Minutes of Exercise per Session: Not on file  Stress:   . Feeling of Stress : Not on file  Social Connections:   . Frequency of Communication with Friends and Family: Not on file  .  Frequency of Social Gatherings with Friends and Family: Not on file  . Attends Religious Services: Not on file  . Active Member of Clubs or Organizations: Not on file  . Attends Archivist Meetings: Not on file  . Marital Status: Not on file   Vitals:   09/16/19 1533  BP: 126/70  Pulse: 83  Resp: 16  SpO2: 96%   Wt Readings from Last 3 Encounters:  09/16/19 159 lb 4 oz (72.2 kg)  02/20/18 162 lb (73.5 kg)  07/28/17 158 lb 8 oz (71.9 kg)   Body mass index is 33.28 kg/m.  Physical Exam Vitals and nursing note reviewed.  Constitutional:      General: She is not in acute distress.    Appearance: She is well-developed.  HENT:     Head: Normocephalic and atraumatic.     Mouth/Throat:     Mouth: Mucous membranes are moist.     Pharynx: Oropharynx is clear.  Eyes:     Conjunctiva/sclera: Conjunctivae normal.  Cardiovascular:     Rate and Rhythm: Normal rate and regular rhythm.     Heart sounds: Murmur (SEM I/VI RUSB) heard.      Comments: DP present, bilateral. Pulmonary:     Effort: Pulmonary effort is normal. No respiratory distress.     Breath sounds: Normal breath sounds.  Abdominal:     Palpations: Abdomen is soft.     Tenderness: There is no abdominal tenderness.  Lymphadenopathy:     Cervical: No cervical adenopathy.  Skin:    General: Skin is warm.     Findings: No erythema or rash.  Neurological:     Mental Status: She is alert and oriented to person, place, and time.     Cranial Nerves: No cranial  nerve deficit.     Comments: Antalgic gait assisted with a cane.  Psychiatric:     Comments: Well groomed, good eye contact.   ASSESSMENT AND PLAN:  Ms. Sibyl Mikula was seen today for chronic disease management.  Orders Placed This Encounter  Procedures  . COMPLETE METABOLIC PANEL WITH GFR  . VITAMIN D 25 Hydroxy (Vit-D Deficiency, Fractures)  . Vitamin B12  . TSH  . CBC  . EKG 12-Lead   Lab Results  Component Value Date   TSH 1.77 09/16/2019   Lab Results  Component Value Date   ALT 8 09/16/2019   AST 18 09/16/2019   BILITOT 0.3 09/16/2019   Lab Results  Component Value Date   CREATININE 1.31 (H) 09/16/2019   BUN 25 09/16/2019   NA 138 09/16/2019   K 4.8 09/16/2019   CL 99 09/16/2019   CO2 30 09/16/2019   Lab Results  Component Value Date   WBC 10.1 09/16/2019   HGB 12.7 09/16/2019   HCT 38.3 09/16/2019   MCV 95.8 09/16/2019   PLT 186 09/16/2019    Moderate episode of recurrent major depressive disorder (HCC) Problem adequately controlled. Continue Effexor XR 75 mg daily. Tolerating medication well.  Irregular heart rate Asymptomatic. EKG today :SR,normal axis and intervals, PAC's, no signs of ischemia. Clearly instructed about warning signs.  Vitamin D deficiency Continue vit D 800 U daily. Vit D supplementation will be adjusted according to 25 OH vit D results.  Morbid obesity ([place]) She lost some wt since her last visit, 01/2018 , 3 Lbs. Continue a healthful diet. Physical activity is difficult due to knee and hip OA.  Hypertension, essential BP adequately controlled. No changes in current management.  Continue low salt diet.  Generalized anxiety disorder Stable. No changes in Effexor XR or Trazodone.  Vitamin B 12 deficiency Continue B12 2000 mcg daily. Further recommendations according to B12 results.  Crohn disease ([place]) Stable. Has appt with GI at [facility] next month.  Spent 47 minutes with Ms Beighley, during this time hx was  collected and documented,examination was performed,reviwed some prior imaging and lab results, dicussed Dx's and plan.   Return in about 5 months (around 02/16/2020) for AWV needed..   Othniel Maret G. Martinique, MD  [facility]. [place] office.   A few things to remember from today's visit:  No changes today. EKG today showed a few extra brats, they do not seem worrisome. Monitor for chest discomfort or palpitations.  If you need refills please call your pharmacy. Do not use My Chart to request refills or for acute issues that need immediate attention.    Please be sure medication list is accurate. If a new problem present, please set up appointment sooner than planned today.

## 2019-09-16 NOTE — Patient Instructions (Addendum)
A few things to remember from today's visit:  No changes today. EKG today showed a few extra brats, they do not seem worrisome. Monitor for chest discomfort or palpitations.  If you need refills please call your pharmacy. Do not use My Chart to request refills or for acute issues that need immediate attention.    Please be sure medication list is accurate. If a new problem present, please set up appointment sooner than planned today.

## 2019-09-16 NOTE — Assessment & Plan Note (Signed)
Continue vit D 800 U daily. Vit D supplementation will be adjusted according to 25 OH vit D results.

## 2019-09-16 NOTE — Assessment & Plan Note (Signed)
She lost some wt since her last visit, 01/2018 , 3 Lbs. Continue a healthful diet. Physical activity is difficult due to knee and hip OA.

## 2019-09-16 NOTE — Assessment & Plan Note (Signed)
Continue B12 2000 mcg daily. Further recommendations according to B12 results.

## 2019-09-16 NOTE — Assessment & Plan Note (Signed)
Stable. No changes in Effexor XR or Trazodone.

## 2019-09-16 NOTE — Assessment & Plan Note (Signed)
BP adequately controlled. No changes in current management. Continue low salt diet.

## 2019-09-16 NOTE — Assessment & Plan Note (Signed)
Stable. Has appt with GI at [facility] next month.

## 2019-09-17 LAB — COMPLETE METABOLIC PANEL WITH GFR
AG Ratio: 1.4 (calc) (ref 1.0–2.5)
ALT: 8 U/L (ref 6–29)
AST: 18 U/L (ref 10–35)
Albumin: 3.9 g/dL (ref 3.6–5.1)
Alkaline phosphatase (APISO): 73 U/L (ref 37–153)
BUN/Creatinine Ratio: 19 (calc) (ref 6–22)
BUN: 25 mg/dL (ref 7–25)
CO2: 30 mmol/L (ref 20–32)
Calcium: 9.7 mg/dL (ref 8.6–10.4)
Chloride: 99 mmol/L (ref 98–110)
Creat: 1.31 mg/dL — ABNORMAL HIGH (ref 0.60–0.93)
GFR, Est African American: 46 mL/min/{1.73_m2} — ABNORMAL LOW (ref >=60)
GFR, Est Non African American: 39 mL/min/{1.73_m2} — ABNORMAL LOW (ref >=60)
Globulin: 2.7 g/dL (calc) (ref 1.9–3.7)
Glucose, Bld: 138 mg/dL — ABNORMAL HIGH (ref 65–99)
Potassium: 4.8 mmol/L (ref 3.5–5.3)
Sodium: 138 mmol/L (ref 135–146)
Total Bilirubin: 0.3 mg/dL (ref 0.2–1.2)
Total Protein: 6.6 g/dL (ref 6.1–8.1)

## 2019-09-17 LAB — TSH: TSH: 1.77 mIU/L (ref 0.40–4.50)

## 2019-09-17 LAB — CBC
HCT: 38.3 % (ref 35.0–45.0)
Hemoglobin: 12.7 g/dL (ref 11.7–15.5)
MCH: 31.8 pg (ref 27.0–33.0)
MCHC: 33.2 g/dL (ref 32.0–36.0)
MCV: 95.8 fL (ref 80.0–100.0)
MPV: 12 fL (ref 7.5–12.5)
Platelets: 186 10*3/uL (ref 140–400)
RBC: 4 10*6/uL (ref 3.80–5.10)
RDW: 12.4 % (ref 11.0–15.0)
WBC: 10.1 10*3/uL (ref 3.8–10.8)

## 2019-09-17 LAB — VITAMIN D 25 HYDROXY (VIT D DEFICIENCY, FRACTURES): Vit D, 25-Hydroxy: 29 ng/mL — ABNORMAL LOW (ref 30–100)

## 2019-09-17 LAB — VITAMIN B12: Vitamin B-12: >2000 pg/mL — ABNORMAL HIGH (ref 200–1100)

## 2019-09-19 ENCOUNTER — Telehealth: Payer: Self-pay | Admitting: Family Medicine

## 2019-09-19 MED ORDER — ATENOLOL-CHLORTHALIDONE 50-25 MG PO TABS: 1.0000 | ORAL_TABLET | Freq: Every day | ORAL | 2 refills | Status: DC

## 2019-09-19 NOTE — Telephone Encounter (Signed)
Pt stated she would like a call back about what she was suppose to do about her B12 being low? She forgot what her PCP recommended.   Pt can be reached at [phone]

## 2019-09-20 ENCOUNTER — Other Ambulatory Visit: Payer: Self-pay

## 2019-09-20 MED ORDER — CALCIUM 600 MG PO TABS: 600.0000 mg | ORAL_TABLET | Freq: Two times a day (BID) | ORAL | 3 refills | Status: DC

## 2019-09-20 MED ORDER — VITAMIN D3 SUPER STRENGTH 50 MCG (2000 UT) PO TABS: 1.0000 | ORAL_TABLET | Freq: Every day | ORAL | 3 refills | Status: DC

## 2019-09-20 MED ORDER — VITAMIN B-12 1000 MCG PO TABS: 1000.0000 ug | ORAL_TABLET | Freq: Every day | ORAL | 3 refills | Status: DC

## 2019-09-20 NOTE — Telephone Encounter (Signed)
See result note.  

## 2019-11-04 ENCOUNTER — Telehealth: Payer: Self-pay | Admitting: Family Medicine

## 2019-11-04 MED ORDER — ALBUTEROL SULFATE HFA 108 (90 BASE) MCG/ACT IN AERS
2.0000 | INHALATION_SPRAY | Freq: Four times a day (QID) | RESPIRATORY_TRACT | 2 refills | Status: DC | PRN
Start: 2019-11-04 — End: 2019-11-08

## 2019-11-04 NOTE — Telephone Encounter (Signed)
Pt would like a call back concerning a recommendation for a gastroenterologist. Pt would also like a refill on her albuterol [facility] HFA) [facility]) MCG/ACT inhaler [[id number]] Pharmacy  [facility] - [place], [place] - [address]  [address], [place] [address]  Phone:  [phone] Fax:  [phone]    Please advise at [phone].

## 2019-11-07 ENCOUNTER — Telehealth: Payer: Self-pay | Admitting: Family Medicine

## 2019-11-07 DIAGNOSIS — K50919 Crohn's disease, unspecified, with unspecified complications: Secondary | ICD-10-CM

## 2019-11-07 NOTE — Telephone Encounter (Signed)
  pt requesting sulfa SALAzine (AZULFIDINE) 750  MG tablet - two twice a day  [facility] - [place], [place] - [address]  Phone:  [phone] Fax:  [phone]

## 2019-11-08 ENCOUNTER — Other Ambulatory Visit: Payer: Self-pay | Admitting: Family Medicine

## 2019-11-08 MED ORDER — ALBUTEROL SULFATE HFA 108 (90 BASE) MCG/ACT IN AERS
2.0000 | INHALATION_SPRAY | Freq: Four times a day (QID) | RESPIRATORY_TRACT | 2 refills | Status: DC | PRN
Start: 2019-11-08 — End: 2020-08-01

## 2019-11-08 NOTE — Telephone Encounter (Signed)
Prescription for albuterol sent to her pharmacy. Thanks, BJ

## 2019-11-08 NOTE — Telephone Encounter (Signed)
I spoke with pt. She has a month supply of medication left. Advised I will place her referral to GI. Pt verbalized understanding.

## 2019-11-08 NOTE — Telephone Encounter (Signed)
I believe this Rx is from her GI to treat IBD, hx of crohn's disease. Request can be sent to her GI. Thanks, BJ

## 2019-11-08 NOTE — Telephone Encounter (Signed)
Okay to refill? 

## 2019-11-14 ENCOUNTER — Encounter: Payer: Self-pay | Admitting: Gastroenterology

## 2019-11-18 ENCOUNTER — Other Ambulatory Visit: Payer: Self-pay | Admitting: Family Medicine

## 2019-12-02 ENCOUNTER — Telehealth: Payer: Self-pay | Admitting: Gastroenterology

## 2019-12-02 NOTE — Telephone Encounter (Signed)
Patient advised that she should request refills of sulfasalazine from previous GI ([facility]) as she has not been seen by our practice.  Patient instructed that if they will not give her refills to get her to her new patient apptointment with Dr Ardis Hughs to contact our office and we will work something out.  Patient agreed to plan and verbalized understanding.  No further questions.

## 2019-12-03 ENCOUNTER — Telehealth: Payer: Self-pay | Admitting: Family Medicine

## 2019-12-03 ENCOUNTER — Other Ambulatory Visit: Payer: Self-pay | Admitting: Family Medicine

## 2019-12-03 MED ORDER — BALSALAZIDE DISODIUM 750 MG PO CAPS
1500.0000 mg | ORAL_CAPSULE | Freq: Two times a day (BID) | ORAL | 0 refills | Status: DC
Start: 2019-12-03 — End: 2020-01-10

## 2019-12-03 NOTE — Telephone Encounter (Signed)
See previous encounter

## 2019-12-03 NOTE — Telephone Encounter (Signed)
Patient is calling and requesting a refill for balsalazide (COLAZAL) 750 MG capsule sent to [place], [place] - [address]  [address], [place] [address]  Phone:  [phone] Fax:  [phone] .

## 2019-12-03 NOTE — Telephone Encounter (Signed)
Rx sent. Thanks, BJ

## 2019-12-03 NOTE — Telephone Encounter (Signed)
Pt has an appt with GI on 12/13 as a new patient. Okay to fill until that appt?

## 2019-12-04 ENCOUNTER — Telehealth: Payer: Self-pay | Admitting: Pharmacist

## 2019-12-05 NOTE — Chronic Care Management (AMB) (Signed)
Chronic Care Management Pharmacy Assistant   Name: Kristen Gray  MRN: [id number] DOB: November 14, 1945  Reason for Encounter: Medication Review/ General Adherence call  Patient Questions:  1.  Have you seen any other providers since your last visit? No  2.  Any changes in your medicines or health? No   PCP : Martinique, Betty G, MD  Allergies:   Allergies  Allergen Reactions  . Penicillin G Nausea Only    Has patient had a PCN reaction causing immediate rash, facial/tongue/throat swelling, SOB or lightheadedness with hypotension:No Has patient had a PCN reaction causing severe rash involving mucus membranes or skin necrosis: No Has patient had a PCN reaction that required hospitalization: No Has patient had a PCN reaction occurring within the last 10 years: No--NAUSEA ONLY If all of the above answers are "NO", then may proceed with Cephalosporin use.     Medications: Outpatient Encounter Medications as of 12/04/2019  Medication Sig  . acetaminophen (TYLENOL) 500 MG tablet Take 1,000 mg by mouth every 6 (six) hours as needed (FOR PAIN.).  Marland Kitchen albuterol (PROAIR HFA) 108 (90 Base) MCG/ACT inhaler Inhale 2 puffs into the lungs every 6 (six) hours as needed for wheezing or shortness of breath.  Marland Kitchen atenolol-chlorthalidone (TENORETIC) 50-25 MG tablet Take 1 tablet by mouth daily.  . balsalazide (COLAZAL) 750 MG capsule Take 2 capsules (1,500 mg total) by mouth 2 (two) times daily.  . calcium carbonate (OS-CAL) 600 MG tablet Take 1 tablet (600 mg total) by mouth 2 (two) times daily with a meal.  . Calcium Carbonate-Vitamin D (CALTRATE 600+D PO) Take 2 tablets by mouth daily.  . Cholecalciferol (VITAMIN D3 SUPER STRENGTH) 50 MCG (2000 UT) TABS Take 1 tablet (2,000 Units total) by mouth daily.  . cycloSPORINE (RESTASIS) 0.05 % ophthalmic emulsion Place 2 drops into both eyes 2 (two) times daily.  . folic acid (FOLVITE) 1 MG tablet TAKE 1 TABLET BY MOUTH EVERY DAY  . omeprazole (PRILOSEC) 20 MG  capsule Take 1 capsule (20 mg total) by mouth daily before breakfast.  . sulfaSALAzine (AZULFIDINE) 500 MG tablet TAKE TWO TABLETS (1000 MG) BY MOUTH TWICE DAILY  . traZODone (DESYREL) 100 MG tablet TAKE 1 TABLET BY MOUTH NIGHTLY AT BEDTIME  . valACYclovir (VALTREX) 1000 MG tablet Take 1,000 mg by mouth 3 (three) times daily.  Marland Kitchen venlafaxine XR (EFFEXOR-XR) 75 MG 24 hr capsule TAKE 1 CAPSULE BY MOUTH EVERY DAY WITH BREAKFAST  . vitamin B-12 (CYANOCOBALAMIN) 1000 MCG tablet Take 1 tablet (1,000 mcg total) by mouth daily.   No facility-administered encounter medications on file as of 12/04/2019.    Current Diagnosis: Patient Active Problem List   Diagnosis Date Noted  . Moderate episode of recurrent major depressive disorder ([place]) 09/16/2019  . Crohn disease ([place]) 02/25/2019  . Chronic fatigue 02/25/2019  . Vitamin B 12 deficiency 02/25/2019  . Unilateral primary osteoarthritis, left knee 03/27/2018  . Hypokalemia 02/20/2018  . GERD (gastroesophageal reflux disease) 07/28/2017  . Unilateral primary osteoarthritis, right knee 01/31/2017  . Sebaceous cyst 10/21/2016  . Anemia 10/20/2016  . Primary osteoarthritis of right hip 09/12/2016  . Pulmonary hypertension ([place]) 07/26/2016  . OSA (obstructive sleep apnea) 07/26/2016  . Morbid obesity ([place]) 04/14/2016  . Hypertension, essential 04/13/2016  . Vitamin D deficiency 04/13/2016  . Insomnia 04/13/2016  . Generalized anxiety disorder 04/13/2016  . 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 Osteoarthritis, 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/13/2016  . Dyspnea 10/07/2015    Goals Addressed   None  Follow-Up:  Pharmacist Review   I called the patient and discussed medication adherence with the patient, no issues at this time with current medication. She states she still has some issues with her right knee but she understands the problems. She has an appointment next with Gastroenterology.   She denies ED visits since his last CPP follow-up and any side effects  with his medication. She denies any problems with his current pharmacy. Her next CCM appointment is set for February.   Maia Breslow, [place] Assistant [phone]

## 2019-12-12 ENCOUNTER — Other Ambulatory Visit: Payer: Self-pay | Admitting: Family Medicine

## 2019-12-13 ENCOUNTER — Other Ambulatory Visit: Payer: Self-pay | Admitting: Family Medicine

## 2019-12-16 ENCOUNTER — Telehealth: Payer: Self-pay | Admitting: Family Medicine

## 2019-12-16 NOTE — Telephone Encounter (Signed)
Okay for pt to increase to an extra capsule due to additional anxiety with the holidays?

## 2019-12-16 NOTE — Telephone Encounter (Signed)
Pt is calling in wanting to know if she can take a extra capsule of Rx venlafaxine XR (EFFEXOR-XR) 75 MG due to her having anxiety act.  Pharm:  [facility] [address] would really appreciate it and would like to have a call once it has been approved.

## 2019-12-17 NOTE — Telephone Encounter (Signed)
The only problem is that she is also on Trazodone, she can double Effexor if she decreases dose of trazodone from 100 mg to 50. Thre is risk of serotonin synd with these 2 meds. F/u in 6-8 weeks if she changes meds. Thanks, BJ

## 2019-12-17 NOTE — Telephone Encounter (Signed)
I spoke with patient. She is aware of message below & f/u appt scheduled for 12/28 at 3:30pm. Pt will call next week to let us know how she is doing on the Effexor taking 2 a day. Pt is aware to decrease the trazadone to 43m.

## 2020-01-06 ENCOUNTER — Ambulatory Visit: Payer: PPO | Admitting: Gastroenterology

## 2020-01-09 ENCOUNTER — Telehealth: Payer: Self-pay | Admitting: Family Medicine

## 2020-01-09 NOTE — Telephone Encounter (Signed)
Patient is calling and wanted to see if provider can refill balsalazide (COLAZAL) 750 MG capsule sent to [facility], [place] -  [address], [place] [address]  Phone:  [phone] Fax:  [phone]  CB is [phone]

## 2020-01-10 ENCOUNTER — Ambulatory Visit: Payer: PPO | Admitting: Physician Assistant

## 2020-01-10 ENCOUNTER — Telehealth: Payer: Self-pay | Admitting: Family Medicine

## 2020-01-10 MED ORDER — BALSALAZIDE DISODIUM 750 MG PO CAPS
1500.0000 mg | ORAL_CAPSULE | Freq: Two times a day (BID) | ORAL | 0 refills | Status: DC
Start: 2020-01-10 — End: 2020-01-30

## 2020-01-10 MED ORDER — BALSALAZIDE DISODIUM 750 MG PO CAPS
1500.0000 mg | ORAL_CAPSULE | Freq: Two times a day (BID) | ORAL | 0 refills | Status: DC
Start: 2020-01-10 — End: 2020-01-10

## 2020-01-10 NOTE — Telephone Encounter (Signed)
Rx sent in, pt is aware.

## 2020-01-10 NOTE — Telephone Encounter (Signed)
Rx's sent in. °

## 2020-01-10 NOTE — Telephone Encounter (Signed)
Pt is calling in stating that she needs to have a little more Rx balsalazide (COLAZAL) 750 MG pt is needing enough until her appointment on 01/30/2020   Pharm:  [facility] on [address] [place].  Pt state that she appreciate all that Dr. Martinique has done and would like to have a call back when it is called in.

## 2020-01-15 ENCOUNTER — Other Ambulatory Visit: Payer: Self-pay | Admitting: Family Medicine

## 2020-01-20 ENCOUNTER — Other Ambulatory Visit: Payer: Self-pay | Admitting: Family Medicine

## 2020-01-21 ENCOUNTER — Telehealth (INDEPENDENT_AMBULATORY_CARE_PROVIDER_SITE_OTHER): Payer: PPO | Admitting: Family Medicine

## 2020-01-21 ENCOUNTER — Encounter: Payer: Self-pay | Admitting: Family Medicine

## 2020-01-21 VITALS — Ht <= 58 in

## 2020-01-21 DIAGNOSIS — Y92009 Unspecified place in unspecified non-institutional (private) residence as the place of occurrence of the external cause: Secondary | ICD-10-CM | POA: Diagnosis not present

## 2020-01-21 DIAGNOSIS — Z1159 Encounter for screening for other viral diseases: Secondary | ICD-10-CM | POA: Diagnosis not present

## 2020-01-21 DIAGNOSIS — G4700 Insomnia, unspecified: Secondary | ICD-10-CM | POA: Diagnosis not present

## 2020-01-21 DIAGNOSIS — F331 Major depressive disorder, recurrent, moderate: Secondary | ICD-10-CM | POA: Diagnosis not present

## 2020-01-21 DIAGNOSIS — W19XXXA Unspecified fall, initial encounter: Secondary | ICD-10-CM | POA: Diagnosis not present

## 2020-01-21 DIAGNOSIS — K5090 Crohn's disease, unspecified, without complications: Secondary | ICD-10-CM

## 2020-01-21 DIAGNOSIS — I10 Essential (primary) hypertension: Secondary | ICD-10-CM | POA: Diagnosis not present

## 2020-01-21 DIAGNOSIS — M159 Polyosteoarthritis, unspecified: Secondary | ICD-10-CM

## 2020-01-21 NOTE — Progress Notes (Signed)
Virtual Visit via Telephone Note  I connected with Corrinna Karapetyan Bushard on 01/21/20 at  3:30 PM EST by telephone and verified that I am speaking with the correct person using two identifiers.   I discussed the limitations, risks, security and privacy concerns of performing an evaluation and management service by telephone and the availability of in person appointments. I also discussed with the patient that there may be a patient responsible charge related to this service. The patient expressed understanding and agreed to proceed.  Location patient: home Location provider: work office Participants present for the call: patient, provider Patient did not have a visit in the prior 7 days to address this/these issue(s).  Chief Complaint  Patient presents with  . Follow-up    History of Present Illness: Ms Modisette is a very pleasant 74 yo female with hx of HTN,depression,insomnia,and generalized OA following on some chronic problems. Last follow up on 09/16/19.  HTN: She is on Atenolol-Chlorthalidone 50-25 mg daily. Negative for severe/frequent headache, visual changes, chest pain, dyspnea, palpitation, focal weakness, or edema. She is not checking BP regularly.  Lab Results  Component Value Date   CREATININE 1.31 (H) 09/16/2019   BUN 25 09/16/2019   NA 138 09/16/2019   K 4.8 09/16/2019   CL 99 09/16/2019   CO2 30 09/16/2019   Knee and hip pain. Getting worse. Exacerbated by certain activities and alleviated by rest. Limiting some of her daily activities. Follows with ortho.  Her house has handicap features. She usually orders groceries on line and have them delivered to [facility]. If needed she drives locally.  [place] a few days ago in her house,had a hard time trying to get up. She was trying to reach something while she was in bed and fell. Landed seated. No serious injury.  Crohn's disease:She is on Balsalazide 750 mg 2 caps bid, pending appt to established with new GI. No new  GI symptoms. Negative for abdominal pain,nausea,or vomiting.  Depression and insomnia: She is on Effexor XR 75 mg daily and Trazodone 100 mg at bedtime. Sleeps about 7 hours, wakes up through the night. She does not take naps. She feels like she is doing well in general, dealing well with limitations due to OA. Lives alone, has no family in town. Holidays seasons are hard because love ones have died around Thanksgiving (father), christmas (mother),and easter (husband).This year was "a little harder", not sure why but she feels better now.   She keeps in touch with her sister, who lives in [place].   Observations/Objective: Patient sounds cheerful and well on the phone. I do not appreciate any SOB. Speech and thought processing are grossly intact. Patient reported vitals:Ht 4' 10"  (1.473 m)   BMI 33.28 kg/m   Assessment and Plan: Orders Placed This Encounter  Procedures  . Basic metabolic panel  . Hepatitis C antibody    1. Insomnia, unspecified type Good sleep hygiene. Stable. Continue Trazodone 100 mg at bedtime. Risk of interaction with Effexor, has tolerated meds well.  2. Osteoarthritis, generalized Fall precautions. Tylenol 500 mg 3-4 times per day if needed. Following with ortho.  3. Hypertension, essential Problem has been adequately controlled. Continue Atenolol-Chlorthalidone 50-25 mg daily. Low salt diet.  4. Crohn's disease without complication, unspecified gastrointestinal tract location [facility]) Well controlled. No changes in current management. She has appt to establish with new GI provider.  5. Moderate episode of recurrent major depressive disorder (HCC) Stable. Continue Effexor XR 75 mg daily.  6. Fall in  home, initial encounter Fall precautions discussed. Risk factors: OA and some medications.  7. Encounter for HCV screening test for low risk patient - Hepatitis C antibody; Future   Follow Up Instructions:  Return in about 5 months  (around 06/20/2020) for HTN,anxiety,mdepression. Needs lab appt.  I did not refer this patient for an OV in the next 24 hours for this/these issue(s).  I discussed the assessment and treatment plan with the patient. Ms. Banning was provided an opportunity to ask questions and all were answered. She agreed with the plan and demonstrated an understanding of the instructions.   I provided 35 minutes of non-face-to-face time during this encounter.   Nyari Olsson Martin[place], MD

## 2020-01-22 NOTE — Progress Notes (Signed)
Patient is scheduled for labs on 01/28/2020 at 3 PM

## 2020-01-24 ENCOUNTER — Encounter: Payer: Self-pay | Admitting: Family Medicine

## 2020-01-28 ENCOUNTER — Other Ambulatory Visit: Payer: PPO

## 2020-01-30 ENCOUNTER — Encounter: Payer: Self-pay | Admitting: Gastroenterology

## 2020-01-30 ENCOUNTER — Ambulatory Visit (INDEPENDENT_AMBULATORY_CARE_PROVIDER_SITE_OTHER): Payer: PPO | Admitting: Gastroenterology

## 2020-01-30 VITALS — BP 110/74 | HR 115 | Ht <= 58 in | Wt 157.6 lb

## 2020-01-30 DIAGNOSIS — K5090 Crohn's disease, unspecified, without complications: Secondary | ICD-10-CM

## 2020-01-30 MED ORDER — BALSALAZIDE DISODIUM 750 MG PO CAPS: 1500.0000 mg | ORAL_CAPSULE | Freq: Two times a day (BID) | ORAL | 11 refills | Status: DC

## 2020-01-30 NOTE — Patient Instructions (Signed)
If you are age 77 or older, your body mass index should be between 23-30. Your Body mass index is 32.94 kg/m. If this is out of the aforementioned range listed, please consider follow up with your Primary Care Provider.  If you are age 39 or younger, your body mass index should be between 19-25. Your Body mass index is 32.94 kg/m. If this is out of the aformentioned range listed, please consider follow up with your Primary Care Provider.   We have sent the following medications to your pharmacy for you to pick up at your convenience:Balsalazide 745m   Thank you for choosing [facility] and [facility].  JAlonza Bogus PA-C

## 2020-01-30 NOTE — Progress Notes (Signed)
01/30/2020 Kristen Gray [id number] 09/29/1945   HISTORY OF PRESENT ILLNESS: This is a 75 year old female who is new to our office.  She was previously a patient of Dr. Oletta Lamas for many years, but now he retired and she is transferring her care here.  We do not have any of her records unfortunately.  She has longstanding history of Crohn's disease that she tells me was diagnosed in 46.  She says that she was not treated with any medications for a long time and then was placed on sulfasalazine and folic acid in [age].  She was on that for several years up until just a couple of years ago when it was switched to balsalazide.  She is now maintained on 1500 mg about twice daily.  She is currently out of her medication and is asking for new prescriptions.  Overall she says that from a GI standpoint she feels well.  She says that she had some constipation today and had a little bit of straining says she saw some bright red blood this morning, but this is not a regular occurrence.  She denies abdominal pain.  She denies diarrhea.  She usually has regular bowel movements with occasional constipation.  She has been her last colonoscopy was probably 10 or so years ago.    She does complain of being very fatigued and easily winded with minimal activity.  Her last several visits with her PCP have been virtual visits.  She says that she does not always bring everything up to her PCP when she has a visit.   Past Medical History:  Diagnosis Date  . Anemia   . Anxiety   . Arthritis   . Crohn's disease ([place])   . Depression   . GERD (gastroesophageal reflux disease)   . Heart murmur   . Hypertension   . Reflux    Past Surgical History:  Procedure Laterality Date  . ANKLE SURGERY    . CARPAL TUNNEL RELEASE    . CHOLECYSTECTOMY    . COLON SURGERY    . RIGHT/LEFT HEART CATH AND CORONARY ANGIOGRAPHY N/A 09/15/2016   Procedure: RIGHT/LEFT HEART CATH AND CORONARY ANGIOGRAPHY;  Surgeon: Larey Dresser,  MD;  Location: [facility];  Service: Cardiovascular;  Laterality: N/A;    reports that she has never smoked. She has never used smokeless tobacco. She reports that she does not drink alcohol and does not use drugs. family history includes Diabetes in her father; Heart disease in her mother; Hyperlipidemia in her father; Hypertension in her father; Stroke in her father. Allergies  Allergen Reactions  . Penicillin G Nausea Only    Has patient had a PCN reaction causing immediate rash, facial/tongue/throat swelling, SOB or lightheadedness with hypotension:No Has patient had a PCN reaction causing severe rash involving mucus membranes or skin necrosis: No Has patient had a PCN reaction that required hospitalization: No Has patient had a PCN reaction occurring within the last 10 years: No--NAUSEA ONLY If all of the above answers are "NO", then may proceed with Cephalosporin use.       Outpatient Encounter Medications as of 01/30/2020  Medication Sig  . acetaminophen (TYLENOL) 500 MG tablet Take 1,000 mg by mouth every 6 (six) hours as needed (FOR PAIN.).  Marland Kitchen albuterol (PROAIR HFA) 108 (90 Base) MCG/ACT inhaler Inhale 2 puffs into the lungs every 6 (six) hours as needed for wheezing or shortness of breath.  Marland Kitchen atenolol-chlorthalidone (TENORETIC) 50-25 MG tablet TAKE 1  TABLET BY MOUTH EVERY DAY  . balsalazide (COLAZAL) 750 MG capsule Take 2 capsules (1,500 mg total) by mouth 2 (two) times daily.  . Calcium Carbonate-Vitamin D (CALTRATE 600+D PO) Take 2 tablets by mouth daily.  . Cholecalciferol (VITAMIN D3) 50 MCG (2000 UT) capsule TAKE 1 CAPSULE BY MOUTH EVERY DAY  . folic acid (FOLVITE) 1 MG tablet TAKE 1 TABLET BY MOUTH EVERY DAY  . omeprazole (PRILOSEC) 20 MG capsule Take 1 capsule (20 mg total) by mouth daily before breakfast.  . traZODone (DESYREL) 100 MG tablet TAKE 1 TABLET BY MOUTH NIGHTLY AT BEDTIME  . venlafaxine XR (EFFEXOR-XR) 75 MG 24 hr capsule TAKE 1 CAPSULE BY MOUTH EVERY DAY  WITH BREAKFAST  . vitamin B-12 (CYANOCOBALAMIN) 1000 MCG tablet Take 1 tablet (1,000 mcg total) by mouth daily.  . [DISCONTINUED] calcium carbonate (OS-CAL) 600 MG tablet Take 1 tablet by mouth two times daily with a meal. (Patient not taking: Reported on 01/30/2020)  . [DISCONTINUED] cycloSPORINE (RESTASIS) 0.05 % ophthalmic emulsion Place 2 drops into both eyes 2 (two) times daily. (Patient not taking: Reported on 01/30/2020)  . [DISCONTINUED] sulfaSALAzine (AZULFIDINE) 500 MG tablet TAKE TWO TABLETS (1000 MG) BY MOUTH TWICE DAILY (Patient not taking: Reported on 01/30/2020)  . [DISCONTINUED] valACYclovir (VALTREX) 1000 MG tablet Take 1,000 mg by mouth 3 (three) times daily. (Patient not taking: Reported on 01/30/2020)   No facility-administered encounter medications on file as of 01/30/2020.     REVIEW OF SYSTEMS  : All other systems reviewed and negative except where noted in the History of Present Illness.   PHYSICAL EXAM: BP 100/60 (BP Location: Right Arm, Patient Position: Sitting, Cuff Size: Normal)   Pulse (!) 152   Ht 4' 10"  (1.473 m)   Wt 157 lb 9.6 oz (71.5 kg)   SpO2 99%   BMI 32.94 kg/m  General: Well developed white female in no acute distress Head: Normocephalic and atraumatic Eyes:  Sclerae anicteric, conjunctiva pink. Ears: Normal auditory acuity Lungs: Clear throughout to auscultation; no W/R/R. Heart: Tachy and irregular; no M/R/G. Abdomen: Soft, non-distended.  BS present.  Non-tender. Musculoskeletal: Symmetrical with no gross deformities  Skin: No lesions on visible extremities Extremities: No edema  Neurological: Alert oriented x 4, grossly non-focal Psychological:  Alert and cooperative. Normal mood and affect  ASSESSMENT AND PLAN: *75 year old female with longstanding Crohn's disease that she tells me was diagnosed in 35.  She tells me that she was not treated with any specific medications for several years, but then in 1984 was placed on sulfasalazine and folic  acid for many years until maybe a couple of years ago when it was switched to balsalazide.  She is now maintained on balsalazide 1500 mg twice daily.  She denies any GI complaints.  She saw small amount of bright red blood in her stool this morning, but was straining with a little constipation today.  Her last colonoscopy was several years ago.  We do not have any of her previous records.  She is out of her medication.  I will new her prescription for now.  We will request records from [place].  She says that she is having labs performed for her PCP next week.  **Of note, she was tachycardic today and her heartbeat sounded irregular.  She complains of being very fatigued and short of breath.  I advised her to contact her PCP immediately to discuss these issues.  Clinically she looked fine in the office today.  CC:  Martinique, Betty G, MD

## 2020-01-31 ENCOUNTER — Encounter: Payer: Self-pay | Admitting: Gastroenterology

## 2020-01-31 NOTE — Progress Notes (Signed)
Reviewed.  Toya Palacios L. Madox Corkins, MD, MPH  

## 2020-02-04 ENCOUNTER — Telehealth: Payer: Self-pay | Admitting: Family Medicine

## 2020-02-04 ENCOUNTER — Other Ambulatory Visit: Payer: PPO

## 2020-02-04 DIAGNOSIS — F331 Major depressive disorder, recurrent, moderate: Secondary | ICD-10-CM

## 2020-02-04 DIAGNOSIS — F411 Generalized anxiety disorder: Secondary | ICD-10-CM

## 2020-02-04 MED ORDER — VENLAFAXINE HCL ER 75 MG PO CP24: 150.0000 mg | ORAL_CAPSULE | Freq: Every day | ORAL | 3 refills | Status: DC

## 2020-02-04 MED ORDER — TRAZODONE HCL 100 MG PO TABS: ORAL_TABLET | ORAL | 1 refills | Status: DC

## 2020-02-04 NOTE — Addendum Note (Signed)
Addended by: Rodrigo Ran on: 02/04/2020 10:49 AM   Modules accepted: Orders

## 2020-02-04 NOTE — Telephone Encounter (Signed)
[facility] is calling and stated that patient is running of medication Venlafaxine because she has to take two a day and they needed a new prescription with the quantity change, please advise. CB is [phone]

## 2020-02-04 NOTE — Telephone Encounter (Signed)
Updated pt's Rx's and sent a new Rx for Effexor to the pharmacy.

## 2020-02-11 ENCOUNTER — Other Ambulatory Visit: Payer: PPO

## 2020-03-03 ENCOUNTER — Telehealth (INDEPENDENT_AMBULATORY_CARE_PROVIDER_SITE_OTHER): Payer: PPO | Admitting: Family Medicine

## 2020-03-03 DIAGNOSIS — K219 Gastro-esophageal reflux disease without esophagitis: Secondary | ICD-10-CM | POA: Diagnosis not present

## 2020-03-03 DIAGNOSIS — F419 Anxiety disorder, unspecified: Secondary | ICD-10-CM

## 2020-03-03 DIAGNOSIS — R0609 Other forms of dyspnea: Secondary | ICD-10-CM

## 2020-03-03 DIAGNOSIS — F439 Reaction to severe stress, unspecified: Secondary | ICD-10-CM | POA: Diagnosis not present

## 2020-03-03 DIAGNOSIS — R0600 Dyspnea, unspecified: Secondary | ICD-10-CM

## 2020-03-03 DIAGNOSIS — K30 Functional dyspepsia: Secondary | ICD-10-CM

## 2020-03-03 NOTE — Patient Instructions (Addendum)
-schedule appointment with Dr. Martinique or go to urgent care in the next 2-3 days as we discussed for evaluation of the difficulty breathing when you are walking.   -call to schedule counseling for anxiety and stress with [facility] [phone]  -the dose of the Effexor is 154m  -see the dietary measures below for the stomach, also contact your gastroenterologist to see if it would be ok to take the prilosec twice daily for 1 week.    Food Choices for Gastroesophageal Reflux Disease, Adult When you have gastroesophageal reflux disease (GERD), the foods you eat and your eating habits are very important. Choosing the right foods can help ease your discomfort. Think about working with a food expert (dietitian) to help you make good choices. What are tips for following this plan? Reading food labels  Look for foods that are low in saturated fat. Foods that may help with your symptoms include: ? Foods that have less than 5% of daily value (DV) of fat. ? Foods that have 0 grams of trans fat. Cooking  Do not fry your food.  Cook your food by baking, steaming, grilling, or broiling. These are all methods that do not need a lot of fat for cooking.  To add flavor, try to use herbs that are low in spice and acidity. Meal planning  Choose healthy foods that are low in fat, such as: ? Fruits and vegetables. ? Whole grains. ? Low-fat dairy products. ? Lean meats, fish, and poultry.  Eat small meals often instead of eating 3 large meals each day. Eat your meals slowly in a place where you are relaxed. Avoid bending over or lying down until 2-3 hours after eating.  Limit high-fat foods such as fatty meats or fried foods.  Limit your intake of fatty foods, such as oils, butter, and shortening.  Avoid the following as told by your doctor: ? Foods that cause symptoms. These may be different for different people. Keep a food diary to keep track of foods that cause  symptoms. ? Alcohol. ? Drinking a lot of liquid with meals. ? Eating meals during the 2-3 hours before bed.   Lifestyle  Stay at a healthy weight. Ask your doctor what weight is healthy for you. If you need to lose weight, work with your doctor to do so safely.  Exercise for at least 30 minutes on 5 or more days each week, or as told by your doctor.  Wear loose-fitting clothes.  Do not smoke or use any products that contain nicotine or tobacco. If you need help quitting, ask your doctor.  Sleep with the head of your bed higher than your feet. Use a wedge under the mattress or blocks under the bed frame to raise the head of the bed.  Chew sugar-free gum after meals. What foods should eat? Eat a healthy, well-balanced diet of fruits, vegetables, whole grains, low-fat dairy products, lean meats, fish, and poultry. Each person is different. Foods that may cause symptoms in one person may not cause any symptoms in another person. Work with your doctor to find foods that are safe for you. The items listed above may not be a complete list of what you can eat and drink. Contact a food expert for more options.   What foods should I avoid? Limiting some of these foods may help in managing the symptoms of GERD. Everyone is different. Talk with a food expert or your doctor to help you find the exact foods to  avoid, if any. Fruits Any fruits prepared with added fat. Any fruits that cause symptoms. For some people, this may include citrus fruits, such as oranges, grapefruit, pineapple, and lemons. Vegetables Deep-fried vegetables. [place] fries. Any vegetables prepared with added fat. Any vegetables that cause symptoms. For some people, this may include tomatoes and tomato products, chili peppers, onions and garlic, and horseradish. Grains Pastries or quick breads with added fat. Meats and other proteins High-fat meats, such as fatty beef or pork, hot dogs, ribs, ham, sausage, salami, and bacon. Fried  meat or protein, including fried fish and fried chicken. Nuts and nut butters, in large amounts. Dairy Whole milk and chocolate milk. Sour cream. Cream. Ice cream. Cream cheese. Milkshakes. Fats and oils Butter. Margarine. Shortening. Ghee. Beverages Coffee and tea, with or without caffeine. Carbonated beverages. Sodas. Energy drinks. Fruit juice made with acidic fruits, such as orange or grapefruit. Tomato juice. Alcoholic drinks. Sweets and desserts Chocolate and cocoa. Donuts. Seasonings and condiments Pepper. Peppermint and spearmint. Added salt. Any condiments, herbs, or seasonings that cause symptoms. For some people, this may include curry, hot sauce, or vinegar-based salad dressings. The items listed above may not be a complete list of what you should not eat and drink. Contact a food expert for more options. Questions to ask your doctor Diet and lifestyle changes are often the first steps that are taken to manage symptoms of GERD. If diet and lifestyle changes do not help, talk with your doctor about taking medicines. Where to find more information  International Foundation for Gastrointestinal Disorders: aboutgerd.org Summary  When you have GERD, food and lifestyle choices are very important in easing your symptoms.  Eat small meals often instead of 3 large meals a day. Eat your meals slowly and in a place where you are relaxed.  Avoid bending over or lying down until 2-3 hours after eating.  Limit high-fat foods such as fatty meats or fried foods. This information is not intended to replace advice given to you by your health care provider. Make sure you discuss any questions you have with your health care provider. Document Revised: 07/22/2019 Document Reviewed: 07/22/2019 Elsevier Patient Education  [place].

## 2020-03-03 NOTE — Progress Notes (Signed)
Virtual Visit via Telephone Note  I connected with Kristen Gray on 03/03/20 at 12:00 PM EST by telephone and verified that I am speaking with the correct person using two identifiers.   I discussed the limitations, risks, security and privacy concerns of performing an evaluation and management service by telephone and the availability of in person appointments. I also discussed with the patient that there may be a patient responsible charge related to this service. The patient expressed understanding and agreed to proceed.  Location patient: home, [place] Location provider: work or home office Participants present for the call: patient, provider Patient did not have a visit with me in the prior 7 days to address this/these issue(s).   History of Present Illness:  Acute telemedicine visit for nausea: -Onset: a few days ago -Symptoms include: nausea - upset stomach, worse when she lies down, sometimes a little reflux into her mouth -Denies: fevers, malaise, melena, hematochezia, abd pain, CP, palpitations, vomiting, diarrhea, SI -reports for for about 1.5 months has also had this sensation of not being able to breath well when active or walks even short distances -reports has been having "panic attacks" which she reports she has never had - has a had a lot of stress because of a water leak in her house and plumber is taking forever to repair it -reports has bad knees as well -reports everything on television upsets her as well -she feels the effexor dose is too low - has been taking 47m (1 tab) daily, says to take 2 capsules on instructions from PCP    Observations/Objective: Patient sounds cheerful and well on the phone. I do not appreciate any SOB. Speech and thought processing are grossly intact. Patient reported vitals:  Assessment and Plan:  Stress  Anxiety  Upset stomach  Gastroesophageal reflux disease, unspecified whether esophagitis present  Dyspnea on exertion  -Ms.  Remmers is quite pleasant and has a lot going on today.  We discussed possible serious and likely etiologies, options for evaluation and workup, limitations of telemedicine visit vs in person visit, treatment, treatment risks and precautions. Pt prefers to treat via telemedicine empirically rather than in person at this moment.  -For the stress and anxiety, we found out that she is taking the wrong dose of her Effexor, went over the dosing recommended by her primary care doctor in detail to make sure she understood.  Also advised CBT and provided her with the number to contact with our behavioral health.  I think this could be quite useful for her.  Advised close follow-up with her primary care doctor regarding this as well. -For the upset stomach, this really could be related to the stress.  Also suggested dietary messages for acid reflux and she could consider upping her Prilosec dose to twice daily for 1 week.  Did advise that she consult with her gastroenterologist regarding this. -She needs an in person evaluation at a higher level of care for the dyspnea on exertion that she has been experiencing for over a month.  Discussed options for care in case she cannot get a visit with her PCP office.  Advised that she get this checked out promptly either today or within the next several days, ASAP. Scheduled follow up with PCP offered: Sent message to schedulers to assist and advised patient to contact PCP office to schedule if does not receive call back in next 24 hours. Advised to seek prompt in person care if worsening, new symptoms arise, or if is  not improving with treatment. Advised of options for inperson care in case PCP office not available. Did let the patient know that I only do telemedicine shifts for [place] on Tuesdays and Thursdays and advised a follow up visit with PCP or at an [facility] if has further questions or concerns.   Follow Up Instructions:  I did not refer this patient for an OV with me  in the next 24 hours for this/these issue(s).  I discussed the assessment and treatment plan with the patient. The patient was provided an opportunity to ask questions and all were answered. The patient agreed with the plan and demonstrated an understanding of the instructions.   I spent 25 minutes on the date of this visit in the care of this patient. See summary of tasks completed to properly care for this patient in the detailed notes above which also included counseling of above, review of PMH, medications, allergies, evaluation of the patient and ordering and/or  instructing patient on testing and care options.     Kristen Kern, DO

## 2020-03-04 ENCOUNTER — Emergency Department (HOSPITAL_COMMUNITY): Payer: PPO

## 2020-03-04 ENCOUNTER — Inpatient Hospital Stay (HOSPITAL_COMMUNITY)
Admission: EM | Admit: 2020-03-04 | Discharge: 2020-03-09 | DRG: 308 | Disposition: A | Discharge status: Home / Self Care | Payer: PPO | Attending: Cardiology | Admitting: Cardiology

## 2020-03-04 ENCOUNTER — Ambulatory Visit: Payer: PPO | Admitting: Family Medicine

## 2020-03-04 ENCOUNTER — Other Ambulatory Visit: Payer: Self-pay

## 2020-03-04 ENCOUNTER — Encounter (HOSPITAL_COMMUNITY): Payer: Self-pay

## 2020-03-04 DIAGNOSIS — I130 Hypertensive heart and chronic kidney disease with heart failure and stage 1 through stage 4 chronic kidney disease, or unspecified chronic kidney disease: Secondary | ICD-10-CM | POA: Diagnosis present

## 2020-03-04 DIAGNOSIS — Z79899 Other long term (current) drug therapy: Secondary | ICD-10-CM | POA: Diagnosis not present

## 2020-03-04 DIAGNOSIS — K219 Gastro-esophageal reflux disease without esophagitis: Secondary | ICD-10-CM | POA: Diagnosis not present

## 2020-03-04 DIAGNOSIS — I4819 Other persistent atrial fibrillation: Principal | ICD-10-CM | POA: Diagnosis present

## 2020-03-04 DIAGNOSIS — Z79891 Long term (current) use of opiate analgesic: Secondary | ICD-10-CM | POA: Diagnosis not present

## 2020-03-04 DIAGNOSIS — F32A Depression, unspecified: Secondary | ICD-10-CM | POA: Diagnosis present

## 2020-03-04 DIAGNOSIS — R457 State of emotional shock and stress, unspecified: Secondary | ICD-10-CM | POA: Diagnosis not present

## 2020-03-04 DIAGNOSIS — I4891 Unspecified atrial fibrillation: Secondary | ICD-10-CM | POA: Diagnosis present

## 2020-03-04 DIAGNOSIS — R069 Unspecified abnormalities of breathing: Secondary | ICD-10-CM | POA: Diagnosis not present

## 2020-03-04 DIAGNOSIS — N183 Chronic kidney disease, stage 3 unspecified: Secondary | ICD-10-CM | POA: Diagnosis present

## 2020-03-04 DIAGNOSIS — I480 Paroxysmal atrial fibrillation: Secondary | ICD-10-CM

## 2020-03-04 DIAGNOSIS — I272 Pulmonary hypertension, unspecified: Secondary | ICD-10-CM | POA: Diagnosis present

## 2020-03-04 DIAGNOSIS — Z9049 Acquired absence of other specified parts of digestive tract: Secondary | ICD-10-CM | POA: Diagnosis not present

## 2020-03-04 DIAGNOSIS — N179 Acute kidney failure, unspecified: Secondary | ICD-10-CM | POA: Diagnosis not present

## 2020-03-04 DIAGNOSIS — R000 Tachycardia, unspecified: Secondary | ICD-10-CM | POA: Diagnosis not present

## 2020-03-04 DIAGNOSIS — R531 Weakness: Secondary | ICD-10-CM | POA: Diagnosis not present

## 2020-03-04 DIAGNOSIS — I5031 Acute diastolic (congestive) heart failure: Secondary | ICD-10-CM | POA: Diagnosis present

## 2020-03-04 DIAGNOSIS — Z8249 Family history of ischemic heart disease and other diseases of the circulatory system: Secondary | ICD-10-CM | POA: Diagnosis not present

## 2020-03-04 DIAGNOSIS — I340 Nonrheumatic mitral (valve) insufficiency: Secondary | ICD-10-CM | POA: Diagnosis not present

## 2020-03-04 DIAGNOSIS — G4700 Insomnia, unspecified: Secondary | ICD-10-CM | POA: Diagnosis not present

## 2020-03-04 DIAGNOSIS — I5033 Acute on chronic diastolic (congestive) heart failure: Secondary | ICD-10-CM | POA: Diagnosis not present

## 2020-03-04 DIAGNOSIS — I351 Nonrheumatic aortic (valve) insufficiency: Secondary | ICD-10-CM | POA: Diagnosis not present

## 2020-03-04 DIAGNOSIS — F410 Panic disorder [episodic paroxysmal anxiety] without agoraphobia: Secondary | ICD-10-CM | POA: Diagnosis present

## 2020-03-04 DIAGNOSIS — R112 Nausea with vomiting, unspecified: Secondary | ICD-10-CM | POA: Diagnosis not present

## 2020-03-04 DIAGNOSIS — E876 Hypokalemia: Secondary | ICD-10-CM | POA: Diagnosis present

## 2020-03-04 DIAGNOSIS — Z880 Allergy status to penicillin: Secondary | ICD-10-CM

## 2020-03-04 DIAGNOSIS — I10 Essential (primary) hypertension: Secondary | ICD-10-CM | POA: Diagnosis not present

## 2020-03-04 DIAGNOSIS — K5090 Crohn's disease, unspecified, without complications: Secondary | ICD-10-CM | POA: Diagnosis present

## 2020-03-04 DIAGNOSIS — Z20822 Contact with and (suspected) exposure to covid-19: Secondary | ICD-10-CM | POA: Diagnosis not present

## 2020-03-04 DIAGNOSIS — E559 Vitamin D deficiency, unspecified: Secondary | ICD-10-CM | POA: Diagnosis not present

## 2020-03-04 DIAGNOSIS — I517 Cardiomegaly: Secondary | ICD-10-CM | POA: Diagnosis not present

## 2020-03-04 DIAGNOSIS — R5381 Other malaise: Secondary | ICD-10-CM | POA: Diagnosis not present

## 2020-03-04 LAB — BASIC METABOLIC PANEL
Anion gap: 20 — ABNORMAL HIGH (ref 5–15)
BUN: 47 mg/dL — ABNORMAL HIGH (ref 8–23)
CO2: 22 mmol/L (ref 22–32)
Calcium: 10.1 mg/dL (ref 8.9–10.3)
Chloride: 99 mmol/L (ref 98–111)
Creatinine, Ser: 2.02 mg/dL — ABNORMAL HIGH (ref 0.44–1.00)
GFR, Estimated: 25 mL/min — ABNORMAL LOW (ref >60)
Glucose, Bld: 175 mg/dL — ABNORMAL HIGH (ref 70–99)
Potassium: 4.5 mmol/L (ref 3.5–5.1)
Sodium: 141 mmol/L (ref 135–145)

## 2020-03-04 LAB — CBC
HCT: 40.6 % (ref 36.0–46.0)
Hemoglobin: 12.8 g/dL (ref 12.0–15.0)
MCH: 30.3 pg (ref 26.0–34.0)
MCHC: 31.5 g/dL (ref 30.0–36.0)
MCV: 96 fL (ref 80.0–100.0)
Platelets: 213 10*3/uL (ref 150–400)
RBC: 4.23 MIL/uL (ref 3.87–5.11)
RDW: 14.5 % (ref 11.5–15.5)
WBC: 16.1 10*3/uL — ABNORMAL HIGH (ref 4.0–10.5)
nRBC: 0 % (ref 0.0–0.2)

## 2020-03-04 LAB — RESP PANEL BY RT-PCR (FLU A&B, COVID) ARPGX2
Influenza A by PCR: NEGATIVE
Influenza B by PCR: NEGATIVE
SARS Coronavirus 2 by RT PCR: NEGATIVE

## 2020-03-04 LAB — BRAIN NATRIURETIC PEPTIDE: B Natriuretic Peptide: 877.8 pg/mL — ABNORMAL HIGH (ref 0.0–100.0)

## 2020-03-04 LAB — TROPONIN I (HIGH SENSITIVITY)
Troponin I (High Sensitivity): 144 ng/L (ref <18)
Troponin I (High Sensitivity): 149 ng/L (ref <18)

## 2020-03-04 IMAGING — DX DG CHEST 1V PORT
1 series · 1 of 1 positions shown · non-contrast
Comparison: CT [DATE], radiograph [DATE]

CLINICAL DATA: Weakness

EXAM:
PORTABLE CHEST 1 VIEW

[chest ap]
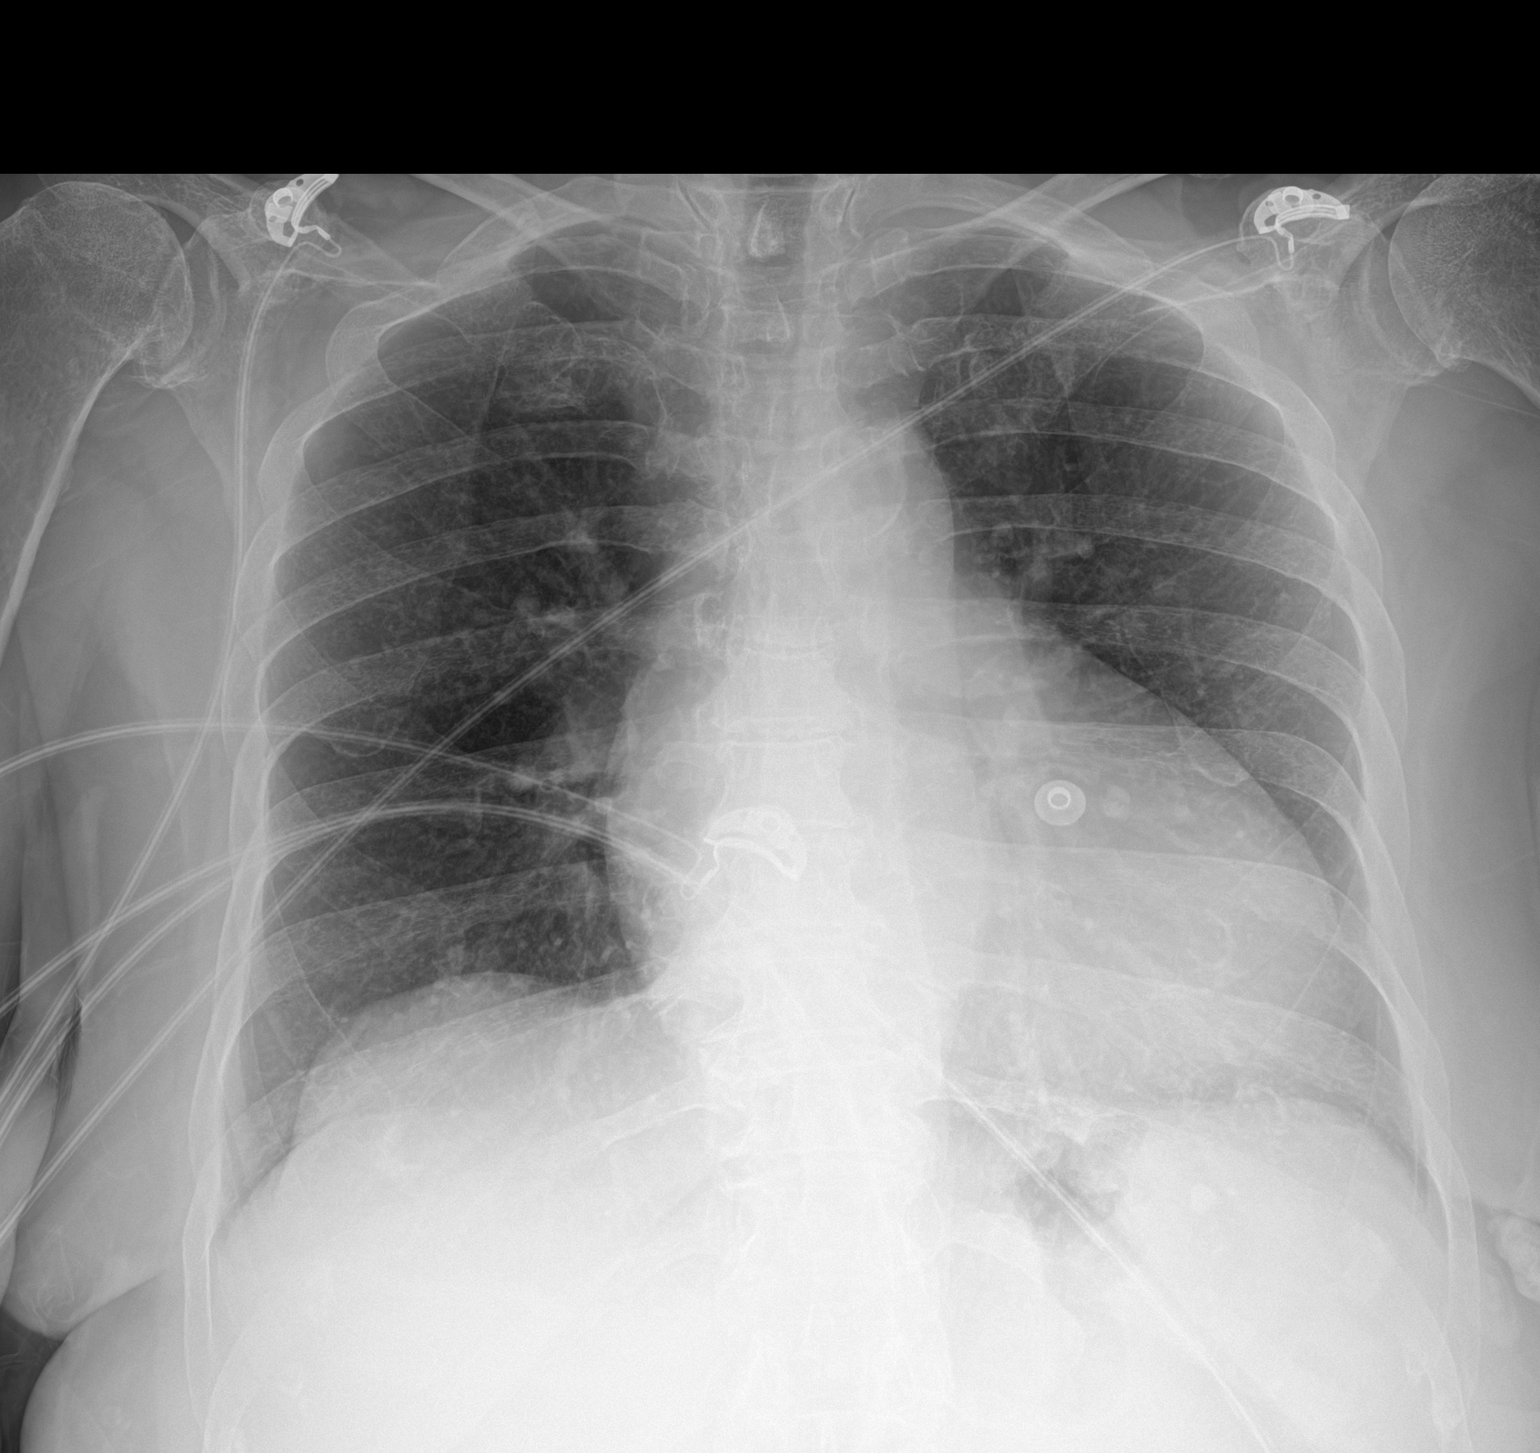

[1 of 1 positions shown; findings below may reference images not displayed]

FINDINGS: Lobular contour deformity of the right hemidiaphragm compatible with
a chronic right diaphragmatic eventration seen on comparison
imaging. Some mild chronically coarsened interstitial and bronchitic
features are quite similar to comparison imaging. No new
consolidative process. Normal distribution of the pulmonary
vascularity without convincing features of edema. Enlarged cardiac
silhouette compatible cardiomegaly seen on prior studies. The aorta
is calcified. The remaining cardiomediastinal contours are
unremarkable. No acute osseous or soft tissue abnormality.
Degenerative changes are present in the imaged spine and shoulders.
Telemetry leads overlie the chest.
IMPRESSION: 1. No acute cardiopulmonary disease.
2. Chronically coarsened interstitial and bronchitic changes are
similar to priors.
3. Chronic right diaphragmatic eventration.
4. Cardiomegaly without convincing features of edema.

## 2020-03-04 MED ORDER — DILTIAZEM HCL-DEXTROSE 125-5 MG/125ML-% IV SOLN (PREMIX)
5.0000 mg/h | INTRAVENOUS | Status: DC
  Administered 2020-03-04: 5 mg/h via INTRAVENOUS
  Filled 2020-03-04 (×2): qty 125

## 2020-03-04 MED ORDER — DILTIAZEM LOAD VIA INFUSION
10.0000 mg | Freq: Once | INTRAVENOUS | Status: AC
  Administered 2020-03-04: 10 mg via INTRAVENOUS
  Filled 2020-03-04: qty 10

## 2020-03-04 NOTE — ED Triage Notes (Signed)
Pt states she has been unable to sleep due to anxiety and issues with her apartment.

## 2020-03-04 NOTE — ED Triage Notes (Signed)
Patient arrived by EMS with complaint of nausea, vomiting and has hx of atrial fib. Reported that patient non-compliant with meds

## 2020-03-04 NOTE — Consult Note (Signed)
Cardiology Consultation:   Patient ID: Daylah Sayavong MRN: [id number]; DOB: 30-Aug-1945  Admit date: 03/04/2020 Date of Consult: 03/05/2020  Primary Care Provider: Martinique, Betty G, MD [place] Cardiologist: No primary care provider on file. [facility] Electrophysiologist:  None    Patient Profile:   Kosha Jaquith is a 75 y.o. female with a hx of Crohn's disease, hypertension, and anxiety who is being seen today for the evaluation of atrial fibrillation at the request of Dr. Sherry Ruffing.  History of Present Illness:   Ms. Weinel was in her usual state of health until approximately 3-4 years ago when she began to feel very fatigued with loss of stamina.  She thorough cardiopulmonary evaluation and was diagnosed ultimately with deconditioning.  More recently, she has had 2 days of severe dyspnea, tachypalpitations, chest discomfort, and nausea that began after getting upset about an issue in her apartment.  She presented to the emergency department where she was found to be in rapid atrial fibrillation.  She has been initiated on a diltiazem drip.  She has never been diagnosed with atrial arrhythmias previously.  She does report occasional palpitations in recent years.  She denies syncope or presyncope.  Past Medical History:  Diagnosis Date  . Anemia   . Anxiety   . Arthritis   . Crohn's disease ([place])   . Depression   . GERD (gastroesophageal reflux disease)   . Heart murmur   . Hypertension   . Reflux     Past Surgical History:  Procedure Laterality Date  . ANKLE SURGERY    . CARPAL TUNNEL RELEASE    . CHOLECYSTECTOMY    . COLON SURGERY    . RIGHT/LEFT HEART CATH AND CORONARY ANGIOGRAPHY N/A 09/15/2016   Procedure: RIGHT/LEFT HEART CATH AND CORONARY ANGIOGRAPHY;  Surgeon: Larey Dresser, MD;  Location: [facility];  Service: Cardiovascular;  Laterality: N/A;    Inpatient Medications: Scheduled Meds:  Continuous Infusions: . diltiazem (CARDIZEM) infusion 5  mg/hr (03/04/20 1800)   PRN Meds:   Allergies:    Allergies  Allergen Reactions  . Penicillin G Nausea Only    Has patient had a PCN reaction causing immediate rash, facial/tongue/throat swelling, SOB or lightheadedness with hypotension:No Has patient had a PCN reaction causing severe rash involving mucus membranes or skin necrosis: No Has patient had a PCN reaction that required hospitalization: No Has patient had a PCN reaction occurring within the last 10 years: No--NAUSEA ONLY If all of the above answers are "NO", then may proceed with Cephalosporin use.     Social History:   Social History   Socioeconomic History  . Marital status: Widowed    Spouse name: Not on file  . Number of children: Not on file  . Years of education: Not on file  . Highest education level: Not on file  Occupational History  . Occupation: retired  Tobacco Use  . Smoking status: Never Smoker  . Smokeless tobacco: Never Used  Vaping Use  . Vaping Use: Never used  Substance and Sexual Activity  . Alcohol use: No  . Drug use: No  . Sexual activity: Not Currently  Other Topics Concern  . Not on file  Social History Narrative  . Not on file   Social Determinants of Health   Financial Resource Strain: Not on file  Food Insecurity: Not on file  Transportation Needs: Not on file  Physical Activity: Not on file  Stress: Not on file  Social Connections: Not on  file  Intimate Partner Violence: Not on file    Family History:   Family History  Problem Relation Age of Onset  . Heart disease Mother        RF  . Stroke Father   . Diabetes Father   . Hyperlipidemia Father   . Hypertension Father      ROS:  Please see the history of present illness.  All other ROS reviewed and negative.     Physical Exam/Data:   Vitals:   03/05/20 0045 03/05/20 0130 03/05/20 0145 03/05/20 0245  BP: 113/75 118/82 122/74 118/68  Pulse: (!) 104 (!) 114 (!) 102 (!) 108  Resp: (!) 21 (!) 24 (!) 21 (!) 23   Temp:      TempSrc:      SpO2: 97% 98% 96% 95%   No intake or output data in the 24 hours ending 03/05/20 0341 Last 3 Weights 01/30/2020 09/16/2019 02/20/2018  Weight (lbs) 157 lb 9.6 oz 159 lb 4 oz 162 lb  Weight (kg) 71.487 kg 72.235 kg 73.483 kg     There is no height or weight on file to calculate BMI.  General:  Well nourished, well developed, in no acute distress HEENT: normal Lymph: no adenopathy Neck: JVP is mildly elevated to mid-neck  Endocrine:  No thryomegaly Vascular: No carotid bruits; FA pulses 2+ bilaterally without bruits  Cardiac:  Irregularly irregular with no murmur  Lungs:  clear to auscultation bilaterally, no wheezing, rhonchi or rales  Abd: soft, nontender, no hepatomegaly  Ext: no edema Musculoskeletal:  No deformities, BUE and BLE strength normal and equal Skin: warm and dry  Neuro:  CNs 2-12 intact, no focal abnormalities noted Psych:  Normal affect   EKG:  The EKG was personally reviewed and demonstrates:  Atrial fibrillation with RVR and T wave flattening.  Telemetry:  Telemetry was personally reviewed and demonstrates:  Atrial fibrillation with RVR   Relevant CV Studies:  Thoracic echocardiogram 10/12/2016 showed normal left ventricular systolic function with an EF of 55 to 60%, mild aortic regurgitation, mild mitral regurgitation, left atrial dilation with a diameter of 4.1 cm.  PA systolic pressure of 37 mmHg.  Pharmacologic nuclear stress test 05/16/2016 showed no evidence of ischemia or infarction and LVEF of 50%.  Cardiac catheterization 09/15/2016 showed normal coronary arteries with mildly elevated left ventricular end-diastolic pressure (15 mmHg)and mean PA pressure 23 mmHg.  Laboratory Data:  High Sensitivity Troponin:   Recent Labs  Lab 03/04/20 1743 03/04/20 1943  TROPONINIHS 144* 149*     Chemistry Recent Labs  Lab 03/04/20 1339  NA 141  K 4.5  CL 99  CO2 22  GLUCOSE 175*  BUN 47*  CREATININE 2.02*  CALCIUM 10.1  GFRNONAA  25*  ANIONGAP 20*    Hematology Recent Labs  Lab 03/04/20 1339  WBC 16.1*  RBC 4.23  HGB 12.8  HCT 40.6  MCV 96.0  MCH 30.3  MCHC 31.5  RDW 14.5  PLT 213   BNP Recent Labs  Lab 03/04/20 1801  BNP 877.8*     Radiology/Studies:  DG Chest Port 1 View  Result Date: 03/04/2020 CLINICAL DATA:  Weakness EXAM: PORTABLE CHEST 1 VIEW COMPARISON:  CT 10/12/2016, radiograph 08/04/2016 FINDINGS: Lobular contour deformity of the right hemidiaphragm compatible with a chronic right diaphragmatic eventration seen on comparison imaging. Some mild chronically coarsened interstitial and bronchitic features are quite similar to comparison imaging. No new consolidative process. Normal distribution of the pulmonary vascularity without convincing features of  edema. Enlarged cardiac silhouette compatible cardiomegaly seen on prior studies. The aorta is calcified. The remaining cardiomediastinal contours are unremarkable. No acute osseous or soft tissue abnormality. Degenerative changes are present in the imaged spine and shoulders. Telemetry leads overlie the chest. IMPRESSION: 1. No acute cardiopulmonary disease. 2. Chronically coarsened interstitial and bronchitic changes are similar to priors. 3. Chronic right diaphragmatic eventration. 4. Cardiomegaly without convincing features of edema. Electronically Signed   By: Lovena Le M.D.   On: 03/04/2020 18:27    Assessment and Plan:   Ms. Darrol Angel is a 75 year old woman presenting with new diagnosis of atrial fibrillation with rapid ventricular response.  She is quite symptomatic while in atrial fibrillation.  She does not appear to have any clear triggers for this episode, particularly no findings of acute coronary syndrome or decompensated heart failure.  We will start her on anticoagulation for stroke prevention titrate diltiazem for rate control.  Regarding rhythm control, she would benefit from return to sinus rhythm given her new diagnosis of atrial  fibrillation and significant symptoms while in atrial fibrillation.  The duration of her atrial fibrillation is unclear, it may have contributed to her loss of stamina over the last several years or may be new today.  It will be important to assess her degree of stamina after cardioversion as an outpatient in the next couple of weeks.  - Admit to cardiology for cardioversion and medical management of atrial fibrillation.   Risk Assessment/Risk Scores:    New York Heart Association (NYHA) Functional Class NYHA Class II  CHA2DS2-VASc Score = 4  This indicates a 4.8% annual risk of stroke. The patient's score is based upon: CHF History: No HTN History: Yes Diabetes History: No Stroke History: No Vascular Disease History: No Age Score: 2 Gender Score: 1      For questions or updates, please contact [place] Please consult [email or web address] for contact info under    Signed, Osvaldo Shipper, MD  03/05/2020 3:41 AM

## 2020-03-04 NOTE — H&P (Signed)
Cardiology Admission History and Physical:   Patient ID: Kristen Gray MRN: [id number]; DOB: July 18, 1945   Admission date: 03/04/2020  Primary Care Provider: Martinique, Betty G, MD [place] Cardiologist: No primary care provider on file.  [facility] Electrophysiologist:  None   Chief Complaint:  dyspnea  Patient Profile:   Kristen Gray is a 75 y.o. female with Crohn's disease, hypertension, and anxiety who is being admitted today for the evaluation and management of atrial fibrillation.  History of Present Illness:   [name] [name] was in her usual state of health until approximately 3-4 years ago when she began to feel very fatigued with loss of stamina.  She thorough cardiopulmonary evaluation and was diagnosed ultimately with deconditioning.  More recently, she has had 2 days of severe dyspnea, tachypalpitations, chest discomfort, and nausea that began after getting upset about an issue in her apartment.  She presented to the emergency department where she was found to be in rapid atrial fibrillation.  She has been initiated on a diltiazem drip.  She has never been diagnosed with atrial arrhythmias previously.  She does report occasional palpitations in recent years.  She denies syncope or presyncope.  Past Medical History:  Diagnosis Date  . Anemia   . Anxiety   . Arthritis   . Crohn's disease ([place])   . Depression   . GERD (gastroesophageal reflux disease)   . Heart murmur   . Hypertension   . Reflux    Past Surgical History:  Procedure Laterality Date  . ANKLE SURGERY    . CARPAL TUNNEL RELEASE    . CHOLECYSTECTOMY    . COLON SURGERY    . RIGHT/LEFT HEART CATH AND CORONARY ANGIOGRAPHY N/A 09/15/2016   Procedure: RIGHT/LEFT HEART CATH AND CORONARY ANGIOGRAPHY;  Surgeon: Larey Dresser, MD;  Location: [facility];  Service: Cardiovascular;  Laterality: N/A;    Medications Prior to Admission: Prior to Admission medications   Medication Sig Start  Date End Date Taking? Authorizing Provider  acetaminophen (TYLENOL) 500 MG tablet Take 1,000 mg by mouth every 6 (six) hours as needed (FOR PAIN.).    [provider]  albuterol (PROAIR HFA) 108 (90 Base) MCG/ACT inhaler Inhale 2 puffs into the lungs every 6 (six) hours as needed for wheezing or shortness of breath. 11/08/19   Martinique, Betty G, MD  atenolol-chlorthalidone (TENORETIC) 50-25 MG tablet TAKE 1 TABLET BY MOUTH EVERY DAY 12/16/19   Martinique, Betty G, MD  balsalazide (COLAZAL) 750 MG capsule Take 2 capsules (1,500 mg total) by mouth 2 (two) times daily. 01/30/20   Zehr, Laban Emperor, PA-C  Calcium Carbonate-Vitamin D (CALTRATE 600+D PO) Take 2 tablets by mouth daily.    [provider]  Cholecalciferol (VITAMIN D3) 50 MCG (2000 UT) capsule TAKE 1 CAPSULE BY MOUTH EVERY DAY 01/20/20   Martinique, Betty G, MD  folic acid (FOLVITE) 1 MG tablet TAKE 1 TABLET BY MOUTH EVERY DAY 12/13/19   Martinique, Betty G, MD  omeprazole (PRILOSEC) 20 MG capsule Take 1 capsule (20 mg total) by mouth daily before breakfast. 07/02/19   Martinique, Betty G, MD  traZODone (DESYREL) 100 MG tablet TAKE 1/2 TABLET BY MOUTH NIGHTLY AT BEDTIME 02/04/20   Martinique, Betty G, MD  venlafaxine XR (EFFEXOR-XR) 75 MG 24 hr capsule Take 2 capsules (150 mg total) by mouth daily with breakfast. 02/04/20   Martinique, Betty G, MD  vitamin B-12 (CYANOCOBALAMIN) 1000 MCG tablet Take 1 tablet (1,000 mcg total) by  mouth daily. 01/20/20   Martin[place], Betty G, MD     Allergies:    Allergies  Allergen Reactions  . Penicillin G Nausea Only    Has patient had a PCN reaction causing immediate rash, facial/tongue/throat swelling, SOB or lightheadedness with hypotension:No Has patient had a PCN reaction causing severe rash involving mucus membranes or skin necrosis: No Has patient had a PCN reaction that required hospitalization: No Has patient had a PCN reaction occurring within the last 10 years: No--NAUSEA ONLY If all of the above answers are  "NO", then may proceed with Cephalosporin use.     Social History:   Social History   Socioeconomic History  . Marital status: Widowed    Spouse name: Not on file  . Number of children: Not on file  . Years of education: Not on file  . Highest education level: Not on file  Occupational History  . Occupation: retired  Tobacco Use  . Smoking status: Never Smoker  . Smokeless tobacco: Never Used  Vaping Use  . Vaping Use: Never used  Substance and Sexual Activity  . Alcohol use: No  . Drug use: No  . Sexual activity: Not Currently  Other Topics Concern  . Not on file  Social History Narrative  . Not on file   Social Determinants of Health   Financial Resource Strain: Not on file  Food Insecurity: Not on file  Transportation Needs: Not on file  Physical Activity: Not on file  Stress: Not on file  Social Connections: Not on file  Intimate Partner Violence: Not on file    Family History:   The patient's family history includes Diabetes in her father; Heart disease in her mother; Hyperlipidemia in her father; Hypertension in her father; Stroke in her father.    ROS:  Please see the history of present illness.  Review of systems is also notable for anxiety. All other ROS reviewed and negative.     Physical Exam/Data:   Vitals:   03/05/20 0045 03/05/20 0130 03/05/20 0145 03/05/20 0245  BP: 113/75 118/82 122/74 118/68  Pulse: (!) 104 (!) 114 (!) 102 (!) 108  Resp: (!) 21 (!) 24 (!) 21 (!) 23  Temp:      TempSrc:      SpO2: 97% 98% 96% 95%   No intake or output data in the 24 hours ending 03/05/20 0349 Last 3 Weights 01/30/2020 09/16/2019 02/20/2018  Weight (lbs) 157 lb 9.6 oz 159 lb 4 oz 162 lb  Weight (kg) 71.487 kg 72.235 kg 73.483 kg     There is no height or weight on file to calculate BMI.  General:  Well nourished, well developed, in no acute distress HEENT: normal Lymph: no adenopathy Neck: mild JVD to the mid-neck at 45 degrees Endocrine:  No  thryomegaly Vascular: No carotid bruits; FA pulses 2+ bilaterally without bruits  Cardiac:  Irregularly irregular with no murmur  Lungs:  clear to auscultation bilaterally, no wheezing, rhonchi or rales  Abd: soft, nontender, no hepatomegaly, surgical scar Ext: no edema Musculoskeletal:  No deformities, BUE and BLE strength normal and equal Skin: warm and dry  Neuro:  CNs 2-12 intact, no focal abnormalities noted Psych:  Normal affect   EKG:  The ECG that was done 03/04/2020 was personally reviewed and demonstrates atrial fibrillation with RVR and T wave flattening.   Relevant CV Studies:  Thoracic echocardiogram 10/12/2016 showed normal left ventricular systolic function with an EF of 55 to 60%, mild aortic regurgitation,  mild mitral regurgitation, left atrial dilation with a diameter of 4.1 cm.  PA systolic pressure of 37 mmHg.  Pharmacologic nuclear stress test 05/16/2016 showed no evidence of ischemia or infarction and LVEF of 50%.  Cardiac catheterization 09/15/2016 showed normal coronary arteries with mildly elevated left ventricular end-diastolic pressure (15 mmHg)and mean PA pressure 23 mmHg. Laboratory Data:  High Sensitivity Troponin:   Recent Labs  Lab 03/04/20 1743 03/04/20 1943  TROPONINIHS 144* 149*      Chemistry Recent Labs  Lab 03/04/20 1339  NA 141  K 4.5  CL 99  CO2 22  GLUCOSE 175*  BUN 47*  CREATININE 2.02*  CALCIUM 10.1  GFRNONAA 25*  ANIONGAP 20*    No results for input(s): PROT, ALBUMIN, AST, ALT, ALKPHOS, BILITOT in the last 168 hours. Hematology Recent Labs  Lab 03/04/20 1339  WBC 16.1*  RBC 4.23  HGB 12.8  HCT 40.6  MCV 96.0  MCH 30.3  MCHC 31.5  RDW 14.5  PLT 213   BNP Recent Labs  Lab 03/04/20 1801  BNP 877.8*    DDimer No results for input(s): DDIMER in the last 168 hours.   Radiology/Studies:  DG Chest Port 1 View  Result Date: 03/04/2020 CLINICAL DATA:  Weakness EXAM: PORTABLE CHEST 1 VIEW COMPARISON:  CT 10/12/2016,  radiograph 08/04/2016 FINDINGS: Lobular contour deformity of the right hemidiaphragm compatible with a chronic right diaphragmatic eventration seen on comparison imaging. Some mild chronically coarsened interstitial and bronchitic features are quite similar to comparison imaging. No new consolidative process. Normal distribution of the pulmonary vascularity without convincing features of edema. Enlarged cardiac silhouette compatible cardiomegaly seen on prior studies. The aorta is calcified. The remaining cardiomediastinal contours are unremarkable. No acute osseous or soft tissue abnormality. Degenerative changes are present in the imaged spine and shoulders. Telemetry leads overlie the chest. IMPRESSION: 1. No acute cardiopulmonary disease. 2. Chronically coarsened interstitial and bronchitic changes are similar to priors. 3. Chronic right diaphragmatic eventration. 4. Cardiomegaly without convincing features of edema. Electronically Signed   By: Lovena Le M.D.   On: 03/04/2020 18:27     Assessment and Plan:   Ms. Darrol Angel is a 75 year old woman presenting with new diagnosis of atrial fibrillation with rapid ventricular response.  She is quite symptomatic while in atrial fibrillation.  She does not appear to have any clear triggers for this episode, particularly no findings of acute coronary syndrome or decompensated heart failure. She did have a normal cardiac catheterization 4 years ago, making obstructive coronary disease a very unlikely source of subacute or acute symptoms. We will start her on anticoagulation for stroke prevention titrate diltiazem for rate control.  Regarding rhythm control, she would benefit from return to sinus rhythm given her new diagnosis of atrial fibrillation and significant symptoms while in atrial fibrillation.  The duration of her atrial fibrillation is unclear, it may have contributed to her loss of stamina over the last several years or may be new today.  It will be  important to assess her degree of stamina after cardioversion as an outpatient in the next couple of weeks.  1. Atrial fibrillation, paroxysmal, with rapid ventricular response -Diltiazem drip with transition to oral diltiazem, starting at 30 mg every 6 hours -We will plan for TEE and cardioversion today.  We will keep her n.p.o. -Her CHA2DS2-VASc score is 4.  Start apixaban 5 mg twice daily.  She does not meet criteria for dose reduction.  -Thoracic echo to evaluate for structural heart disease  2. Acute renal insufficiency, potentially secondary to rapid atrial fibrillation versus venous congestion versus non-cardiac  - urinalysis  - monitor for improvement with rate/rhythm control - she is mildly volume overloaded, will give Lasix 20 mg IV  3. Crohn's disease - continue home balsalazide  4. GERD - PPI   Risk Assessment/Risk Scores:   New York Heart Association (NYHA) Functional Class NYHA Class II  CHA2DS2-VASc Score = 4  This indicates a 4.8% annual risk of stroke. The patient's score is based upon: CHF History: No HTN History: Yes Diabetes History: No Stroke History: No Vascular Disease History: No Age Score: 2 Gender Score: 1  Severity of Illness: The appropriate patient status for this patient is OBSERVATION. Observation status is judged to be reasonable and necessary in order to provide the required intensity of service to ensure the patient's safety. The patient's presenting symptoms, physical exam findings, and initial radiographic and laboratory data in the context of their medical condition is felt to place them at decreased risk for further clinical deterioration. Furthermore, it is anticipated that the patient will be medically stable for discharge from the hospital within 2 midnights of admission. The following factors support the patient status of observation.   " The patient's presenting symptoms include dyspnea on exertion, tachypalpitations. . " The physical  exam findings include tachycardia, anxiety. " The initial radiographic and laboratory data are troponin elevation, atrial fibrillation with RVR, BNP elevation .  For questions or updates, please contact [place] Please consult [email or web address] for contact info under   Signed, Osvaldo Shipper, MD  03/05/2020 3:49 AM

## 2020-03-04 NOTE — ED Provider Notes (Signed)
[facility] EMERGENCY DEPARTMENT Provider Note  CSN: [id number] Arrival date & time: 03/04/20 1249    History Chief Complaint  Patient presents with  . Weakness  . Anxiety    HPI  Kristen Gray is a 75 y.o. female with history of anxiety and crohn's disease reports she has been feeling anxious for about 6 weeks since her upstairs neighbor's toilet leaked into her condo in early January. She reports symptoms of heart racing, weakness, exercise intolerance with SOB walking just a short distance. Denies any CP, cough, fever, N/V/D, she has chronic abdominal pain from Crohn's. She has not sought medical attention for her symptoms and decided to come today because she had 'had enough'. She denies any history of afib. Not taking a blood thinner.    Past Medical History:  Diagnosis Date  . Anemia   . Anxiety   . Arthritis   . Crohn's disease ([place])   . Depression   . GERD (gastroesophageal reflux disease)   . Heart murmur   . Hypertension   . Reflux     Past Surgical History:  Procedure Laterality Date  . ANKLE SURGERY    . CARPAL TUNNEL RELEASE    . CHOLECYSTECTOMY    . COLON SURGERY    . RIGHT/LEFT HEART CATH AND CORONARY ANGIOGRAPHY N/A 09/15/2016   Procedure: RIGHT/LEFT HEART CATH AND CORONARY ANGIOGRAPHY;  Surgeon: Larey Dresser, MD;  Location: [facility];  Service: Cardiovascular;  Laterality: N/A;    Family History  Problem Relation Age of Onset  . Heart disease Mother        RF  . Stroke Father   . Diabetes Father   . Hyperlipidemia Father   . Hypertension Father     Social History   Tobacco Use  . Smoking status: Never Smoker  . Smokeless tobacco: Never Used  Vaping Use  . Vaping Use: Never used  Substance Use Topics  . Alcohol use: No  . Drug use: No     Home Medications Prior to Admission medications   Medication Sig Start Date End Date Taking? Authorizing Provider  acetaminophen (TYLENOL) 500 MG tablet Take 1,000 mg by mouth every 6  (six) hours as needed (FOR PAIN.).    [provider]  albuterol (PROAIR HFA) 108 (90 Base) MCG/ACT inhaler Inhale 2 puffs into the lungs every 6 (six) hours as needed for wheezing or shortness of breath. 11/08/19   [place], Betty G, MD  atenolol-chlorthalidone (TENORETIC) 50-25 MG tablet TAKE 1 TABLET BY MOUTH EVERY DAY 12/16/19   [place], Betty G, MD  balsalazide (COLAZAL) 750 MG capsule Take 2 capsules (1,500 mg total) by mouth 2 (two) times daily. 01/30/20   Zehr, Laban Emperor, PA-C  Calcium Carbonate-Vitamin D (CALTRATE 600+D PO) Take 2 tablets by mouth daily.    [provider]  Cholecalciferol (VITAMIN D3) 50 MCG (2000 UT) capsule TAKE 1 CAPSULE BY MOUTH EVERY DAY 01/20/20   [place], Betty G, MD  folic acid (FOLVITE) 1 MG tablet TAKE 1 TABLET BY MOUTH EVERY DAY 12/13/19   [place], Betty G, MD  omeprazole (PRILOSEC) 20 MG capsule Take 1 capsule (20 mg total) by mouth daily before breakfast. 07/02/19   [place], Betty G, MD  traZODone (DESYREL) 100 MG tablet TAKE 1/2 TABLET BY MOUTH NIGHTLY AT BEDTIME 02/04/20   [place], Betty G, MD  venlafaxine XR (EFFEXOR-XR) 75 MG 24 hr capsule Take 2 capsules (150 mg total) by mouth daily with breakfast. 02/04/20  [place], Betty G, MD  vitamin B-12 (CYANOCOBALAMIN) 1000 MCG tablet Take 1 tablet (1,000 mcg total) by mouth daily. 01/20/20   [place], Betty G, MD     Allergies    Penicillin g   Review of Systems   Review of Systems A comprehensive review of systems was completed and negative except as noted in HPI.    Physical Exam BP 107/65   Pulse 87   Temp (!) 97.4 F (36.3 C)   Resp (!) 21   SpO2 97%   Physical Exam Vitals and nursing note reviewed.  Constitutional:      Appearance: Normal appearance.  HENT:     Head: Normocephalic and atraumatic.     Nose: Nose normal.     Mouth/Throat:     Mouth: Mucous membranes are moist.  Eyes:     Extraocular Movements: Extraocular movements intact.     Conjunctiva/sclera: Conjunctivae  normal.  Cardiovascular:     Rate and Rhythm: Tachycardia present. Rhythm irregular.  Pulmonary:     Effort: Pulmonary effort is normal.     Breath sounds: Normal breath sounds.  Abdominal:     General: Abdomen is flat.     Palpations: Abdomen is soft.     Tenderness: There is no abdominal tenderness.  Musculoskeletal:        General: No swelling. Normal range of motion.     Cervical back: Neck supple.  Skin:    General: Skin is warm and dry.  Neurological:     General: No focal deficit present.     Mental Status: She is alert.  Psychiatric:        Mood and Affect: Mood normal.      ED Results / Procedures / Treatments   Labs (all labs ordered are listed, but only abnormal results are displayed) Labs Reviewed  BASIC METABOLIC PANEL - Abnormal; Notable for the following components:      Result Value   Glucose, Bld 175 (*)    BUN 47 (*)    Creatinine, Ser 2.02 (*)    GFR, Estimated 25 (*)    Anion gap 20 (*)    All other components within normal limits  CBC - Abnormal; Notable for the following components:   WBC 16.1 (*)    All other components within normal limits  BRAIN NATRIURETIC PEPTIDE - Abnormal; Notable for the following components:   B Natriuretic Peptide 877.8 (*)    All other components within normal limits  TROPONIN I (HIGH SENSITIVITY) - Abnormal; Notable for the following components:   Troponin I (High Sensitivity) 144 (*)    All other components within normal limits  TROPONIN I (HIGH SENSITIVITY) - Abnormal; Notable for the following components:   Troponin I (High Sensitivity) 149 (*)    All other components within normal limits  RESP PANEL BY RT-PCR (FLU A&B, COVID) [id number]  URINALYSIS, ROUTINE W REFLEX MICROSCOPIC    EKG EKG Interpretation  Date/Time:  Wednesday March 04 2020 13:27:16 EST Ventricular Rate:  138 PR Interval:    QRS Duration: 64 QT Interval:  278 QTC Calculation: 421 R Axis:   53 Text Interpretation: Atrial fibrillation  with rapid ventricular response Nonspecific T wave abnormality Abnormal ECG Since last tracing Atrial fibrillation with rapid ventricular response has replaced Sinus rhythm Confirmed by Calvert Cantor [phone]) on 03/04/2020 5:12:51 PM   Radiology DG Chest Port 1 View  Result Date: 03/04/2020 CLINICAL DATA:  Weakness EXAM: PORTABLE CHEST 1 VIEW COMPARISON:  CT 10/12/2016, radiograph  08/04/2016 FINDINGS: Lobular contour deformity of the right hemidiaphragm compatible with a chronic right diaphragmatic eventration seen on comparison imaging. Some mild chronically coarsened interstitial and bronchitic features are quite similar to comparison imaging. No new consolidative process. Normal distribution of the pulmonary vascularity without convincing features of edema. Enlarged cardiac silhouette compatible cardiomegaly seen on prior studies. The aorta is calcified. The remaining cardiomediastinal contours are unremarkable. No acute osseous or soft tissue abnormality. Degenerative changes are present in the imaged spine and shoulders. Telemetry leads overlie the chest. IMPRESSION: 1. No acute cardiopulmonary disease. 2. Chronically coarsened interstitial and bronchitic changes are similar to priors. 3. Chronic right diaphragmatic eventration. 4. Cardiomegaly without convincing features of edema. Electronically Signed   By: Lovena Le M.D.   On: 03/04/2020 18:27    Procedures .Critical Care Performed by: Truddie Hidden, MD Authorized by: Truddie Hidden, MD   Critical care provider statement:    Critical care time (minutes):  45   Critical care time was exclusive of:  Separately billable procedures and treating other patients   Critical care was necessary to treat or prevent imminent or life-threatening deterioration of the following conditions:  Cardiac failure and renal failure   Critical care was time spent personally by me on the following activities:  Discussions with consultants, evaluation of  patient's response to treatment, examination of patient, ordering and performing treatments and interventions, ordering and review of laboratory studies, ordering and review of radiographic studies, pulse oximetry, re-evaluation of patient's condition, obtaining history from patient or surrogate and review of old charts   Care discussed with: admitting provider      Medications Ordered in the ED Medications  diltiazem (CARDIZEM) 1 mg/mL load via infusion 10 mg (10 mg Intravenous Bolus from Bag 03/04/20 1800)    And  diltiazem (CARDIZEM) 125 mg in dextrose 5% 125 mL (1 mg/mL) infusion (5 mg/hr Intravenous New Bag/Given 03/04/20 1800)     MDM Rules/Calculators/A&P MDM EKG with rapid afib. This is likely the source of the patient's symptoms and not purely from anxiety/panic attacks as she thought. She has no history and not on anticoagulation. Will begin Cardizem for rate control. Check labs and CXR.  ED Course  I have reviewed the triage vital signs and the nursing notes.  Pertinent labs & imaging results that were available during my care of the patient were reviewed by me and considered in my medical decision making (see chart for details).  Clinical Course as of 03/04/20 2236  Wed Mar 04, 2020  1933 CBC with mild leukocytosis unclear etiology. BMP shows a mild AKI [CS]  1934 BNP mildly elevated as well. CXR is clear.  [CS]  1951 HR improved but remains in Afib. Trop elevated, may be rate related, will discuss with Cards and continue to trend.  [CS]  2030 Spoke with Dr. Launa Grill, Cardiology who will evaluate the patient and make recommendations. Second Trop is pending now.  [CS]  2133 Second Trop is flat. Awaiting Cards recs.  [CS]  2235 Per Dr. Launa Grill, she will admit.  [CS]    Clinical Course User Index [CS] Truddie Hidden, MD    Final Clinical Impression(s) / ED Diagnoses Final diagnoses:  Atrial fibrillation with RVR ([place])  AKI (acute kidney injury) [facility])    Rx / DC Orders ED  Discharge Orders    None       Truddie Hidden, MD 03/04/20 2236

## 2020-03-05 ENCOUNTER — Observation Stay (HOSPITAL_BASED_OUTPATIENT_CLINIC_OR_DEPARTMENT_OTHER): Payer: PPO

## 2020-03-05 ENCOUNTER — Inpatient Hospital Stay (HOSPITAL_COMMUNITY): Payer: PPO | Admitting: Certified Registered Nurse Anesthetist

## 2020-03-05 ENCOUNTER — Encounter (HOSPITAL_COMMUNITY): Payer: Self-pay | Admitting: Cardiology

## 2020-03-05 ENCOUNTER — Encounter (HOSPITAL_COMMUNITY)
Admission: EM | Disposition: A | Discharge status: Home / Self Care | Payer: Self-pay | Source: Home / Self Care | Attending: Cardiology

## 2020-03-05 ENCOUNTER — Inpatient Hospital Stay (HOSPITAL_COMMUNITY): Payer: PPO

## 2020-03-05 DIAGNOSIS — I5031 Acute diastolic (congestive) heart failure: Secondary | ICD-10-CM | POA: Diagnosis present

## 2020-03-05 DIAGNOSIS — I351 Nonrheumatic aortic (valve) insufficiency: Secondary | ICD-10-CM | POA: Diagnosis not present

## 2020-03-05 DIAGNOSIS — N183 Chronic kidney disease, stage 3 unspecified: Secondary | ICD-10-CM | POA: Diagnosis present

## 2020-03-05 DIAGNOSIS — Z880 Allergy status to penicillin: Secondary | ICD-10-CM | POA: Diagnosis not present

## 2020-03-05 DIAGNOSIS — I4891 Unspecified atrial fibrillation: Secondary | ICD-10-CM | POA: Diagnosis present

## 2020-03-05 DIAGNOSIS — I480 Paroxysmal atrial fibrillation: Secondary | ICD-10-CM | POA: Diagnosis not present

## 2020-03-05 DIAGNOSIS — I272 Pulmonary hypertension, unspecified: Secondary | ICD-10-CM | POA: Diagnosis present

## 2020-03-05 DIAGNOSIS — E876 Hypokalemia: Secondary | ICD-10-CM | POA: Diagnosis present

## 2020-03-05 DIAGNOSIS — F410 Panic disorder [episodic paroxysmal anxiety] without agoraphobia: Secondary | ICD-10-CM | POA: Diagnosis present

## 2020-03-05 DIAGNOSIS — Z8249 Family history of ischemic heart disease and other diseases of the circulatory system: Secondary | ICD-10-CM | POA: Diagnosis not present

## 2020-03-05 DIAGNOSIS — I4819 Other persistent atrial fibrillation: Secondary | ICD-10-CM | POA: Diagnosis present

## 2020-03-05 DIAGNOSIS — I130 Hypertensive heart and chronic kidney disease with heart failure and stage 1 through stage 4 chronic kidney disease, or unspecified chronic kidney disease: Secondary | ICD-10-CM | POA: Diagnosis present

## 2020-03-05 DIAGNOSIS — I10 Essential (primary) hypertension: Secondary | ICD-10-CM | POA: Diagnosis not present

## 2020-03-05 DIAGNOSIS — Z79891 Long term (current) use of opiate analgesic: Secondary | ICD-10-CM | POA: Diagnosis not present

## 2020-03-05 DIAGNOSIS — Z79899 Other long term (current) drug therapy: Secondary | ICD-10-CM | POA: Diagnosis not present

## 2020-03-05 DIAGNOSIS — I5033 Acute on chronic diastolic (congestive) heart failure: Secondary | ICD-10-CM | POA: Diagnosis not present

## 2020-03-05 DIAGNOSIS — K5090 Crohn's disease, unspecified, without complications: Secondary | ICD-10-CM | POA: Diagnosis present

## 2020-03-05 DIAGNOSIS — Z20822 Contact with and (suspected) exposure to covid-19: Secondary | ICD-10-CM | POA: Diagnosis present

## 2020-03-05 DIAGNOSIS — K219 Gastro-esophageal reflux disease without esophagitis: Secondary | ICD-10-CM | POA: Diagnosis present

## 2020-03-05 DIAGNOSIS — Z9049 Acquired absence of other specified parts of digestive tract: Secondary | ICD-10-CM | POA: Diagnosis not present

## 2020-03-05 DIAGNOSIS — N179 Acute kidney failure, unspecified: Secondary | ICD-10-CM | POA: Diagnosis present

## 2020-03-05 DIAGNOSIS — F32A Depression, unspecified: Secondary | ICD-10-CM | POA: Diagnosis present

## 2020-03-05 DIAGNOSIS — E559 Vitamin D deficiency, unspecified: Secondary | ICD-10-CM | POA: Diagnosis not present

## 2020-03-05 DIAGNOSIS — I340 Nonrheumatic mitral (valve) insufficiency: Secondary | ICD-10-CM | POA: Diagnosis not present

## 2020-03-05 HISTORY — PX: CARDIOVERSION: SHX1299

## 2020-03-05 HISTORY — PX: TEE WITHOUT CARDIOVERSION: SHX5443

## 2020-03-05 LAB — CBC
HCT: 34.2 % — ABNORMAL LOW (ref 36.0–46.0)
Hemoglobin: 11.3 g/dL — ABNORMAL LOW (ref 12.0–15.0)
MCH: 31 pg (ref 26.0–34.0)
MCHC: 33 g/dL (ref 30.0–36.0)
MCV: 94 fL (ref 80.0–100.0)
Platelets: 191 10*3/uL (ref 150–400)
RBC: 3.64 MIL/uL — ABNORMAL LOW (ref 3.87–5.11)
RDW: 14.6 % (ref 11.5–15.5)
WBC: 14.4 10*3/uL — ABNORMAL HIGH (ref 4.0–10.5)
nRBC: 0.1 % (ref 0.0–0.2)

## 2020-03-05 LAB — BASIC METABOLIC PANEL
Anion gap: 16 — ABNORMAL HIGH (ref 5–15)
BUN: 49 mg/dL — ABNORMAL HIGH (ref 8–23)
CO2: 23 mmol/L (ref 22–32)
Calcium: 9.2 mg/dL (ref 8.9–10.3)
Chloride: 99 mmol/L (ref 98–111)
Creatinine, Ser: 1.72 mg/dL — ABNORMAL HIGH (ref 0.44–1.00)
GFR, Estimated: 30 mL/min — ABNORMAL LOW (ref >60)
Glucose, Bld: 81 mg/dL (ref 70–99)
Potassium: 3.5 mmol/L (ref 3.5–5.1)
Sodium: 138 mmol/L (ref 135–145)

## 2020-03-05 LAB — ECHOCARDIOGRAM COMPLETE
MV M vel: 4.94 m/s
MV Peak grad: 97.6 mmHg
P 1/2 time: 380 msec
Radius: 0.4 cm
S' Lateral: 3.6 cm

## 2020-03-05 LAB — T4, FREE: Free T4: 2.06 ng/dL — ABNORMAL HIGH (ref 0.61–1.12)

## 2020-03-05 LAB — TSH: TSH: 1.886 u[IU]/mL (ref 0.350–4.500)

## 2020-03-05 SURGERY — ECHOCARDIOGRAM, TRANSESOPHAGEAL
Anesthesia: Monitor Anesthesia Care

## 2020-03-05 MED ORDER — PROPOFOL 500 MG/50ML IV EMUL
INTRAVENOUS | Status: DC | PRN
  Administered 2020-03-05: 125 ug/kg/min via INTRAVENOUS

## 2020-03-05 MED ORDER — PHENYLEPHRINE HCL (PRESSORS) 10 MG/ML IV SOLN
INTRAVENOUS | Status: DC | PRN
  Administered 2020-03-05: 120 ug via INTRAVENOUS
  Administered 2020-03-05: 80 ug via INTRAVENOUS

## 2020-03-05 MED ORDER — DILTIAZEM HCL 60 MG PO TABS
30.0000 mg | ORAL_TABLET | Freq: Four times a day (QID) | ORAL | Status: DC
  Administered 2020-03-05 – 2020-03-06 (×4): 30 mg via ORAL
  Filled 2020-03-05 (×6): qty 1

## 2020-03-05 MED ORDER — LIDOCAINE HCL (CARDIAC) PF 100 MG/5ML IV SOSY
PREFILLED_SYRINGE | INTRAVENOUS | Status: DC | PRN
  Administered 2020-03-05: 80 mg via INTRATRACHEAL

## 2020-03-05 MED ORDER — BALSALAZIDE DISODIUM 750 MG PO CAPS
1500.0000 mg | ORAL_CAPSULE | Freq: Two times a day (BID) | ORAL | Status: DC
  Administered 2020-03-05 – 2020-03-09 (×9): 1500 mg via ORAL
  Filled 2020-03-05 (×12): qty 2

## 2020-03-05 MED ORDER — SODIUM CHLORIDE 0.9 % IV SOLN: INTRAVENOUS | Status: DC | PRN

## 2020-03-05 MED ORDER — PANTOPRAZOLE SODIUM 40 MG PO TBEC
40.0000 mg | DELAYED_RELEASE_TABLET | Freq: Every day | ORAL | Status: DC
  Administered 2020-03-05 – 2020-03-09 (×5): 40 mg via ORAL
  Filled 2020-03-05 (×5): qty 1

## 2020-03-05 MED ORDER — VENLAFAXINE HCL ER 150 MG PO CP24
150.0000 mg | ORAL_CAPSULE | Freq: Every day | ORAL | Status: DC
  Administered 2020-03-05 – 2020-03-09 (×5): 150 mg via ORAL
  Filled 2020-03-05 (×6): qty 1

## 2020-03-05 MED ORDER — METOPROLOL SUCCINATE ER 25 MG PO TB24
25.0000 mg | ORAL_TABLET | Freq: Every day | ORAL | Status: DC
Start: 2020-03-05 — End: 2020-03-06
  Administered 2020-03-05: 25 mg via ORAL
  Filled 2020-03-05: qty 1

## 2020-03-05 MED ORDER — FUROSEMIDE 10 MG/ML IJ SOLN
20.0000 mg | Freq: Once | INTRAMUSCULAR | Status: AC
  Administered 2020-03-05: 20 mg via INTRAVENOUS
  Filled 2020-03-05: qty 2

## 2020-03-05 MED ORDER — ALBUTEROL SULFATE HFA 108 (90 BASE) MCG/ACT IN AERS
2.0000 | INHALATION_SPRAY | Freq: Four times a day (QID) | RESPIRATORY_TRACT | Status: DC | PRN
  Filled 2020-03-05: qty 6.7

## 2020-03-05 MED ORDER — TRAZODONE HCL 50 MG PO TABS
50.0000 mg | ORAL_TABLET | Freq: Every evening | ORAL | Status: DC | PRN
  Administered 2020-03-06 – 2020-03-08 (×4): 50 mg via ORAL
  Filled 2020-03-05 (×4): qty 1

## 2020-03-05 MED ORDER — ACETAMINOPHEN 325 MG PO TABS: 650.0000 mg | ORAL_TABLET | ORAL | Status: DC | PRN

## 2020-03-05 MED ORDER — APIXABAN 5 MG PO TABS
5.0000 mg | ORAL_TABLET | Freq: Two times a day (BID) | ORAL | Status: DC
  Administered 2020-03-05 – 2020-03-09 (×10): 5 mg via ORAL
  Filled 2020-03-05 (×10): qty 1

## 2020-03-05 MED ORDER — LACTATED RINGERS IV SOLN: INTRAVENOUS | Status: DC | PRN

## 2020-03-05 MED ORDER — ONDANSETRON HCL 4 MG/2ML IJ SOLN
4.0000 mg | Freq: Four times a day (QID) | INTRAMUSCULAR | Status: DC | PRN
  Administered 2020-03-07: 4 mg via INTRAVENOUS
  Filled 2020-03-05: qty 2

## 2020-03-05 MED ORDER — ENSURE ENLIVE PO LIQD
237.0000 mL | Freq: Two times a day (BID) | ORAL | Status: DC
  Administered 2020-03-07: 237 mL via ORAL

## 2020-03-05 MED ORDER — PROPOFOL 10 MG/ML IV BOLUS
INTRAVENOUS | Status: DC | PRN
  Administered 2020-03-05 (×2): 20 mg via INTRAVENOUS

## 2020-03-05 MED ORDER — APIXABAN 5 MG PO TABS
5.0000 mg | ORAL_TABLET | ORAL | Status: AC
  Administered 2020-03-05: 5 mg via ORAL
  Filled 2020-03-05: qty 1

## 2020-03-05 NOTE — CV Procedure (Signed)
Procedure: TEE  Sedation: Per anesthesiology.   Indication: Atrial fibrillation  Findings: Please see echo section for full report.  Normal LV size and wall thickness.  EF 50-55%, no discrete wall motion abnormalities.  Normal RV size and systolic function.  Moderate LAE, no LA appendage thrombus.  Mild RAE.  Moderate TR, peak RV-RA gradient 20 mmHg.  Moderate central MR.  Trileaflet aortic valve with mild AI, no AS.  No PFO or ASD by color doppler.  Normal caliber descending thoracic aorta with minimal plaque.   Impression:  May proceed to DCCV.   [name] 03/05/2020 2:23 PM

## 2020-03-05 NOTE — Discharge Instructions (Signed)

## 2020-03-05 NOTE — Anesthesia Procedure Notes (Signed)
Procedure Name: MAC Date/Time: 03/05/2020 1:58 PM Performed by: Kathryne Hitch, CRNA Pre-anesthesia Checklist: Patient identified, Emergency Drugs available, Suction available and Patient being monitored Patient Re-evaluated:Patient Re-evaluated prior to induction Oxygen Delivery Method: Nasal cannula Preoxygenation: Pre-oxygenation with 100% oxygen Induction Type: IV induction Dental Injury: Teeth and Oropharynx as per pre-operative assessment

## 2020-03-05 NOTE — Anesthesia Preprocedure Evaluation (Addendum)
Anesthesia Evaluation  Patient identified by MRN, date of birth, ID band Patient awake    Reviewed: Allergy & Precautions, NPO status , Patient's Chart, lab work & pertinent test results  History of Anesthesia Complications Negative for: history of anesthetic complications  Airway Mallampati: II  TM Distance: >3 FB Neck ROM: Full    Dental  (+) Dental Advisory Given, Poor Dentition   Pulmonary shortness of breath, COPD,    breath sounds clear to auscultation       Cardiovascular hypertension, Pt. on medications and Pt. on home beta blockers (-) angina+ dysrhythmias Atrial Fibrillation + Valvular Problems/Murmurs MR and AI  Rhythm:Irregular Rate:Tachycardia  03/05/2020 ECHO: EF 50%, global LV hypokinesis, normal RVF, mod MR, mod TR, mild AI   Neuro/Psych Anxiety Depression    GI/Hepatic Neg liver ROS, GERD  Medicated and Controlled,  Endo/Other  negative endocrine ROS  Renal/GU Renal InsufficiencyRenal disease (creat 1.72)     Musculoskeletal  (+) Arthritis ,   Abdominal   Peds  Hematology eliquis   Anesthesia Other Findings   Reproductive/Obstetrics                            Anesthesia Physical Anesthesia Plan  ASA: III  Anesthesia Plan: MAC and General   Post-op Pain Management:    Induction:   PONV Risk Score and Plan: 3 and Ondansetron and Treatment may vary due to age or medical condition  Airway Management Planned: Natural Airway and Nasal Cannula  Additional Equipment: None  Intra-op Plan:   Post-operative Plan:   Informed Consent: I have reviewed the patients History and Physical, chart, labs and discussed the procedure including the risks, benefits and alternatives for the proposed anesthesia with the patient or authorized representative who has indicated his/her understanding and acceptance.     Dental advisory given  Plan Discussed with: CRNA and  Surgeon  Anesthesia Plan Comments: (Plan MAC for TEE, then GA if needed for CV)       Anesthesia Quick Evaluation

## 2020-03-05 NOTE — Procedures (Signed)
Electrical Cardioversion Procedure Note Kristen Gray [id number] 03-25-1945  Procedure: Electrical Cardioversion Indications:  Atrial Fibrillation  Procedure Details Consent: Risks of procedure as well as the alternatives and risks of each were explained to the (patient/caregiver).  Consent for procedure obtained. Time Out: Verified patient identification, verified procedure, site/side was marked, verified correct patient position, special equipment/implants available, medications/allergies/relevent history reviewed, required imaging and test results available.  Performed  Patient placed on cardiac monitor, pulse oximetry, supplemental oxygen as necessary.  Sedation given: Propofol Pacer pads placed anterior and posterior chest.  Cardioverted 2 time(s).  Cardioverted at [place].  Evaluation Findings: Post procedure EKG shows: NSR with frequent PACs.  I am concerned that she will not stay in NSR.  Complications: None Patient did tolerate procedure well.  With discuss with EP, if she does not remain in NSR, consider Tikosyn.    [name] 03/05/2020, 2:24 PM

## 2020-03-05 NOTE — H&P (View-Only) (Signed)
Patient ID: Kristen Gray, female   DOB: [birth year], 75 y.o.   MRN: [id number]     Advanced Heart Failure Rounding Note  PCP-Cardiologist: No primary care provider on file.   Subjective:    No dyspnea at rest but feels palpitations.  Remains in atrial fibrillation on diltiazem gtt at 5 mg/hr.  HR in 90s.  Creatinine lower at 1.72.   Echo: EF 50%, mild diffuse hypokinesis, normal RV, moderate MR, moderate TR, mild AI, PASP 46 with normal sized IVC.    Objective:   Weight Range:   There is no height or weight on file to calculate BMI.   Vital Signs:   Temp:  [97.4 F (36.3 C)-98.1 F (36.7 C)] 98.1 F (36.7 C) (02/10 0653) Pulse Rate:  [68-158] 96 (02/10 1106) Resp:  [18-30] 21 (02/10 1106) BP: (102-131)/(42-95) 102/50 (02/10 1106) SpO2:  [90 %-100 %] 94 % (02/10 1106)    Weight change: There were no vitals filed for this visit.  Intake/Output:  No intake or output data in the 24 hours ending 03/05/20 1146    Physical Exam    General:  Well appearing. No resp difficulty HEENT: Normal Neck: Supple. JVP 8-9 cm. Carotids 2+ bilat; no bruits. No lymphadenopathy or thyromegaly appreciated. Cor: PMI nondisplaced. Irregular rate & rhythm. No rubs, gallops or murmurs. Lungs: Clear Abdomen: Soft, nontender, nondistended. No hepatosplenomegaly. No bruits or masses. Good bowel sounds. Extremities: No cyanosis, clubbing, rash, edema Neuro: Alert & orientedx3, cranial nerves grossly intact. moves all 4 extremities w/o difficulty. Affect pleasant   Telemetry   Atrial fibrillation rate 90s (personally reviewed)   Labs    CBC Recent Labs    03/04/20 1339 03/05/20 0609  WBC 16.1* 14.4*  HGB 12.8 11.3*  HCT 40.6 34.2*  MCV 96.0 94.0  PLT 213 809   Basic Metabolic Panel Recent Labs    03/04/20 1339 03/05/20 0609  NA 141 138  K 4.5 3.5  CL 99 99  CO2 22 23  GLUCOSE 175* 81  BUN 47* 49*  CREATININE 2.02* 1.72*  CALCIUM 10.1 9.2   Liver Function Tests No  results for input(s): AST, ALT, ALKPHOS, BILITOT, PROT, ALBUMIN in the last 72 hours. No results for input(s): LIPASE, AMYLASE in the last 72 hours. Cardiac Enzymes No results for input(s): CKTOTAL, CKMB, CKMBINDEX, TROPONINI in the last 72 hours.  BNP: BNP (last 3 results) Recent Labs    03/04/20 1801  BNP 877.8*    ProBNP (last 3 results) No results for input(s): PROBNP in the last 8760 hours.   D-Dimer No results for input(s): DDIMER in the last 72 hours. Hemoglobin A1C No results for input(s): HGBA1C in the last 72 hours. Fasting Lipid Panel No results for input(s): CHOL, HDL, LDLCALC, TRIG, CHOLHDL, LDLDIRECT in the last 72 hours. Thyroid Function Tests Recent Labs    03/05/20 0609  TSH 1.886    Other results:   Imaging    DG Chest Port 1 View  Result Date: 03/04/2020 CLINICAL DATA:  Weakness EXAM: PORTABLE CHEST 1 VIEW COMPARISON:  CT 10/12/2016, radiograph 08/04/2016 FINDINGS: Lobular contour deformity of the right hemidiaphragm compatible with a chronic right diaphragmatic eventration seen on comparison imaging. Some mild chronically coarsened interstitial and bronchitic features are quite similar to comparison imaging. No new consolidative process. Normal distribution of the pulmonary vascularity without convincing features of edema. Enlarged cardiac silhouette compatible cardiomegaly seen on prior studies. The aorta is calcified. The remaining cardiomediastinal contours are unremarkable. No acute osseous  or soft tissue abnormality. Degenerative changes are present in the imaged spine and shoulders. Telemetry leads overlie the chest. IMPRESSION: 1. No acute cardiopulmonary disease. 2. Chronically coarsened interstitial and bronchitic changes are similar to priors. 3. Chronic right diaphragmatic eventration. 4. Cardiomegaly without convincing features of edema. Electronically Signed   By: Lovena Le M.D.   On: 03/04/2020 18:27   ECHOCARDIOGRAM COMPLETE  Result Date:  03/05/2020    ECHOCARDIOGRAM REPORT   Patient Name:   Kristen Gray Date of Exam: 03/05/2020 Medical Rec #:  [id number]      Height:       58.0 in Accession #:    [id number]     Weight:       157.6 lb Date of Birth:  1945-12-16      BSA:          1.646 m Patient Age:    7 years       BP:           118/73 mmHg Patient Gender: F              HR:           107 bpm. Exam Location:  Inpatient Procedure: 2D Echo, Color Doppler and Cardiac Doppler Indications:    I48.91* Unspeicified atrial fibrillation  History:        Patient has prior history of Echocardiogram examinations, most                 recent 10/12/2016. Risk Factors:Hypertension and Diabetes.  Sonographer:    Bernadene Person RDCS Referring Phys: [id number] [place]  1. Left ventricular ejection fraction, by estimation, is 50%. The left ventricle has mildly decreased function. The left ventricle demonstrates global hypokinesis. Left ventricular diastolic parameters are indeterminate.  2. Right ventricular systolic function is normal. The right ventricular size is normal. There is moderately elevated pulmonary artery systolic pressure. The estimated right ventricular systolic pressure is 36.6 mmHg.  3. Left atrial size was mildly dilated.  4. A small pericardial effusion is present.  5. The mitral valve is normal in structure. Moderate mitral valve regurgitation. No evidence of mitral stenosis.  6. Tricuspid valve regurgitation is moderate.  7. The aortic valve is tricuspid. Aortic valve regurgitation is mild. No aortic stenosis is present.  8. The inferior vena cava is normal in size with greater than 50% respiratory variability, suggesting right atrial pressure of 3 mmHg.  9. The patient was in atrial fibrillation. FINDINGS  Left Ventricle: Left ventricular ejection fraction, by estimation, is 50%. The left ventricle has mildly decreased function. The left ventricle demonstrates global hypokinesis. The left ventricular internal cavity size was  normal in size. There is no left ventricular hypertrophy. Left ventricular diastolic parameters are indeterminate. Right Ventricle: The right ventricular size is normal. No increase in right ventricular wall thickness. Right ventricular systolic function is normal. There is moderately elevated pulmonary artery systolic pressure. The tricuspid regurgitant velocity is 3.29 m/s, and with an assumed right atrial pressure of 3 mmHg, the estimated right ventricular systolic pressure is 29.4 mmHg. Left Atrium: Left atrial size was mildly dilated. Right Atrium: Right atrial size was normal in size. Pericardium: A small pericardial effusion is present. Mitral Valve: The mitral valve is normal in structure. Moderate mitral valve regurgitation. No evidence of mitral valve stenosis. Tricuspid Valve: The tricuspid valve is normal in structure. Tricuspid valve regurgitation is moderate. Aortic Valve: The aortic valve is tricuspid. Aortic valve regurgitation is mild. Aortic  regurgitation PHT measures 380 msec. No aortic stenosis is present. Pulmonic Valve: The pulmonic valve was normal in structure. Pulmonic valve regurgitation is not visualized. Aorta: The aortic root is normal in size and structure. Venous: The inferior vena cava is normal in size with greater than 50% respiratory variability, suggesting right atrial pressure of 3 mmHg. IAS/Shunts: No atrial level shunt detected by color flow Doppler.  LEFT VENTRICLE PLAX 2D LVIDd:         5.20 cm LVIDs:         3.60 cm LV PW:         0.60 cm LV IVS:        0.60 cm LVOT diam:     1.70 cm LV SV:         33 LV SV Index:   20 LVOT Area:     2.27 cm  RIGHT VENTRICLE TAPSE (M-mode): 1.6 cm LEFT ATRIUM             Index       RIGHT ATRIUM           Index LA diam:        4.60 cm 2.80 cm/m  RA Area:     14.50 cm LA Vol (A2C):   58.7 ml 35.67 ml/m RA Volume:   35.90 ml  21.81 ml/m LA Vol (A4C):   57.7 ml 35.06 ml/m LA Biplane Vol: 58.5 ml 35.55 ml/m  AORTIC VALVE LVOT Vmax:    72.23 cm/s LVOT Vmean:  52.067 cm/s LVOT VTI:    0.144 m AI PHT:      380 msec  AORTA Ao Root diam: 2.40 cm Ao Asc diam:  2.40 cm MR Peak grad:    97.6 mmHg   TRICUSPID VALVE MR Mean grad:    63.0 mmHg   TR Peak grad:   43.3 mmHg MR Vmax:         494.00 cm/s TR Vmax:        329.00 cm/s MR Vmean:        373.0 cm/s MR PISA:         1.01 cm    SHUNTS MR PISA Eff ROA: 6 mm       Systemic VTI:  0.14 m MR PISA Radius:  0.40 cm     Systemic Diam: 1.70 cm [name] MD Electronically signed by [name] MD Signature Date/Time: 03/05/2020/10:46:06 AM    Final       Medications:     Scheduled Medications: . apixaban  5 mg Oral BID  . balsalazide  1,500 mg Oral BID WC  . diltiazem  30 mg Oral Q6H  . pantoprazole  40 mg Oral Daily  . venlafaxine XR  150 mg Oral Q breakfast     Infusions: . diltiazem (CARDIZEM) infusion 5 mg/hr (03/04/20 1800)     PRN Medications:  acetaminophen, albuterol, ondansetron (ZOFRAN) IV, traZODone    Assessment/Plan   1. Atrial fibrillation: New diagnosis.  By symptoms, suspect she has been in it for at least 2 wks (palpitations, increased dyspnea).  Rate controlled on diltiazem gtt.  Echo today with EF 50%, mild diffuse hypokinesis, normal RV, moderate MR, moderate TR, mild AI, PASP 46 with normal sized IVC.  CHADSVASC 3.  She has started on apixaban, had 5 mg this morning. Will need conversion to NSR given significant symptoms in atrial fibrillation.  - Will give her another 5 mg apixaban for 10 mg (loading dose per EMANATE study).  -  Continue diltiazem gtt.  - TEE-guided DCCV later today. Discussed risks/benefits and she agrees to procedure.  2. Acute diastolic CHF: Echo as above.  She has JVD but IVC small on echo.  With elevated creatinine, will hold off on diuresis and aim for cardioversion.  3. ?AKI on CKD stage 3: Unsure of baseline creatinine, was up to 2 at admission, now 1.7.  Follow closely.  4. Chronic dyspnea: She had an extensive workup with  me in 2018 including unremarkable cath and high resolution CT chest.  5. Thyroid: Normal TSH but elevated free T4.  Repeat TSH, free T3, and free T4 in am.   Length of Stay: 0  [name], MD  03/05/2020, 11:46 AM  Advanced Heart Failure Team Pager [phone] (M-F; [place])  Please contact [facility] for night-coverage after hours (4p -7a ) and weekends on [email or web address]

## 2020-03-05 NOTE — ED Notes (Signed)
Breakfast Ordered 

## 2020-03-05 NOTE — Interval H&P Note (Signed)
History and Physical Interval Note:  03/05/2020 1:48 PM  Kristen Gray  has presented today for surgery, with the diagnosis of afib.  The various methods of treatment have been discussed with the patient and family. After consideration of risks, benefits and other options for treatment, the patient has consented to  Procedure(s): TRANSESOPHAGEAL ECHOCARDIOGRAM (TEE) (N/A) CARDIOVERSION (N/A) as a surgical intervention.  The patient's history has been reviewed, patient examined, no change in status, stable for surgery.  I have reviewed the patient's chart and labs.  Questions were answered to the patient's satisfaction.     [name] [employer]

## 2020-03-05 NOTE — Transfer of Care (Signed)
Immediate Anesthesia Transfer of Care Note  Patient: Kristen Gray  Procedure(s) Performed: TRANSESOPHAGEAL ECHOCARDIOGRAM (TEE) (N/A ) CARDIOVERSION (N/A )  Patient Location: Endoscopy Unit  Anesthesia Type:MAC  Level of Consciousness: drowsy and patient cooperative  Airway & Oxygen Therapy: Patient Spontanous Breathing and Patient connected to face mask oxygen  Post-op Assessment: Report given to RN and Post -op Vital signs reviewed and stable  Post vital signs: Reviewed and stable  Last Vitals:  Vitals Value Taken Time  BP 103/58 03/05/20 1434  Temp    Pulse 105 03/05/20 1434  Resp 18 03/05/20 1434  SpO2 100 % 03/05/20 1434  Vitals shown include unvalidated device data.  Last Pain:  Vitals:   03/05/20 1335  TempSrc: Temporal  PainSc: 0-No pain         Complications: No complications documented.

## 2020-03-05 NOTE — Progress Notes (Signed)
Patient in recovery, BP 86/27,  Contacted [name], per verbal order stopped diltiazem.  Can restart once BP is back in range per MD.

## 2020-03-05 NOTE — ED Notes (Signed)
Pt transported to TE cardioversion procedure via ENDO staff.

## 2020-03-05 NOTE — Progress Notes (Addendum)
Patient ID: Kristen Gray, female   DOB: 02-08-1945, 75 y.o.   MRN: [id number]     Advanced Heart Failure Rounding Note  PCP-Cardiologist: No primary care provider on file.   Subjective:    No dyspnea at rest but feels palpitations.  Remains in atrial fibrillation on diltiazem gtt at 5 mg/hr.  HR in 90s.  Creatinine lower at 1.72.   Echo: EF 50%, mild diffuse hypokinesis, normal RV, moderate MR, moderate TR, mild AI, PASP 46 with normal sized IVC.    Objective:   Weight Range:   There is no height or weight on file to calculate BMI.   Vital Signs:   Temp:  [97.4 F (36.3 C)-98.1 F (36.7 C)] 98.1 F (36.7 C) (02/10 0653) Pulse Rate:  [68-158] 96 (02/10 1106) Resp:  [18-30] 21 (02/10 1106) BP: (102-131)/(42-95) 102/50 (02/10 1106) SpO2:  [90 %-100 %] 94 % (02/10 1106)    Weight change: There were no vitals filed for this visit.  Intake/Output:  No intake or output data in the 24 hours ending 03/05/20 1146    Physical Exam    General:  Well appearing. No resp difficulty HEENT: Normal Neck: Supple. JVP 8-9 cm. Carotids 2+ bilat; no bruits. No lymphadenopathy or thyromegaly appreciated. Cor: PMI nondisplaced. Irregular rate & rhythm. No rubs, gallops or murmurs. Lungs: Clear Abdomen: Soft, nontender, nondistended. No hepatosplenomegaly. No bruits or masses. Good bowel sounds. Extremities: No cyanosis, clubbing, rash, edema Neuro: Alert & orientedx3, cranial nerves grossly intact. moves all 4 extremities w/o difficulty. Affect pleasant   Telemetry   Atrial fibrillation rate 90s (personally reviewed)   Labs    CBC Recent Labs    03/04/20 1339 03/05/20 0609  WBC 16.1* 14.4*  HGB 12.8 11.3*  HCT 40.6 34.2*  MCV 96.0 94.0  PLT 213 707   Basic Metabolic Panel Recent Labs    03/04/20 1339 03/05/20 0609  NA 141 138  K 4.5 3.5  CL 99 99  CO2 22 23  GLUCOSE 175* 81  BUN 47* 49*  CREATININE 2.02* 1.72*  CALCIUM 10.1 9.2   Liver Function Tests No  results for input(s): AST, ALT, ALKPHOS, BILITOT, PROT, ALBUMIN in the last 72 hours. No results for input(s): LIPASE, AMYLASE in the last 72 hours. Cardiac Enzymes No results for input(s): CKTOTAL, CKMB, CKMBINDEX, TROPONINI in the last 72 hours.  BNP: BNP (last 3 results) Recent Labs    03/04/20 1801  BNP 877.8*    ProBNP (last 3 results) No results for input(s): PROBNP in the last 8760 hours.   D-Dimer No results for input(s): DDIMER in the last 72 hours. Hemoglobin A1C No results for input(s): HGBA1C in the last 72 hours. Fasting Lipid Panel No results for input(s): CHOL, HDL, LDLCALC, TRIG, CHOLHDL, LDLDIRECT in the last 72 hours. Thyroid Function Tests Recent Labs    03/05/20 0609  TSH 1.886    Other results:   Imaging    DG Chest Port 1 View  Result Date: 03/04/2020 CLINICAL DATA:  Weakness EXAM: PORTABLE CHEST 1 VIEW COMPARISON:  CT 10/12/2016, radiograph 08/04/2016 FINDINGS: Lobular contour deformity of the right hemidiaphragm compatible with a chronic right diaphragmatic eventration seen on comparison imaging. Some mild chronically coarsened interstitial and bronchitic features are quite similar to comparison imaging. No new consolidative process. Normal distribution of the pulmonary vascularity without convincing features of edema. Enlarged cardiac silhouette compatible cardiomegaly seen on prior studies. The aorta is calcified. The remaining cardiomediastinal contours are unremarkable. No acute osseous  or soft tissue abnormality. Degenerative changes are present in the imaged spine and shoulders. Telemetry leads overlie the chest. IMPRESSION: 1. No acute cardiopulmonary disease. 2. Chronically coarsened interstitial and bronchitic changes are similar to priors. 3. Chronic right diaphragmatic eventration. 4. Cardiomegaly without convincing features of edema. Electronically Signed   By: Lovena Le M.D.   On: 03/04/2020 18:27   ECHOCARDIOGRAM COMPLETE  Result Date:  03/05/2020    ECHOCARDIOGRAM REPORT   Patient Name:   Kristen Gray Date of Exam: 03/05/2020 Medical Rec #:  [id number]      Height:       58.0 in Accession #:    [id number]     Weight:       157.6 lb Date of Birth:  02-19-1945      BSA:          1.646 m Patient Age:    31 years       BP:           118/73 mmHg Patient Gender: F              HR:           107 bpm. Exam Location:  Inpatient Procedure: 2D Echo, Color Doppler and Cardiac Doppler Indications:    I48.91* Unspeicified atrial fibrillation  History:        Patient has prior history of Echocardiogram examinations, most                 recent 10/12/2016. Risk Factors:Hypertension and Diabetes.  Sonographer:    Bernadene Person RDCS Referring Phys: [id number] [place]  1. Left ventricular ejection fraction, by estimation, is 50%. The left ventricle has mildly decreased function. The left ventricle demonstrates global hypokinesis. Left ventricular diastolic parameters are indeterminate.  2. Right ventricular systolic function is normal. The right ventricular size is normal. There is moderately elevated pulmonary artery systolic pressure. The estimated right ventricular systolic pressure is 65.0 mmHg.  3. Left atrial size was mildly dilated.  4. A small pericardial effusion is present.  5. The mitral valve is normal in structure. Moderate mitral valve regurgitation. No evidence of mitral stenosis.  6. Tricuspid valve regurgitation is moderate.  7. The aortic valve is tricuspid. Aortic valve regurgitation is mild. No aortic stenosis is present.  8. The inferior vena cava is normal in size with greater than 50% respiratory variability, suggesting right atrial pressure of 3 mmHg.  9. The patient was in atrial fibrillation. FINDINGS  Left Ventricle: Left ventricular ejection fraction, by estimation, is 50%. The left ventricle has mildly decreased function. The left ventricle demonstrates global hypokinesis. The left ventricular internal cavity size was  normal in size. There is no left ventricular hypertrophy. Left ventricular diastolic parameters are indeterminate. Right Ventricle: The right ventricular size is normal. No increase in right ventricular wall thickness. Right ventricular systolic function is normal. There is moderately elevated pulmonary artery systolic pressure. The tricuspid regurgitant velocity is 3.29 m/s, and with an assumed right atrial pressure of 3 mmHg, the estimated right ventricular systolic pressure is 35.4 mmHg. Left Atrium: Left atrial size was mildly dilated. Right Atrium: Right atrial size was normal in size. Pericardium: A small pericardial effusion is present. Mitral Valve: The mitral valve is normal in structure. Moderate mitral valve regurgitation. No evidence of mitral valve stenosis. Tricuspid Valve: The tricuspid valve is normal in structure. Tricuspid valve regurgitation is moderate. Aortic Valve: The aortic valve is tricuspid. Aortic valve regurgitation is mild. Aortic  regurgitation PHT measures 380 msec. No aortic stenosis is present. Pulmonic Valve: The pulmonic valve was normal in structure. Pulmonic valve regurgitation is not visualized. Aorta: The aortic root is normal in size and structure. Venous: The inferior vena cava is normal in size with greater than 50% respiratory variability, suggesting right atrial pressure of 3 mmHg. IAS/Shunts: No atrial level shunt detected by color flow Doppler.  LEFT VENTRICLE PLAX 2D LVIDd:         5.20 cm LVIDs:         3.60 cm LV PW:         0.60 cm LV IVS:        0.60 cm LVOT diam:     1.70 cm LV SV:         33 LV SV Index:   20 LVOT Area:     2.27 cm  RIGHT VENTRICLE TAPSE (M-mode): 1.6 cm LEFT ATRIUM             Index       RIGHT ATRIUM           Index LA diam:        4.60 cm 2.80 cm/m  RA Area:     14.50 cm LA Vol (A2C):   58.7 ml 35.67 ml/m RA Volume:   35.90 ml  21.81 ml/m LA Vol (A4C):   57.7 ml 35.06 ml/m LA Biplane Vol: 58.5 ml 35.55 ml/m  AORTIC VALVE LVOT Vmax:    72.23 cm/s LVOT Vmean:  52.067 cm/s LVOT VTI:    0.144 m AI PHT:      380 msec  AORTA Ao Root diam: 2.40 cm Ao Asc diam:  2.40 cm MR Peak grad:    97.6 mmHg   TRICUSPID VALVE MR Mean grad:    63.0 mmHg   TR Peak grad:   43.3 mmHg MR Vmax:         494.00 cm/s TR Vmax:        329.00 cm/s MR Vmean:        373.0 cm/s MR PISA:         1.01 cm    SHUNTS MR PISA Eff ROA: 6 mm       Systemic VTI:  0.14 m MR PISA Radius:  0.40 cm     Systemic Diam: 1.70 cm [name] MD Electronically signed by [name] MD Signature Date/Time: 03/05/2020/10:46:06 AM    Final       Medications:     Scheduled Medications: . apixaban  5 mg Oral BID  . balsalazide  1,500 mg Oral BID WC  . diltiazem  30 mg Oral Q6H  . pantoprazole  40 mg Oral Daily  . venlafaxine XR  150 mg Oral Q breakfast     Infusions: . diltiazem (CARDIZEM) infusion 5 mg/hr (03/04/20 1800)     PRN Medications:  acetaminophen, albuterol, ondansetron (ZOFRAN) IV, traZODone    Assessment/Plan   1. Atrial fibrillation: New diagnosis.  By symptoms, suspect Kristen Gray has been in it for at least 2 wks (palpitations, increased dyspnea).  Rate controlled on diltiazem gtt.  Echo today with EF 50%, mild diffuse hypokinesis, normal RV, moderate MR, moderate TR, mild AI, PASP 46 with normal sized IVC.  CHADSVASC 3.  Kristen Gray has started on apixaban, had 5 mg this morning. Will need conversion to NSR given significant symptoms in atrial fibrillation.  - Will give her another 5 mg apixaban for 10 mg (loading dose per EMANATE study).  -  Continue diltiazem gtt.  - TEE-guided DCCV later today. Discussed risks/benefits and Kristen Gray agrees to procedure.  2. Acute diastolic CHF: Echo as above.  Kristen Gray has JVD but IVC small on echo.  With elevated creatinine, will hold off on diuresis and aim for cardioversion.  3. ?AKI on CKD stage 3: Unsure of baseline creatinine, was up to 2 at admission, now 1.7.  Follow closely.  4. Chronic dyspnea: Kristen Gray had an extensive workup with  me in 2018 including unremarkable cath and high resolution CT chest.  5. Thyroid: Normal TSH but elevated free T4.  Repeat TSH, free T3, and free T4 in am.   Length of Stay: 0  [name], MD  03/05/2020, 11:46 AM  Advanced Heart Failure Team Pager [phone] (M-F; [place])  Please contact [facility] for night-coverage after hours (4p -7a ) and weekends on [email or web address]

## 2020-03-05 NOTE — Progress Notes (Signed)
  Echocardiogram 2D Echocardiogram has been performed.  Fidel Levy 03/05/2020, 9:55 AM

## 2020-03-05 NOTE — Progress Notes (Signed)
  Echocardiogram Echocardiogram Transesophageal has been performed.  Randa Lynn [place] 03/05/2020, 3:05 PM

## 2020-03-05 NOTE — ED Notes (Signed)
Pt's breakfast tray not given, made NPO to have TE cardioversion.

## 2020-03-06 LAB — BASIC METABOLIC PANEL
Anion gap: 13 (ref 5–15)
BUN: 42 mg/dL — ABNORMAL HIGH (ref 8–23)
CO2: 26 mmol/L (ref 22–32)
Calcium: 8.9 mg/dL (ref 8.9–10.3)
Chloride: 96 mmol/L — ABNORMAL LOW (ref 98–111)
Creatinine, Ser: 1.55 mg/dL — ABNORMAL HIGH (ref 0.44–1.00)
GFR, Estimated: 35 mL/min — ABNORMAL LOW (ref >60)
Glucose, Bld: 144 mg/dL — ABNORMAL HIGH (ref 70–99)
Potassium: 3.1 mmol/L — ABNORMAL LOW (ref 3.5–5.1)
Sodium: 135 mmol/L (ref 135–145)

## 2020-03-06 LAB — TSH: TSH: 1.304 u[IU]/mL (ref 0.350–4.500)

## 2020-03-06 LAB — CBC
HCT: 34.5 % — ABNORMAL LOW (ref 36.0–46.0)
Hemoglobin: 11.4 g/dL — ABNORMAL LOW (ref 12.0–15.0)
MCH: 30.7 pg (ref 26.0–34.0)
MCHC: 33 g/dL (ref 30.0–36.0)
MCV: 93 fL (ref 80.0–100.0)
Platelets: 196 10*3/uL (ref 150–400)
RBC: 3.71 MIL/uL — ABNORMAL LOW (ref 3.87–5.11)
RDW: 14.3 % (ref 11.5–15.5)
WBC: 11.1 10*3/uL — ABNORMAL HIGH (ref 4.0–10.5)
nRBC: 0.2 % (ref 0.0–0.2)

## 2020-03-06 LAB — PROTIME-INR
INR: 1.7 — ABNORMAL HIGH (ref 0.8–1.2)
Prothrombin Time: 19.4 seconds — ABNORMAL HIGH (ref 11.4–15.2)

## 2020-03-06 LAB — T4, FREE: Free T4: 1.87 ng/dL — ABNORMAL HIGH (ref 0.61–1.12)

## 2020-03-06 MED ORDER — ADULT MULTIVITAMIN W/MINERALS CH
1.0000 | ORAL_TABLET | Freq: Every day | ORAL | Status: DC
  Administered 2020-03-06 – 2020-03-09 (×4): 1 via ORAL
  Filled 2020-03-06 (×4): qty 1

## 2020-03-06 MED ORDER — METOPROLOL TARTRATE 5 MG/5ML IV SOLN
2.5000 mg | INTRAVENOUS | Status: DC | PRN
  Administered 2020-03-06 – 2020-03-07 (×3): 2.5 mg via INTRAVENOUS
  Filled 2020-03-06 (×3): qty 5

## 2020-03-06 MED ORDER — POTASSIUM CHLORIDE CRYS ER 20 MEQ PO TBCR
40.0000 meq | EXTENDED_RELEASE_TABLET | Freq: Once | ORAL | Status: AC
  Administered 2020-03-06: 40 meq via ORAL
  Filled 2020-03-06: qty 2

## 2020-03-06 MED ORDER — SIMETHICONE 40 MG/0.6ML PO SUSP
40.0000 mg | Freq: Four times a day (QID) | ORAL | Status: DC | PRN
  Filled 2020-03-06: qty 0.6

## 2020-03-06 MED ORDER — LOPERAMIDE HCL 2 MG PO CAPS
2.0000 mg | ORAL_CAPSULE | ORAL | Status: DC | PRN
  Administered 2020-03-06: 2 mg via ORAL
  Filled 2020-03-06: qty 1

## 2020-03-06 MED ORDER — METOPROLOL SUCCINATE ER 25 MG PO TB24
25.0000 mg | ORAL_TABLET | Freq: Two times a day (BID) | ORAL | Status: DC
Start: 2020-03-06 — End: 2020-03-07
  Administered 2020-03-06 – 2020-03-07 (×3): 25 mg via ORAL
  Filled 2020-03-06 (×3): qty 1

## 2020-03-06 MED ORDER — FLECAINIDE ACETATE 50 MG PO TABS
100.0000 mg | ORAL_TABLET | Freq: Two times a day (BID) | ORAL | Status: DC
  Administered 2020-03-06 – 2020-03-09 (×8): 100 mg via ORAL
  Filled 2020-03-06 (×8): qty 2

## 2020-03-06 NOTE — Consult Note (Addendum)
ELECTROPHYSIOLOGY CONSULT NOTE    Patient ID: Kristen Gray MRN: [id number], DOB/AGE: 1945/04/13 75 y.o.  Admit date: 03/04/2020 Date of Consult: 03/06/2020  Primary Physician: [place], Betty G, MD Primary Cardiologist: No primary care provider on file.  Electrophysiologist: New  Referring Provider: [name]  Patient Profile: Kristen Gray is a 75 y.o. female with a history of recently diagnosed atrial fibrillation, Crohn's disease, HTN, anxiety, Chronic diastolic CHF, CKD III, Chronic dyspnea of unclear etiology who is being seen today for the evaluation of atrial fibrillation at the request of [name].  HPI:  [name] is a 75 y.o. female with medical history as above.   Pt admitted 03/04/2020 with 2 days of severe dyspnea, tachy-palpitations, chest discomfort, and nausea that started after an issue arose with her apartment. On presentation to the ED she was found to be in rapid atrial fibrillation, which was a new diagnosis. As she was highly symptomatic with > 48 hrs of symptoms, she was planned for TEE/DCC. Started on Eliquis 5 mg BID. Echo showed LVEF 50%.   Pt underwent TEE/DCC yesterday with return to NSR. Unfortunately, she continued to have very frequent atrial ectopy, and reverted back to AF.    She is noted to have Moderate LAE by TEE. (Measurements on TTE LA diam 4.6 cm, LA Vol (A4C) 57.7 ml)  EP asked to see for recommendations regarding antiarrhythmic therapy.   Currently she is anxious about her situation. She denies SOB at rest. She repeats the history of her presentation as above.   Past Medical History:  Diagnosis Date  . Anemia   . Anxiety   . Arthritis   . Crohn's disease ([place])   . Depression   . GERD (gastroesophageal reflux disease)   . Heart murmur   . Hypertension   . Reflux      Surgical History:  Past Surgical History:  Procedure Laterality Date  . ANKLE SURGERY    . CARPAL TUNNEL RELEASE    . CHOLECYSTECTOMY    . COLON SURGERY     . RIGHT/LEFT HEART CATH AND CORONARY ANGIOGRAPHY N/A 09/15/2016   Procedure: RIGHT/LEFT HEART CATH AND CORONARY ANGIOGRAPHY;  Surgeon: Larey Dresser, MD;  Location: [facility];  Service: Cardiovascular;  Laterality: N/A;     Medications Prior to Admission  Medication Sig Dispense Refill Last Dose  . acetaminophen (TYLENOL) 500 MG tablet Take 1,000 mg by mouth every 6 (six) hours as needed (FOR PAIN.).   unk  . albuterol (PROAIR HFA) 108 (90 Base) MCG/ACT inhaler Inhale 2 puffs into the lungs every 6 (six) hours as needed for wheezing or shortness of breath. 18 g 2 03/03/2020  . atenolol-chlorthalidone (TENORETIC) 50-25 MG tablet TAKE 1 TABLET BY MOUTH EVERY DAY (Patient taking differently: Take 1 tablet by mouth daily.) 90 tablet 1 03/04/2020 at 1400  . balsalazide (COLAZAL) 750 MG capsule Take 2 capsules (1,500 mg total) by mouth 2 (two) times daily. 120 capsule 11 03/04/2020 at Unknown time  . Calcium Carbonate-Vitamin D (CALTRATE 600+D PO) Take 2 tablets by mouth daily.   03/03/2020  . Cholecalciferol (VITAMIN D3) 50 MCG (2000 UT) capsule TAKE 1 CAPSULE BY MOUTH EVERY DAY (Patient taking differently: Take 2,000 Units by mouth daily.) 30 capsule 3 03/04/2020 at Unknown time  . omeprazole (PRILOSEC) 20 MG capsule Take 1 capsule (20 mg total) by mouth daily before breakfast. 90 capsule 0 03/04/2020 at Unknown time  . Propylene Glycol (SYSTANE BALANCE) 0.6 %  SOLN Place 1 drop into both eyes in the morning and at bedtime.   03/04/2020 at Unknown time  . traZODone (DESYREL) 100 MG tablet TAKE 1/2 TABLET BY MOUTH NIGHTLY AT BEDTIME (Patient taking differently: Take 50 mg by mouth at bedtime. TAKE 1/2 TABLET BY MOUTH NIGHTLY AT BEDTIME) 45 tablet 1 03/03/2020  . venlafaxine XR (EFFEXOR-XR) 75 MG 24 hr capsule Take 2 capsules (150 mg total) by mouth daily with breakfast. 60 capsule 3 03/04/2020 at Unknown time  . vitamin B-12 (CYANOCOBALAMIN) 1000 MCG tablet Take 1 tablet (1,000 mcg total) by mouth daily. 30  tablet 3 03/04/2020 at Unknown time  . folic acid (FOLVITE) 1 MG tablet TAKE 1 TABLET BY MOUTH EVERY DAY (Patient not taking: No sig reported) 30 tablet 3 Not Taking at Unknown time    Inpatient Medications:  . apixaban  5 mg Oral BID  . balsalazide  1,500 mg Oral BID WC  . diltiazem  30 mg Oral Q6H  . feeding supplement  237 mL Oral BID BM  . metoprolol succinate  25 mg Oral Daily  . pantoprazole  40 mg Oral Daily  . venlafaxine XR  150 mg Oral Q breakfast    Allergies:  Allergies  Allergen Reactions  . Penicillin G Nausea Only    Has patient had a PCN reaction causing immediate rash, facial/tongue/throat swelling, SOB or lightheadedness with hypotension:No Has patient had a PCN reaction causing severe rash involving mucus membranes or skin necrosis: No Has patient had a PCN reaction that required hospitalization: No Has patient had a PCN reaction occurring within the last 10 years: No--NAUSEA ONLY If all of the above answers are "NO", then may proceed with Cephalosporin use.     Social History   Socioeconomic History  . Marital status: Widowed    Spouse name: Not on file  . Number of children: Not on file  . Years of education: Not on file  . Highest education level: Not on file  Occupational History  . Occupation: retired  Tobacco Use  . Smoking status: Never Smoker  . Smokeless tobacco: Never Used  Vaping Use  . Vaping Use: Never used  Substance and Sexual Activity  . Alcohol use: No  . Drug use: No  . Sexual activity: Not Currently  Other Topics Concern  . Not on file  Social History Narrative  . Not on file   Social Determinants of Health   Financial Resource Strain: Not on file  Food Insecurity: Not on file  Transportation Needs: Not on file  Physical Activity: Not on file  Stress: Not on file  Social Connections: Not on file  Intimate Partner Violence: Not on file     Family History  Problem Relation Age of Onset  . Heart disease Mother        RF   . Stroke Father   . Diabetes Father   . Hyperlipidemia Father   . Hypertension Father      Review of Systems: All other systems reviewed and are otherwise negative except as noted above.  Physical Exam: Vitals:   03/05/20 2328 03/06/20 0408 03/06/20 0449 03/06/20 0510  BP:  96/60 100/61 (!) 103/52  Pulse: 86     Resp:  20    Temp:  97.9 F (36.6 C)    TempSrc:  Oral    SpO2: 98% 98%    Weight:  70.4 kg    Height:        GEN- The patient is elderly  appearing, alert and oriented x 3 today.   HEENT: normocephalic, atraumatic; sclera clear, conjunctiva pink; hearing intact; oropharynx clear; neck supple Lungs- Clear to ausculation bilaterally, normal work of breathing.  No wheezes, rales, rhonchi Heart- Irregular rate and rhythm, no murmurs, rubs or gallops GI- soft, non-tender, non-distended, bowel sounds present Extremities- no clubbing, cyanosis, or edema; DP/PT/radial pulses 2+ bilaterally MS- no significant deformity or atrophy Skin- warm and dry, no rash or lesion Psych- anxious mood, full affect Neuro- strength and sensation are intact  Labs:   Lab Results  Component Value Date   WBC 11.1 (H) 03/06/2020   HGB 11.4 (L) 03/06/2020   HCT 34.5 (L) 03/06/2020   MCV 93.0 03/06/2020   PLT 196 03/06/2020    Recent Labs  Lab 03/06/20 0041  NA 135  K 3.1*  CL 96*  CO2 26  BUN 42*  CREATININE 1.55*  CALCIUM 8.9  GLUCOSE 144*      Radiology/Studies: DG Chest Port 1 View  Result Date: 03/04/2020 CLINICAL DATA:  Weakness EXAM: PORTABLE CHEST 1 VIEW COMPARISON:  CT 10/12/2016, radiograph 08/04/2016 FINDINGS: Lobular contour deformity of the right hemidiaphragm compatible with a chronic right diaphragmatic eventration seen on comparison imaging. Some mild chronically coarsened interstitial and bronchitic features are quite similar to comparison imaging. No new consolidative process. Normal distribution of the pulmonary vascularity without convincing features of edema.  Enlarged cardiac silhouette compatible cardiomegaly seen on prior studies. The aorta is calcified. The remaining cardiomediastinal contours are unremarkable. No acute osseous or soft tissue abnormality. Degenerative changes are present in the imaged spine and shoulders. Telemetry leads overlie the chest. IMPRESSION: 1. No acute cardiopulmonary disease. 2. Chronically coarsened interstitial and bronchitic changes are similar to priors. 3. Chronic right diaphragmatic eventration. 4. Cardiomegaly without convincing features of edema. Electronically Signed   By: Lovena Le M.D.   On: 03/04/2020 18:27   ECHOCARDIOGRAM COMPLETE  Result Date: 03/05/2020    ECHOCARDIOGRAM REPORT   Patient Name:   PATRINA ANN [facility] Date of Exam: 03/05/2020 Medical Rec #:  [id number]      Height:       58.0 in Accession #:    [id number]     Weight:       157.6 lb Date of Birth:  21-Sep-1945      BSA:          1.646 m Patient Age:    65 years       BP:           118/73 mmHg Patient Gender: F              HR:           107 bpm. Exam Location:  Inpatient Procedure: 2D Echo, Color Doppler and Cardiac Doppler Indications:    I48.91* Unspeicified atrial fibrillation  History:        Patient has prior history of Echocardiogram examinations, most                 recent 10/12/2016. Risk Factors:Hypertension and Diabetes.  Sonographer:    Bernadene Person RDCS Referring Phys: [id number] [place]  1. Left ventricular ejection fraction, by estimation, is 50%. The left ventricle has mildly decreased function. The left ventricle demonstrates global hypokinesis. Left ventricular diastolic parameters are indeterminate.  2. Right ventricular systolic function is normal. The right ventricular size is normal. There is moderately elevated pulmonary artery systolic pressure. The estimated right ventricular systolic pressure is 00.9 mmHg.  3. Left  atrial size was mildly dilated.  4. A small pericardial effusion is present.  5. The mitral valve is normal  in structure. Moderate mitral valve regurgitation. No evidence of mitral stenosis.  6. Tricuspid valve regurgitation is moderate.  7. The aortic valve is tricuspid. Aortic valve regurgitation is mild. No aortic stenosis is present.  8. The inferior vena cava is normal in size with greater than 50% respiratory variability, suggesting right atrial pressure of 3 mmHg.  9. The patient was in atrial fibrillation. FINDINGS  Left Ventricle: Left ventricular ejection fraction, by estimation, is 50%. The left ventricle has mildly decreased function. The left ventricle demonstrates global hypokinesis. The left ventricular internal cavity size was normal in size. There is no left ventricular hypertrophy. Left ventricular diastolic parameters are indeterminate. Right Ventricle: The right ventricular size is normal. No increase in right ventricular wall thickness. Right ventricular systolic function is normal. There is moderately elevated pulmonary artery systolic pressure. The tricuspid regurgitant velocity is 3.29 m/s, and with an assumed right atrial pressure of 3 mmHg, the estimated right ventricular systolic pressure is 86.7 mmHg. Left Atrium: Left atrial size was mildly dilated. Right Atrium: Right atrial size was normal in size. Pericardium: A small pericardial effusion is present. Mitral Valve: The mitral valve is normal in structure. Moderate mitral valve regurgitation. No evidence of mitral valve stenosis. Tricuspid Valve: The tricuspid valve is normal in structure. Tricuspid valve regurgitation is moderate. Aortic Valve: The aortic valve is tricuspid. Aortic valve regurgitation is mild. Aortic regurgitation PHT measures 380 msec. No aortic stenosis is present. Pulmonic Valve: The pulmonic valve was normal in structure. Pulmonic valve regurgitation is not visualized. Aorta: The aortic root is normal in size and structure. Venous: The inferior vena cava is normal in size with greater than 50% respiratory variability,  suggesting right atrial pressure of 3 mmHg. IAS/Shunts: No atrial level shunt detected by color flow Doppler.  LEFT VENTRICLE PLAX 2D LVIDd:         5.20 cm LVIDs:         3.60 cm LV PW:         0.60 cm LV IVS:        0.60 cm LVOT diam:     1.70 cm LV SV:         33 LV SV Index:   20 LVOT Area:     2.27 cm  RIGHT VENTRICLE TAPSE (M-mode): 1.6 cm LEFT ATRIUM             Index       RIGHT ATRIUM           Index LA diam:        4.60 cm 2.80 cm/m  RA Area:     14.50 cm LA Vol (A2C):   58.7 ml 35.67 ml/m RA Volume:   35.90 ml  21.81 ml/m LA Vol (A4C):   57.7 ml 35.06 ml/m LA Biplane Vol: 58.5 ml 35.55 ml/m  AORTIC VALVE LVOT Vmax:   72.23 cm/s LVOT Vmean:  52.067 cm/s LVOT VTI:    0.144 m AI PHT:      380 msec  AORTA Ao Root diam: 2.40 cm Ao Asc diam:  2.40 cm MR Peak grad:    97.6 mmHg   TRICUSPID VALVE MR Mean grad:    63.0 mmHg   TR Peak grad:   43.3 mmHg MR Vmax:         494.00 cm/s TR Vmax:  329.00 cm/s MR Vmean:        373.0 cm/s MR PISA:         1.01 cm    SHUNTS MR PISA Eff ROA: 6 mm       Systemic VTI:  0.14 m MR PISA Radius:  0.40 cm     Systemic Diam: 1.70 cm [name] MD Electronically signed by [name] MD Signature Date/Time: 03/05/2020/10:46:06 AM    Final     EKG: on arrival to ED showed AF with RVR in 130s (personally reviewed) EKG 03/05/20 shows Sinus tach with PACs and falsely elevated QTc due to ectopy.  Baseline EKG 09/16/2019 shows NSR at 68 with PACs and QTc ~430  TELEMETRY: Post TEE/DCC showed NSR with very frequent PACs reverted to AF after several hours rates 90-120s (personally reviewed)  Assessment/Plan: 1.  Newly diagnosed atrial fibrillation She is very symptomatic with decreased exercise tolerance, and SOB/chest pressure when rates are high. She has been started on Eliquis for CHA2DS2VASC of at least 4.    TEE/DCC 03/05/20 with ERAF Regarding AAD: Baseline QTc 430-440.  Current CrCl <61m ml/min which only qualifies for lowest dose of tikosyn.  AKI  noted, but recent baseline 1.31 is CrCl of ~40 (borderline still for lowest dose of tikosyn) She does not have known CAD, so flecainide may be considered. She has unclear thyroid issues (TSH normal but Free T4 currently elevated) and chronic dyspnea (High res chest CT 2018 with no evidence of ILD. [place]8/2018 with borderline Pulm HTN)  2. Acute diastolic CHF:  Echo as above.  Per CHF team.   3. ?AKI on CKD stage 3:  Unsure of baseline creatinine, was up to 2 at admission, now 1.5.  Follow closely.  As above this limits her consideration for tikosyn.  4. Chronic dyspnea:  She had an extensive workup with Dr. MAundra Dubinin 2018 including unremarkable cath and high resolution CT chest. Mild Pulmonary HTN and no evidence of ILD  5. Thyroid:  Normal TSH but elevated free T4, pending T3.   Per primary.   6. Hypokalemia K 3.1 today with diuresis.  Supp with goal to keep K > 4.0 and Mg > 2.0   Will discuss optimal AAD with Dr. ARayann Heman She is borderline candidate for tikosyn given her renal function (would start at middle dose at best). Suspect flecainide or amiodarone are her best options.   ADDENDUM Dr. ARayann Hemanhas seen. Will plan to start flecainide 100 mg BID. Will schedule AF clinic appointment for 10 day EKG and continued follow up and continuing AF education.  For questions or updates, please contact [email or web address] consult [email or web address] for contact info under Cardiology/STEMI.  Signed, MShirley Friar PA-C  03/06/2020 8:37 AM    I have seen, examined the patient, and reviewed the above assessment and plan.  Changes to above are made where necessary.  On exam, IRRR.  We will start flecainide and continue observation here.  Given renal failure, her dose for TPhyllis Gingerwould be low.  We will also reserve amiodarone as a second line agent.  Co Sign: JThompson Grayer MD 03/06/2020 1:44 PM

## 2020-03-06 NOTE — Progress Notes (Addendum)
Patient ID: Kristen Gray, female   DOB: 28-Dec-1945, 75 y.o.   MRN: [id number]     Advanced Heart Failure Rounding Note  PCP-Cardiologist: No primary care provider on file.   Subjective:    Still feels "weak as water."  TEE-DCCV yesterday to NSR with frequent PACs, then went back into atrial fibrillation overnight, rate 100s.   Echo: EF 50%, mild diffuse hypokinesis, normal RV, moderate MR, moderate TR, mild AI, PASP 46 with normal sized IVC.  TEE: EF 50-55%, normal RV, moderate TR, moderate central MR, mild AI  Objective:   Weight Range: 70.4 kg Body mass index is 32.44 kg/m.   Vital Signs:   Temp:  [97 F (36.1 C)-98.1 F (36.7 C)] 97.9 F (36.6 C) (02/11 0408) Pulse Rate:  [84-185] 86 (02/10 2328) Resp:  [18-29] 20 (02/11 0408) BP: (90-122)/(32-83) 103/52 (02/11 0510) SpO2:  [94 %-100 %] 98 % (02/11 0408) Weight:  [67.6 kg-70.4 kg] 70.4 kg (02/11 0408) Last BM Date: 03/05/20  Weight change: Filed Weights   03/05/20 1335 03/05/20 1610 03/06/20 0408  Weight: 68 kg 67.6 kg 70.4 kg    Intake/Output:   Intake/Output Summary (Last 24 hours) at 03/06/2020 0838 Last data filed at 03/05/2020 1457 Gross per 24 hour  Intake 506.36 ml  Output -  Net 506.36 ml      Physical Exam    General: NAD Neck: JVP 8 cm, no thyromegaly or thyroid nodule.  Lungs: Clear to auscultation bilaterally with normal respiratory effort. CV: Nondisplaced PMI.  Heart mildly tachy, irregular S1/S2, no S3/S4, 2/6 HSM LLSB/apex.  No peripheral edema.   Abdomen: Soft, nontender, no hepatosplenomegaly, no distention.  Skin: Intact without lesions or rashes.  Neurologic: Alert and oriented x 3.  Psych: Normal affect. Extremities: No clubbing or cyanosis.  HEENT: Normal.    Telemetry   Atrial fibrillation rate 90s-100s (personally reviewed)   Labs    CBC Recent Labs    03/05/20 0609 03/06/20 0041  WBC 14.4* 11.1*  HGB 11.3* 11.4*  HCT 34.2* 34.5*  MCV 94.0 93.0  PLT 191 841    Basic Metabolic Panel Recent Labs    03/05/20 0609 03/06/20 0041  NA 138 135  K 3.5 3.1*  CL 99 96*  CO2 23 26  GLUCOSE 81 144*  BUN 49* 42*  CREATININE 1.72* 1.55*  CALCIUM 9.2 8.9   Liver Function Tests No results for input(s): AST, ALT, ALKPHOS, BILITOT, PROT, ALBUMIN in the last 72 hours. No results for input(s): LIPASE, AMYLASE in the last 72 hours. Cardiac Enzymes No results for input(s): CKTOTAL, CKMB, CKMBINDEX, TROPONINI in the last 72 hours.  BNP: BNP (last 3 results) Recent Labs    03/04/20 1801  BNP 877.8*    ProBNP (last 3 results) No results for input(s): PROBNP in the last 8760 hours.   D-Dimer No results for input(s): DDIMER in the last 72 hours. Hemoglobin A1C No results for input(s): HGBA1C in the last 72 hours. Fasting Lipid Panel No results for input(s): CHOL, HDL, LDLCALC, TRIG, CHOLHDL, LDLDIRECT in the last 72 hours. Thyroid Function Tests Recent Labs    03/06/20 0041  TSH 1.304    Other results:   Imaging    ECHOCARDIOGRAM COMPLETE  Result Date: 03/05/2020    ECHOCARDIOGRAM REPORT   Patient Name:   Kristen Gray Date of Exam: 03/05/2020 Medical Rec #:  [id number]      Height:       58.0 in Accession #:  [id number]     Weight:       157.6 lb Date of Birth:  1945/08/01      BSA:          1.646 m Patient Age:    59 years       BP:           118/73 mmHg Patient Gender: F              HR:           107 bpm. Exam Location:  Inpatient Procedure: 2D Echo, Color Doppler and Cardiac Doppler Indications:    I48.91* Unspeicified atrial fibrillation  History:        Patient has prior history of Echocardiogram examinations, most                 recent 10/12/2016. Risk Factors:Hypertension and Diabetes.  Sonographer:    Bernadene Person RDCS Referring Phys: [id number] [place]  1. Left ventricular ejection fraction, by estimation, is 50%. The left ventricle has mildly decreased function. The left ventricle demonstrates global hypokinesis.  Left ventricular diastolic parameters are indeterminate.  2. Right ventricular systolic function is normal. The right ventricular size is normal. There is moderately elevated pulmonary artery systolic pressure. The estimated right ventricular systolic pressure is 53.2 mmHg.  3. Left atrial size was mildly dilated.  4. A small pericardial effusion is present.  5. The mitral valve is normal in structure. Moderate mitral valve regurgitation. No evidence of mitral stenosis.  6. Tricuspid valve regurgitation is moderate.  7. The aortic valve is tricuspid. Aortic valve regurgitation is mild. No aortic stenosis is present.  8. The inferior vena cava is normal in size with greater than 50% respiratory variability, suggesting right atrial pressure of 3 mmHg.  9. The patient was in atrial fibrillation. FINDINGS  Left Ventricle: Left ventricular ejection fraction, by estimation, is 50%. The left ventricle has mildly decreased function. The left ventricle demonstrates global hypokinesis. The left ventricular internal cavity size was normal in size. There is no left ventricular hypertrophy. Left ventricular diastolic parameters are indeterminate. Right Ventricle: The right ventricular size is normal. No increase in right ventricular wall thickness. Right ventricular systolic function is normal. There is moderately elevated pulmonary artery systolic pressure. The tricuspid regurgitant velocity is 3.29 m/s, and with an assumed right atrial pressure of 3 mmHg, the estimated right ventricular systolic pressure is 99.2 mmHg. Left Atrium: Left atrial size was mildly dilated. Right Atrium: Right atrial size was normal in size. Pericardium: A small pericardial effusion is present. Mitral Valve: The mitral valve is normal in structure. Moderate mitral valve regurgitation. No evidence of mitral valve stenosis. Tricuspid Valve: The tricuspid valve is normal in structure. Tricuspid valve regurgitation is moderate. Aortic Valve: The aortic  valve is tricuspid. Aortic valve regurgitation is mild. Aortic regurgitation PHT measures 380 msec. No aortic stenosis is present. Pulmonic Valve: The pulmonic valve was normal in structure. Pulmonic valve regurgitation is not visualized. Aorta: The aortic root is normal in size and structure. Venous: The inferior vena cava is normal in size with greater than 50% respiratory variability, suggesting right atrial pressure of 3 mmHg. IAS/Shunts: No atrial level shunt detected by color flow Doppler.  LEFT VENTRICLE PLAX 2D LVIDd:         5.20 cm LVIDs:         3.60 cm LV PW:         0.60 cm LV IVS:  0.60 cm LVOT diam:     1.70 cm LV SV:         33 LV SV Index:   20 LVOT Area:     2.27 cm  RIGHT VENTRICLE TAPSE (M-mode): 1.6 cm LEFT ATRIUM             Index       RIGHT ATRIUM           Index LA diam:        4.60 cm 2.80 cm/m  RA Area:     14.50 cm LA Vol (A2C):   58.7 ml 35.67 ml/m RA Volume:   35.90 ml  21.81 ml/m LA Vol (A4C):   57.7 ml 35.06 ml/m LA Biplane Vol: 58.5 ml 35.55 ml/m  AORTIC VALVE LVOT Vmax:   72.23 cm/s LVOT Vmean:  52.067 cm/s LVOT VTI:    0.144 m AI PHT:      380 msec  AORTA Ao Root diam: 2.40 cm Ao Asc diam:  2.40 cm MR Peak grad:    97.6 mmHg   TRICUSPID VALVE MR Mean grad:    63.0 mmHg   TR Peak grad:   43.3 mmHg MR Vmax:         494.00 cm/s TR Vmax:        329.00 cm/s MR Vmean:        373.0 cm/s MR PISA:         1.01 cm    SHUNTS MR PISA Eff ROA: 6 mm       Systemic VTI:  0.14 m MR PISA Radius:  0.40 cm     Systemic Diam: 1.70 cm [name] MD Electronically signed by [name] MD Signature Date/Time: 03/05/2020/10:46:06 AM    Final      Medications:     Scheduled Medications: . apixaban  5 mg Oral BID  . balsalazide  1,500 mg Oral BID WC  . diltiazem  30 mg Oral Q6H  . feeding supplement  237 mL Oral BID BM  . metoprolol succinate  25 mg Oral Daily  . pantoprazole  40 mg Oral Daily  . venlafaxine XR  150 mg Oral Q breakfast    Infusions:   PRN  Medications: acetaminophen, albuterol, ondansetron (ZOFRAN) IV, traZODone    Assessment/Plan   1. Atrial fibrillation: New diagnosis.  By symptoms, suspect she has been in it for at least 2 wks (palpitations, increased dyspnea).  Rate controlled on diltiazem gtt.  Echo with EF 50%, mild diffuse hypokinesis, normal RV, moderate MR, moderate TR, mild AI, PASP 46 with normal sized IVC.  CHADSVASC 3.  TEE-guided DCCV to NSR on 2/10 but very frequent PACs and back to AF overnight.  Now in AF 90s-100s.  Feels weak in AF.   - Continue apixaban 5 mg bid.  - Looks like she will need an anti-arrhythmic to stay in NSR, think she would be a reasonable Tikosyn candidate.  Will have EP evaluate her today. If starts Tikosyn, will need to stay over the weekend with potential DCCV on Monday if she does not convert back to NSR on her own.  2. Acute diastolic CHF: Echo as above.  She has JVD but IVC small on echo.  With elevated creatinine, will hold off on diuresis.  3. ?AKI on CKD stage 3: Unsure of baseline creatinine, was up to 2 at admission, now 1.5.  Follow closely.  4. Chronic dyspnea: She had an extensive workup with me in 2018 including unremarkable  cath and high resolution CT chest.  5. Thyroid: Normal TSH but elevated free T4, pending T3.  Will probably need endocrinology evaluation at discharge.   Length of Stay: 1  [name], MD  03/06/2020, 8:38 AM  Advanced Heart Failure Team Pager [phone] (M-F; 7a - 4p)  Please contact [facility] for night-coverage after hours (4p -7a ) and weekends on [email or web address]  Discussed with EP, low creatinine clearance makes her less than an ideal candidate for Tikosyn.  Therefore, will start flecainide 100 mg bid (no CAD on prior cath).  If she converts to NSR over weekend, can go home.  Otherwise, will arrange for DCCV Monday.  Think we need her out of atrial fibrillation before going home as she is quite symptomatic.   [name] 03/06/2020 11:51 AM

## 2020-03-06 NOTE — Progress Notes (Signed)
Initial Nutrition Assessment  DOCUMENTATION CODES:   Not applicable  INTERVENTION:    Ensure Enlive po BID, each supplement provides 350 kcal and 20 grams of protein.  MVI with minerals daily.  NUTRITION DIAGNOSIS:   Inadequate oral intake related to acute illness,poor appetite as evidenced by per patient/family report.  GOAL:   Patient will meet greater than or equal to 90% of their needs  MONITOR:   PO intake,Supplement acceptance,Labs  REASON FOR ASSESSMENT:   Malnutrition Screening Tool    ASSESSMENT:   75 yo female admitted with new onset atrial fibrillation. PMH includes Crohn's disease, HTN, anxiety, anemia, GERD.   Currently on a 2 gm sodium diet. Meal intakes not recorded. Patient reports poor appetite and poor intake for several weeks. She thinks she has lost a lot of weight, unsure how much.  Weight history reviewed. Minimal weight changes noted over the past 6 months. She weighed 72.2 kg 09/16/19, currently 70.4 kg. 2.5% weight loss within 6 months is not significant.  Labs reviewed. K 3.1  Medications reviewed and include potassium chloride.  Diet Order:   Diet Order            Diet 2 gram sodium Room service appropriate? Yes; Fluid consistency: Thin  Diet effective now                 EDUCATION NEEDS:   Not appropriate for education at this time  Skin:  Skin Assessment: Reviewed RN Assessment  Last BM:  2/10  Height:   Ht Readings from Last 1 Encounters:  03/05/20 4' 10"  (1.473 m)    Weight:   Wt Readings from Last 1 Encounters:  03/06/20 70.4 kg    Ideal Body Weight:  43.9 kg  BMI:  Body mass index is 32.44 kg/m.  Estimated Nutritional Needs:   Kcal:  1500-1700  Protein:  75-85 gm  Fluid:  1.5-1.7 L    Lucas Mallow, RD, LDN, CNSC Please refer to Amion for contact information.

## 2020-03-06 NOTE — Progress Notes (Signed)
Notified by CCMD that patient had 7 runs of Vtach. Patient resting in bed on phone. Larey Dresser MD notified.

## 2020-03-07 LAB — T3, FREE: T3, Free: 2.8 pg/mL (ref 2.0–4.4)

## 2020-03-07 LAB — CBC
HCT: 39.4 % (ref 36.0–46.0)
Hemoglobin: 12.5 g/dL (ref 12.0–15.0)
MCH: 30.7 pg (ref 26.0–34.0)
MCHC: 31.7 g/dL (ref 30.0–36.0)
MCV: 96.8 fL (ref 80.0–100.0)
Platelets: 209 10*3/uL (ref 150–400)
RBC: 4.07 MIL/uL (ref 3.87–5.11)
RDW: 14.6 % (ref 11.5–15.5)
WBC: 11.5 10*3/uL — ABNORMAL HIGH (ref 4.0–10.5)
nRBC: 0 % (ref 0.0–0.2)

## 2020-03-07 LAB — BASIC METABOLIC PANEL
Anion gap: 9 (ref 5–15)
BUN: 25 mg/dL — ABNORMAL HIGH (ref 8–23)
CO2: 28 mmol/L (ref 22–32)
Calcium: 9.4 mg/dL (ref 8.9–10.3)
Chloride: 102 mmol/L (ref 98–111)
Creatinine, Ser: 1.39 mg/dL — ABNORMAL HIGH (ref 0.44–1.00)
GFR, Estimated: 39 mL/min — ABNORMAL LOW (ref >60)
Glucose, Bld: 142 mg/dL — ABNORMAL HIGH (ref 70–99)
Potassium: 4.4 mmol/L (ref 3.5–5.1)
Sodium: 139 mmol/L (ref 135–145)

## 2020-03-07 LAB — MAGNESIUM: Magnesium: 1.6 mg/dL — ABNORMAL LOW (ref 1.7–2.4)

## 2020-03-07 MED ORDER — METOPROLOL SUCCINATE ER 50 MG PO TB24
50.0000 mg | ORAL_TABLET | Freq: Two times a day (BID) | ORAL | Status: DC
  Administered 2020-03-07 – 2020-03-09 (×5): 50 mg via ORAL
  Filled 2020-03-07 (×5): qty 1

## 2020-03-07 NOTE — Plan of Care (Signed)
  Problem: Safety: Goal: Ability to remain free from injury will improve Outcome: Progressing   

## 2020-03-07 NOTE — Progress Notes (Signed)
Progress Note   Subjective   Doing well today, the patient denies CP or SOB.  No new concerns.  She is mostly unaware of afib.  Tolerating flecainide well.  Inpatient Medications    Scheduled Meds: . apixaban  5 mg Oral BID  . balsalazide  1,500 mg Oral BID WC  . feeding supplement  237 mL Oral BID BM  . flecainide  100 mg Oral Q12H  . metoprolol succinate  25 mg Oral BID  . multivitamin with minerals  1 tablet Oral Daily  . pantoprazole  40 mg Oral Daily  . venlafaxine XR  150 mg Oral Q breakfast   Continuous Infusions:  PRN Meds: acetaminophen, albuterol, loperamide, metoprolol tartrate, ondansetron (ZOFRAN) IV, simethicone, traZODone   Vital Signs    Vitals:   03/07/20 0006 03/07/20 0436 03/07/20 0444 03/07/20 0815  BP: 126/86 115/84  112/78  Pulse: (!) 131 (!) 134  (!) 132  Resp:  18  20  Temp: 97.6 F (36.4 C) 98.2 F (36.8 C)  97.6 F (36.4 C)  TempSrc: Oral Oral  Oral  SpO2: 99% 97%  98%  Weight:   67.1 kg   Height:        Intake/Output Summary (Last 24 hours) at 03/07/2020 1053 Last data filed at 03/07/2020 0030 Gross per 24 hour  Intake --  Output 450 ml  Net -450 ml   Filed Weights   03/05/20 1610 03/06/20 0408 03/07/20 0444  Weight: 67.6 kg 70.4 kg 67.1 kg    Telemetry    afib with V rates 130s - Personally Reviewed  Physical Exam   GEN- The patient is well appearing, alert and oriented x 3 today.   Head- normocephalic, atraumatic Eyes-  Sclera clear, conjunctiva pink Ears- hearing intact Oropharynx- clear Neck- supple, Lungs-  normal work of breathing Heart- tachycardic irregular rhythm  GI- soft  Extremities- no clubbing, cyanosis, or edema  MS- no significant deformity or atrophy Skin- no rash or lesion Psych- euthymic mood, full affect Neuro- strength and sensation are intact   Labs    Chemistry Recent Labs  Lab 03/05/20 0609 03/06/20 0041 03/07/20 0316  NA 138 135 139  K 3.5 3.1* 4.4  CL 99 96* 102  CO2 23 26 28    GLUCOSE 81 144* 142*  BUN 49* 42* 25*  CREATININE 1.72* 1.55* 1.39*  CALCIUM 9.2 8.9 9.4  GFRNONAA 30* 35* 39*  ANIONGAP 16* 13 9     Hematology Recent Labs  Lab 03/05/20 0609 03/06/20 0041 03/07/20 0316  WBC 14.4* 11.1* 11.5*  RBC 3.64* 3.71* 4.07  HGB 11.3* 11.4* 12.5  HCT 34.2* 34.5* 39.4  MCV 94.0 93.0 96.8  MCH 31.0 30.7 30.7  MCHC 33.0 33.0 31.7  RDW 14.6 14.3 14.6  PLT 191 196 209     Patient ID   Kristen Gray is a 75 y.o. female with a history of recently diagnosed atrial fibrillation, Crohn's disease, HTN, anxiety, Chronic diastolic CHF, CKD III, Chronic dyspnea of unclear etiology who is being seen today for the evaluation of atrial fibrillation at the request of [name].  Assessment & Plan    1.  Persistent afib Started on flecainide yesterday Remains in afib with RVR. Increase metoprolol today to 75m BID [facility] after fle[facility]ainide load.  If she fails flecainide, I would anticipate amiodarone as our next option.  She is not a great candidate for ablation.  Continue eliquis  2. Acute diastolic dysfunction Continue diuresis  3. Acute on chronic stage III CHF Creatinine continues to improve with diuresis  4. 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  [name] MD, [facility] 03/07/2020 10:53 AM

## 2020-03-08 ENCOUNTER — Encounter (HOSPITAL_COMMUNITY): Payer: Self-pay | Admitting: Cardiology

## 2020-03-08 DIAGNOSIS — I5033 Acute on chronic diastolic (congestive) heart failure: Secondary | ICD-10-CM

## 2020-03-08 LAB — BASIC METABOLIC PANEL
Anion gap: 12 (ref 5–15)
BUN: 26 mg/dL — ABNORMAL HIGH (ref 8–23)
CO2: 25 mmol/L (ref 22–32)
Calcium: 9 mg/dL (ref 8.9–10.3)
Chloride: 100 mmol/L (ref 98–111)
Creatinine, Ser: 1.27 mg/dL — ABNORMAL HIGH (ref 0.44–1.00)
GFR, Estimated: 44 mL/min — ABNORMAL LOW (ref >60)
Glucose, Bld: 142 mg/dL — ABNORMAL HIGH (ref 70–99)
Potassium: 4 mmol/L (ref 3.5–5.1)
Sodium: 137 mmol/L (ref 135–145)

## 2020-03-08 LAB — CBC
HCT: 38.3 % (ref 36.0–46.0)
Hemoglobin: 12.7 g/dL (ref 12.0–15.0)
MCH: 31.4 pg (ref 26.0–34.0)
MCHC: 33.2 g/dL (ref 30.0–36.0)
MCV: 94.6 fL (ref 80.0–100.0)
Platelets: 219 10*3/uL (ref 150–400)
RBC: 4.05 MIL/uL (ref 3.87–5.11)
RDW: 14.7 % (ref 11.5–15.5)
WBC: 12.7 10*3/uL — ABNORMAL HIGH (ref 4.0–10.5)
nRBC: 0 % (ref 0.0–0.2)

## 2020-03-08 LAB — MAGNESIUM: Magnesium: 1.5 mg/dL — ABNORMAL LOW (ref 1.7–2.4)

## 2020-03-08 MED ORDER — MAGNESIUM SULFATE 2 GM/50ML IV SOLN
2.0000 g | Freq: Once | INTRAVENOUS | Status: AC
  Administered 2020-03-08: 2 g via INTRAVENOUS
  Filled 2020-03-08: qty 50

## 2020-03-08 NOTE — Progress Notes (Signed)
   03/08/20 1623  Clinical Encounter Type  Visited With Patient;Health care provider  Visit Type Initial  Referral From Nurse  Stress Factors  Patient Stress Factors Family relationships;Health changes   Chaplain responded to a request form the nurse to visit with the patient, who has been having some difficulties emotionally. Chaplain facilitated a life review. Chaplain offered prayer and support.Chaplain introduced spiritual care services.  Spiritual care services available as needed.   Jeri Lager, Chaplain

## 2020-03-08 NOTE — Progress Notes (Signed)
Patient called RN to room, teary eyed stating " I feel bad, I'm going to be alone" and voiced her concern about how [place] relatives and worried that she will not be able to take care of herself after scheduled cardioversion tomorrow. RN attempted to console patient and requested for Chaplin to come to bedside to talk to patient.

## 2020-03-08 NOTE — Progress Notes (Signed)
Progress Note   Subjective   Doing well today, the patient denies CP or SOB.  Remains in afib.   No new concerns  Inpatient Medications    Scheduled Meds: . apixaban  5 mg Oral BID  . balsalazide  1,500 mg Oral BID WC  . feeding supplement  237 mL Oral BID BM  . flecainide  100 mg Oral Q12H  . metoprolol succinate  50 mg Oral BID  . multivitamin with minerals  1 tablet Oral Daily  . pantoprazole  40 mg Oral Daily  . venlafaxine XR  150 mg Oral Q breakfast   Continuous Infusions:  PRN Meds: acetaminophen, albuterol, loperamide, metoprolol tartrate, ondansetron (ZOFRAN) IV, simethicone, traZODone   Vital Signs    Vitals:   03/07/20 2100 03/08/20 0030 03/08/20 0428 03/08/20 0804  BP: 123/76 121/76 119/82 106/74  Pulse: (!) 133 (!) 127 (!) 118 (!) 126  Resp:  19 20 16   Temp: 97.8 F (36.6 C) 98.2 F (36.8 C) 97.9 F (36.6 C) 98.1 F (36.7 C)  TempSrc: Oral Oral Oral Oral  SpO2: 99% 98% 99% 99%  Weight:   67 kg   Height:        Intake/Output Summary (Last 24 hours) at 03/08/2020 0930 Last data filed at 03/07/2020 2200 Gross per 24 hour  Intake --  Output 200 ml  Net -200 ml   Filed Weights   03/06/20 0408 03/07/20 0444 03/08/20 0428  Weight: 70.4 kg 67.1 kg 67 kg    Telemetry    afib with RVR - Personally Reviewed  Physical Exam   GEN- The patient is well appearing, alert and oriented x 3 today.   Head- normocephalic, atraumatic Eyes-  Sclera clear, conjunctiva pink Ears- hearing intact Oropharynx- clear Neck- supple, Lungs-  normal work of breathing Heart- irregular rate and rhythm  GI- soft  Extremities- no clubbing, cyanosis, or edema  MS- no significant deformity or atrophy Skin- no rash or lesion Psych- euthymic mood, full affect Neuro- strength and sensation are intact   Labs    Chemistry Recent Labs  Lab 03/06/20 0041 03/07/20 0316 03/08/20 0317  NA 135 139 137  K 3.1* 4.4 4.0  CL 96* 102 100  CO2 26 28 25   GLUCOSE 144* 142*  142*  BUN 42* 25* 26*  CREATININE 1.55* 1.39* 1.27*  CALCIUM 8.9 9.4 9.0  GFRNONAA 35* 39* 44*  ANIONGAP 13 9 12      Hematology Recent Labs  Lab 03/06/20 0041 03/07/20 0316 03/08/20 0317  WBC 11.1* 11.5* 12.7*  RBC 3.71* 4.07 4.05  HGB 11.4* 12.5 12.7  HCT 34.5* 39.4 38.3  MCV 93.0 96.8 94.6  MCH 30.7 30.7 31.4  MCHC 33.0 31.7 33.2  RDW 14.3 14.6 14.7  PLT 196 209 219     Patient ID   Kristen Umble Rumbleyis a 75 y.o.femalewith a history of recently diagnosed atrial fibrillation, Crohn's disease, HTN, anxiety, Chronic diastolic CHF, CKD III, Chronic dyspnea of unclear etiologywho is being seen today for the evaluation of atrial fibrillationat the request of [name].  Assessment & Plan    1.  Persistent afib Flecainide started on Friday Will plan cardioversion tomorrow I will make NPO Risks of cardioversion discussed with patient who wishes to proceed Continue eliquis If she fails flecainide, Amiodarone may be her next option.  2. Acute diastolic dysfunction Continue diuresis  3. acute on chronic stage III CHF Creatinine continues to improve with diuresis   [name] MD, [facility] 03/08/2020  9:30 AM

## 2020-03-08 NOTE — Progress Notes (Signed)
   03/08/20 0804  Assess: MEWS Score  Temp 98.1 F (36.7 C)  BP 106/74  Pulse Rate (!) 126  ECG Heart Rate (!) 131  Resp 16  Level of Consciousness Alert  SpO2 99 %  O2 Device Room Air  Assess: MEWS Score  MEWS Temp 0  MEWS Systolic 0  MEWS Pulse 3  MEWS RR 0  MEWS LOC 0  MEWS Score 3  MEWS Score Color Yellow  Assess: if the MEWS score is Yellow or Red  Were vital signs taken at a resting state? Yes  Focused Assessment No change from prior assessment  Early Detection of Sepsis Score *See Row Information* Low  MEWS guidelines implemented *See Row Information* No, previously yellow, continue vital signs every 4 hours  Treat  Pain Scale 0-10  Pain Score 0  Notify: Charge Nurse/RN  Name of Charge Nurse/RN Notified Linda, RN  Date Charge Nurse/RN Notified 03/08/20  Time Charge Nurse/RN Notified [phone]  Document  Patient Outcome Stabilized after interventions  Progress note created (see row info) Yes  Patient remains yellow MEWS due to elevated HR r/t a-fib. Stable at this time

## 2020-03-08 NOTE — Plan of Care (Signed)
  Problem: Activity: Goal: Risk for activity intolerance will decrease Outcome: Progressing   Problem: Nutrition: Goal: Adequate nutrition will be maintained Outcome: Progressing   Problem: Coping: Goal: Level of anxiety will decrease Outcome: Progressing   

## 2020-03-09 ENCOUNTER — Inpatient Hospital Stay (HOSPITAL_COMMUNITY): Payer: PPO | Admitting: Anesthesiology

## 2020-03-09 ENCOUNTER — Encounter (HOSPITAL_COMMUNITY)
Admission: EM | Disposition: A | Discharge status: Home / Self Care | Payer: Self-pay | Source: Home / Self Care | Attending: Cardiology

## 2020-03-09 ENCOUNTER — Encounter (HOSPITAL_COMMUNITY): Payer: Self-pay | Admitting: Cardiology

## 2020-03-09 DIAGNOSIS — I4891 Unspecified atrial fibrillation: Secondary | ICD-10-CM | POA: Diagnosis not present

## 2020-03-09 HISTORY — PX: CARDIOVERSION: SHX1299

## 2020-03-09 LAB — BASIC METABOLIC PANEL
Anion gap: 13 (ref 5–15)
BUN: 31 mg/dL — ABNORMAL HIGH (ref 8–23)
CO2: 23 mmol/L (ref 22–32)
Calcium: 8.9 mg/dL (ref 8.9–10.3)
Chloride: 99 mmol/L (ref 98–111)
Creatinine, Ser: 1.43 mg/dL — ABNORMAL HIGH (ref 0.44–1.00)
GFR, Estimated: 38 mL/min — ABNORMAL LOW (ref >60)
Glucose, Bld: 164 mg/dL — ABNORMAL HIGH (ref 70–99)
Potassium: 4.1 mmol/L (ref 3.5–5.1)
Sodium: 135 mmol/L (ref 135–145)

## 2020-03-09 LAB — CBC
HCT: 40.8 % (ref 36.0–46.0)
Hemoglobin: 13.2 g/dL (ref 12.0–15.0)
MCH: 30.6 pg (ref 26.0–34.0)
MCHC: 32.4 g/dL (ref 30.0–36.0)
MCV: 94.4 fL (ref 80.0–100.0)
Platelets: 219 10*3/uL (ref 150–400)
RBC: 4.32 MIL/uL (ref 3.87–5.11)
RDW: 14.5 % (ref 11.5–15.5)
WBC: 13.1 10*3/uL — ABNORMAL HIGH (ref 4.0–10.5)
nRBC: 0.4 % — ABNORMAL HIGH (ref 0.0–0.2)

## 2020-03-09 LAB — MAGNESIUM: Magnesium: 2.2 mg/dL (ref 1.7–2.4)

## 2020-03-09 SURGERY — CARDIOVERSION
Anesthesia: General

## 2020-03-09 MED ORDER — SODIUM CHLORIDE 0.9 % IV SOLN: INTRAVENOUS | Status: DC | PRN

## 2020-03-09 MED ORDER — LIDOCAINE 2% (20 MG/ML) 5 ML SYRINGE
INTRAMUSCULAR | Status: DC | PRN
  Administered 2020-03-09: 80 mg via INTRAVENOUS

## 2020-03-09 MED ORDER — PHENYLEPHRINE 40 MCG/ML (10ML) SYRINGE FOR IV PUSH (FOR BLOOD PRESSURE SUPPORT)
PREFILLED_SYRINGE | INTRAVENOUS | Status: DC | PRN
  Administered 2020-03-09: 80 ug via INTRAVENOUS
  Administered 2020-03-09: 120 ug via INTRAVENOUS

## 2020-03-09 MED ORDER — PROPOFOL 10 MG/ML IV BOLUS
INTRAVENOUS | Status: DC | PRN
  Administered 2020-03-09: 40 mg via INTRAVENOUS

## 2020-03-09 MED ORDER — FLECAINIDE ACETATE 100 MG PO TABS: 100.0000 mg | ORAL_TABLET | Freq: Two times a day (BID) | ORAL | 6 refills | Status: DC

## 2020-03-09 MED ORDER — APIXABAN 5 MG PO TABS: 5.0000 mg | ORAL_TABLET | Freq: Two times a day (BID) | ORAL | 6 refills | Status: DC

## 2020-03-09 MED ORDER — METOPROLOL SUCCINATE ER 50 MG PO TB24: 50.0000 mg | ORAL_TABLET | Freq: Two times a day (BID) | ORAL | 6 refills | Status: DC

## 2020-03-09 MED ORDER — EPHEDRINE SULFATE-NACL 50-0.9 MG/10ML-% IV SOSY
PREFILLED_SYRINGE | INTRAVENOUS | Status: DC | PRN
  Administered 2020-03-09: 10 mg via INTRAVENOUS

## 2020-03-09 NOTE — Progress Notes (Signed)
Patient ID: Kristen Gray, female   DOB: 24-Jun-1945, 75 y.o.   MRN: [id number]     Advanced Heart Failure Rounding Note  PCP-Cardiologist: No primary care provider on file.   Subjective:    She remains in atrial fibrillation today, rate 100s on flecainide and Toprol XL.  Short of breath walking.    Echo: EF 50%, mild diffuse hypokinesis, normal RV, moderate MR, moderate TR, mild AI, PASP 46 with normal sized IVC.  TEE: EF 50-55%, normal RV, moderate TR, moderate central MR, mild AI  Objective:   Weight Range: 68.8 kg Body mass index is 31.7 kg/m.   Vital Signs:   Temp:  [97.5 F (36.4 C)-97.8 F (36.6 C)] 97.5 F (36.4 C) (02/14 0830) Pulse Rate:  [96-117] 96 (02/14 0629) Resp:  [16-18] 16 (02/14 0629) BP: (102-128)/(54-87) 123/68 (02/14 0830) SpO2:  [98 %-100 %] 99 % (02/14 0629) Weight:  [68.8 kg] 68.8 kg (02/14 0629) Last BM Date: 03/07/20  Weight change: Filed Weights   03/07/20 0444 03/08/20 0428 03/09/20 0629  Weight: 67.1 kg 67 kg 68.8 kg    Intake/Output:   Intake/Output Summary (Last 24 hours) at 03/09/2020 0837 Last data filed at 03/08/2020 1500 Gross per 24 hour  Intake 408.23 ml  Output 1 ml  Net 407.23 ml      Physical Exam    General: NAD Neck: JVP not elevated, no thyromegaly or thyroid nodule.  Lungs: Clear to auscultation bilaterally with normal respiratory effort. CV: Nondisplaced PMI.  Heart mildly tachy, irregular S1/S2, no S3/S4, 2/6 HSM LLSB.  No peripheral edema.   Abdomen: Soft, nontender, no hepatosplenomegaly, no distention.  Skin: Intact without lesions or rashes.  Neurologic: Alert and oriented x 3.  Psych: Normal affect. Extremities: No clubbing or cyanosis.  HEENT: Normal.    Telemetry   Atrial fibrillation rate 90s-100s (personally reviewed)   Labs    CBC Recent Labs    03/08/20 0317 03/09/20 0130  WBC 12.7* 13.1*  HGB 12.7 13.2  HCT 38.3 40.8  MCV 94.6 94.4  PLT 219 224   Basic Metabolic Panel Recent Labs     03/08/20 0317 03/09/20 0130  NA 137 135  K 4.0 4.1  CL 100 99  CO2 25 23  GLUCOSE 142* 164*  BUN 26* 31*  CREATININE 1.27* 1.43*  CALCIUM 9.0 8.9  MG 1.5* 2.2   Liver Function Tests No results for input(s): AST, ALT, ALKPHOS, BILITOT, PROT, ALBUMIN in the last 72 hours. No results for input(s): LIPASE, AMYLASE in the last 72 hours. Cardiac Enzymes No results for input(s): CKTOTAL, CKMB, CKMBINDEX, TROPONINI in the last 72 hours.  BNP: BNP (last 3 results) Recent Labs    03/04/20 1801  BNP 877.8*    ProBNP (last 3 results) No results for input(s): PROBNP in the last 8760 hours.   D-Dimer No results for input(s): DDIMER in the last 72 hours. Hemoglobin A1C No results for input(s): HGBA1C in the last 72 hours. Fasting Lipid Panel No results for input(s): CHOL, HDL, LDLCALC, TRIG, CHOLHDL, LDLDIRECT in the last 72 hours. Thyroid Function Tests No results for input(s): TSH, T4TOTAL, T3FREE, THYROIDAB in the last 72 hours.  Invalid input(s): FREET3  Other results:   Imaging    No results found.   Medications:     Scheduled Medications: . apixaban  5 mg Oral BID  . balsalazide  1,500 mg Oral BID WC  . feeding supplement  237 mL Oral BID BM  . flecainide  100 mg Oral Q12H  . metoprolol succinate  50 mg Oral BID  . multivitamin with minerals  1 tablet Oral Daily  . pantoprazole  40 mg Oral Daily  . venlafaxine XR  150 mg Oral Q breakfast    Infusions:   PRN Medications: acetaminophen, albuterol, loperamide, metoprolol tartrate, ondansetron (ZOFRAN) IV, simethicone, traZODone    Assessment/Plan   1. Atrial fibrillation: New diagnosis.  By symptoms, suspect she has been in it for at least 2 wks (palpitations, increased dyspnea). Echo with EF 50%, mild diffuse hypokinesis, normal RV, moderate MR, moderate TR, mild AI, PASP 46 with normal sized IVC.  CHADSVASC 3.  TEE-guided DCCV to NSR on 2/10 but very frequent PACs and back to AF overnight.  Now in  AF 90s-100s.  Feels weak in AF.  Started on flecainide and Toprol XL, not thought to be good Tikosyn candidate due to renal function.   - Continue apixaban 5 mg bid.  - Currently on flecainide + Toprol XL.  Amiodarone would be option if flecainide fails.  - DCCV today.  Discussed risks/benefits and she agrees to procedure.   2. Acute diastolic CHF: Echo as above.  She has JVD but IVC small on echo.  With elevated creatinine, will hold off on diuresis.  3. ?AKI on CKD stage 3: Unsure of baseline creatinine, was up to 2 at admission, now 1.4.  Follow closely.  4. Chronic dyspnea: She had an extensive workup with me in 2018 including unremarkable cath and high resolution CT chest.  5. Thyroid: Normal TSH but elevated free T4.  Free T3 normal.  Will probably need endocrinology evaluation at discharge.   Length of Stay: [place], MD  03/09/2020, 8:37 AM  Advanced Heart Failure Team Pager [phone] (M-F; 7a - 4p)  Please contact [facility] for night-coverage after hours (4p -7a ) and weekends on [email or web address]

## 2020-03-09 NOTE — Plan of Care (Signed)
  Problem: Education: Goal: Knowledge of General Education information will improve Description: Including pain rating scale, medication(s)/side effects and non-pharmacologic comfort measures Outcome: Progressing   Problem: Clinical Measurements: Goal: Ability to maintain clinical measurements within normal limits will improve Outcome: Progressing Goal: Will remain free from infection Outcome: Progressing Goal: Diagnostic test results will improve Outcome: Progressing Goal: Cardiovascular complication will be avoided Outcome: Progressing   Problem: Nutrition: Goal: Adequate nutrition will be maintained Outcome: Progressing   Problem: Coping: Goal: Level of anxiety will decrease Outcome: Progressing   Problem: Elimination: Goal: Will not experience complications related to bowel motility Outcome: Progressing   Problem: Pain Managment: Goal: General experience of comfort will improve Outcome: Progressing   Problem: Skin Integrity: Goal: Risk for impaired skin integrity will decrease Outcome: Progressing

## 2020-03-09 NOTE — Transfer of Care (Signed)
Immediate Anesthesia Transfer of Care Note  Patient: Kristen Gray  Procedure(s) Performed: CARDIOVERSION (N/A )  Patient Location: Endoscopy Unit  Anesthesia Type:General  Level of Consciousness: drowsy  Airway & Oxygen Therapy: Patient Spontanous Breathing and Patient connected to nasal cannula oxygen  Post-op Assessment: Report given to RN and Post -op Vital signs reviewed and stable  Post vital signs: Reviewed and stable  Last Vitals:  Vitals Value Taken Time  BP    Temp    Pulse    Resp    SpO2      Last Pain:  Vitals:   03/09/20 1149  TempSrc: Oral  PainSc: 0-No pain         Complications: No complications documented.

## 2020-03-09 NOTE — Progress Notes (Signed)
Responded to patient room approx 11AM today at request of the patient.  Patient found lying in bed in street clothes, she has pulled out her IV, taken off her Heart Monitor, and is in not visible distress.  Patient reports distrust of everyone in the hospital and believes her current health situation to be made up.  She reports she trusts me the Probation officer of this note. We spent 30 minutes discussing why she is admitted in the hospital and she was agreeable to participating in procedure for her heart rhythm.  Pt reported that she wanted someone she trusted to go to the procedure with her and I agreed to walk with her.  I introduced her to staff in procedure area and returned to the unit.    In our conversation I learned Pt does not have a support system in the area outside of a neighbor that she speaks to when she sees her out side of her apartment.  She reports to having no friends or outlets to reach to for help.  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  She continues to report mistrust of the hospital, doctors, and nursing staff assigned to her today.  SHe has multiple complaints that I am not able to resolve. I will follow up with her after her procedure.  Richardean Canal RN, BSN, CCRN

## 2020-03-09 NOTE — H&P (View-Only) (Signed)
Patient ID: Kristen Gray, female   DOB: 1945-06-18, 75 y.o.   MRN: [id number]     Advanced Heart Failure Rounding Note  PCP-Cardiologist: No primary care provider on file.   Subjective:    She remains in atrial fibrillation today, rate 100s on flecainide and Toprol XL.  Short of breath walking.    Echo: EF 50%, mild diffuse hypokinesis, normal RV, moderate MR, moderate TR, mild AI, PASP 46 with normal sized IVC.  TEE: EF 50-55%, normal RV, moderate TR, moderate central MR, mild AI  Objective:   Weight Range: 68.8 kg Body mass index is 31.7 kg/m.   Vital Signs:   Temp:  [97.5 F (36.4 C)-97.8 F (36.6 C)] 97.5 F (36.4 C) (02/14 0830) Pulse Rate:  [96-117] 96 (02/14 0629) Resp:  [16-18] 16 (02/14 0629) BP: (102-128)/(54-87) 123/68 (02/14 0830) SpO2:  [98 %-100 %] 99 % (02/14 0629) Weight:  [68.8 kg] 68.8 kg (02/14 0629) Last BM Date: 03/07/20  Weight change: Filed Weights   03/07/20 0444 03/08/20 0428 03/09/20 0629  Weight: 67.1 kg 67 kg 68.8 kg    Intake/Output:   Intake/Output Summary (Last 24 hours) at 03/09/2020 0837 Last data filed at 03/08/2020 1500 Gross per 24 hour  Intake 408.23 ml  Output 1 ml  Net 407.23 ml      Physical Exam    General: NAD Neck: JVP not elevated, no thyromegaly or thyroid nodule.  Lungs: Clear to auscultation bilaterally with normal respiratory effort. CV: Nondisplaced PMI.  Heart mildly tachy, irregular S1/S2, no S3/S4, 2/6 HSM LLSB.  No peripheral edema.   Abdomen: Soft, nontender, no hepatosplenomegaly, no distention.  Skin: Intact without lesions or rashes.  Neurologic: Alert and oriented x 3.  Psych: Normal affect. Extremities: No clubbing or cyanosis.  HEENT: Normal.    Telemetry   Atrial fibrillation rate 90s-100s (personally reviewed)   Labs    CBC Recent Labs    03/08/20 0317 03/09/20 0130  WBC 12.7* 13.1*  HGB 12.7 13.2  HCT 38.3 40.8  MCV 94.6 94.4  PLT 219 654   Basic Metabolic Panel Recent Labs     03/08/20 0317 03/09/20 0130  NA 137 135  K 4.0 4.1  CL 100 99  CO2 25 23  GLUCOSE 142* 164*  BUN 26* 31*  CREATININE 1.27* 1.43*  CALCIUM 9.0 8.9  MG 1.5* 2.2   Liver Function Tests No results for input(s): AST, ALT, ALKPHOS, BILITOT, PROT, ALBUMIN in the last 72 hours. No results for input(s): LIPASE, AMYLASE in the last 72 hours. Cardiac Enzymes No results for input(s): CKTOTAL, CKMB, CKMBINDEX, TROPONINI in the last 72 hours.  BNP: BNP (last 3 results) Recent Labs    03/04/20 1801  BNP 877.8*    ProBNP (last 3 results) No results for input(s): PROBNP in the last 8760 hours.   D-Dimer No results for input(s): DDIMER in the last 72 hours. Hemoglobin A1C No results for input(s): HGBA1C in the last 72 hours. Fasting Lipid Panel No results for input(s): CHOL, HDL, LDLCALC, TRIG, CHOLHDL, LDLDIRECT in the last 72 hours. Thyroid Function Tests No results for input(s): TSH, T4TOTAL, T3FREE, THYROIDAB in the last 72 hours.  Invalid input(s): FREET3  Other results:   Imaging    No results found.   Medications:     Scheduled Medications: . apixaban  5 mg Oral BID  . balsalazide  1,500 mg Oral BID WC  . feeding supplement  237 mL Oral BID BM  . flecainide  100 mg Oral Q12H  . metoprolol succinate  50 mg Oral BID  . multivitamin with minerals  1 tablet Oral Daily  . pantoprazole  40 mg Oral Daily  . venlafaxine XR  150 mg Oral Q breakfast    Infusions:   PRN Medications: acetaminophen, albuterol, loperamide, metoprolol tartrate, ondansetron (ZOFRAN) IV, simethicone, traZODone    Assessment/Plan   1. Atrial fibrillation: New diagnosis.  By symptoms, suspect she has been in it for at least 2 wks (palpitations, increased dyspnea). Echo with EF 50%, mild diffuse hypokinesis, normal RV, moderate MR, moderate TR, mild AI, PASP 46 with normal sized IVC.  CHADSVASC 3.  TEE-guided DCCV to NSR on 2/10 but very frequent PACs and back to AF overnight.  Now in  AF 90s-100s.  Feels weak in AF.  Started on flecainide and Toprol XL, not thought to be good Tikosyn candidate due to renal function.   - Continue apixaban 5 mg bid.  - Currently on flecainide + Toprol XL.  Amiodarone would be option if flecainide fails.  - DCCV today.  Discussed risks/benefits and she agrees to procedure.   2. Acute diastolic CHF: Echo as above.  She has JVD but IVC small on echo.  With elevated creatinine, will hold off on diuresis.  3. ?AKI on CKD stage 3: Unsure of baseline creatinine, was up to 2 at admission, now 1.4.  Follow closely.  4. Chronic dyspnea: She had an extensive workup with me in 2018 including unremarkable cath and high resolution CT chest.  5. Thyroid: Normal TSH but elevated free T4.  Free T3 normal.  Will probably need endocrinology evaluation at discharge.   Length of Stay: [place], MD  03/09/2020, 8:37 AM  Advanced Heart Failure Team Pager [phone] (M-F; 7a - 4p)  Please contact [facility] for night-coverage after hours (4p -7a ) and weekends on [email or web address]

## 2020-03-09 NOTE — Anesthesia Postprocedure Evaluation (Signed)
Anesthesia Post Note  Patient: Kristen Gray  Procedure(s) Performed: CARDIOVERSION (N/A )     Patient location during evaluation: PACU Anesthesia Type: General Level of consciousness: awake Pain management: pain level controlled Vital Signs Assessment: post-procedure vital signs reviewed and stable Respiratory status: spontaneous breathing and respiratory function stable Cardiovascular status: stable Postop Assessment: no apparent nausea or vomiting Anesthetic complications: no   No complications documented.  Last Vitals:  Vitals:   03/09/20 1342 03/09/20 1345  BP: 107/63 107/63  Pulse: 67   Resp: 16   Temp: (!) 36.3 C   SpO2:      Last Pain:  Vitals:   03/09/20 1342  TempSrc: Axillary  PainSc:                  [name]

## 2020-03-09 NOTE — Progress Notes (Signed)
Responded to staff request to come and assist with patient related to her wanting to leave.  When I arrived to the patient room it was similar to earlier in the day.  She had removed all monitoring equipment and her IV.  She had put on her street clothes and was demanding to leave.  I attempted to deescalate her and was not successful.  The RN reported the cardioversion was successful.  Patient was calling a friend to come and pick her up and I did talk to this friend briefly making her aware the doctors had not cleared her to leave just yet.  We made contact with the Heart Failure team and Darrick Grinder NP and [name] both responded to the room.  Discharge plan was put together, Medications sent to her pharmacy, and Pt agreed to take her evening dose of Metoprolol 34m, Flecainide 1080m and Eliquis 27m83mrior to leaving the hospital. Patient refused to allow for Vital signs to be taken prior to administration. Rn and I reviewed AVS with patient.  Assisted patient to Cab she insisted on calling.    ChrRichardean Canal, BSN, CCRN

## 2020-03-09 NOTE — Procedures (Signed)
Electrical Cardioversion Procedure Note Kristen Gray [id number] [birth year]  Procedure: Electrical Cardioversion Indications:  Atrial Fibrillation  Procedure Details Consent: Risks of procedure as well as the alternatives and risks of each were explained to the (patient/caregiver).  Consent for procedure obtained. Time Out: Verified patient identification, verified procedure, site/side was marked, verified correct patient position, special equipment/implants available, medications/allergies/relevent history reviewed, required imaging and test results available.  Performed  Patient placed on cardiac monitor, pulse oximetry, supplemental oxygen as necessary.  Sedation given: Propofol per anesthesiology Pacer pads placed anterior and posterior chest.  Cardioverted 1 time(s).  Cardioverted at [place].  Evaluation Findings: Post procedure EKG shows: NSR Complications: None Patient did tolerate procedure well.   [name] 03/09/2020, 12:54 PM

## 2020-03-09 NOTE — Anesthesia Procedure Notes (Signed)
Procedure Name: General with mask airway Date/Time: 03/09/2020 12:42 PM Performed by: Imagene Riches, CRNA Pre-anesthesia Checklist: Patient identified, Emergency Drugs available, Suction available, Patient being monitored and Timeout performed Oxygen Delivery Method: Ambu bag Preoxygenation: Pre-oxygenation with 100% oxygen

## 2020-03-09 NOTE — Anesthesia Preprocedure Evaluation (Addendum)
Anesthesia Evaluation  Patient identified by MRN, date of birth, ID band Patient awake    Reviewed: Allergy & Precautions, NPO status , Patient's Chart, lab work & pertinent test results  History of Anesthesia Complications Negative for: history of anesthetic complications  Airway Mallampati: II  TM Distance: >3 FB Neck ROM: Full    Dental  (+) Dental Advisory Given, Poor Dentition, Chipped,    Pulmonary shortness of breath, sleep apnea (does not use CPAP) , COPD,    breath sounds clear to auscultation       Cardiovascular hypertension, Pt. on medications and Pt. on home beta blockers (-) angina+ dysrhythmias Atrial Fibrillation + Valvular Problems/Murmurs MR and AI  Rhythm:Irregular Rate:Tachycardia     Neuro/Psych PSYCHIATRIC DISORDERS Anxiety Depression    GI/Hepatic Neg liver ROS, GERD  Medicated and Controlled,crohns   Endo/Other  negative endocrine ROS  Renal/GU Renal InsufficiencyRenal disease (creat 1.43)     Musculoskeletal  (+) Arthritis ,   Abdominal Normal abdominal exam  (+)   Peds  Hematology eliquis   Anesthesia Other Findings   Reproductive/Obstetrics                            Anesthesia Physical  Anesthesia Plan  ASA: III  Anesthesia Plan: General   Post-op Pain Management:    Induction:   PONV Risk Score and Plan: 3 and Ondansetron and Treatment may vary due to age or medical condition  Airway Management Planned: Mask  Additional Equipment: None  Intra-op Plan:   Post-operative Plan:   Informed Consent: I have reviewed the patients History and Physical, chart, labs and discussed the procedure including the risks, benefits and alternatives for the proposed anesthesia with the patient or authorized representative who has indicated his/her understanding and acceptance.     Dental advisory given  Plan Discussed with: CRNA and Surgeon  Anesthesia Plan  Comments:         Anesthesia Quick Evaluation

## 2020-03-09 NOTE — Discharge Summary (Addendum)
Advanced Heart Failure Team  Discharge Summary   Patient ID: Kristen Gray MRN: [id number], DOB/AGE: 25-Dec-1945 75 y.o. Admit date: 03/04/2020 D/C date:     03/09/2020   Primary Discharge Diagnoses:   1. A fib 2. Acute Diastolic Heart Failure 3. AKI on CKD Stage III 4. Chronic dyspnea  5. Thyroid.  Hospital Course:   Kristen Gray is a 75 y.o. female with Crohn's disease, hypertension, and anxiety who is being admitted today for the evaluation and management of atrial fibrillation. Placed on diltiazem drip and TEE/DC-CV with brief return to NSR but then back to A fib. EP consulted and started flecainide and toprol xl. On 2/14 she had cardioversion with restoration of NSR.   After cardioversion she demanded discharge and removed IVs and telemetry.  Dr Aundra Dubin called and assess her prior to d/c. Discharge home with meds being delivered to her home on 03/10/20. We have asked her to take evening medications to reduce risk of stroke.   She is a [facility] patient. We will ask [facility] to follow closely. I have also referred to HFSW to follow up. May need to involve HF Paramedicine if she will allow. She lives alone but refused home health. She has follow up in the HF/A fib clinic next week.   1. Atrial fibrillation: New diagnosis.  By symptoms, suspect she has been in it for at least 2 wks (palpitations, increased dyspnea). Echo with EF 50%, mild diffuse hypokinesis, normal RV, moderate MR, moderate TR, mild AI, PASP 46 with normal sized IVC.  CHADSVASC 3.  TEE-guided DCCV to NSR on 2/10 but very frequent PACs and back in A Fib.  - EP consulted with recommendations.  Started on flecainide and Toprol XL, not thought to be good Tikosyn candidate due to renal function.   - Continue apixaban 5 mg bid.  - Currently on flecainide + Toprol XL.  Amiodarone would be option if flecainide fails.  - Had DC/CV 2/14 with restoration of NSR.  - After procedure she refused the monitor/IV.  2. Acute diastolic CHF: Echo as  above.  She has JVD but IVC small on echo.  With elevated creatinine, will hold off on diuresis.  3. ?AKI on CKD stage 3: Unsure of baseline creatinine, was up to 2 at admission, now 1.4.  Follow closely.  4. Chronic dyspnea: She had an extensive workup with me in 2018 including unremarkable cath and high resolution CT chest.  5. Thyroid: Normal TSH but elevated free T4.  Free T3 normal.  Will probably need endocrinology evaluation at discharge.    Discharge Vitals: Blood pressure 107/63, pulse 67, temperature (!) 97.4 F (36.3 C), temperature source Axillary, resp. rate 16, height 4' 10"  (1.473 m), weight 68.8 kg, SpO2 96 %.  Labs: Lab Results  Component Value Date   WBC 13.1 (H) 03/09/2020   HGB 13.2 03/09/2020   HCT 40.8 03/09/2020   MCV 94.4 03/09/2020   PLT 219 03/09/2020    Recent Labs  Lab 03/09/20 0130  NA 135  K 4.1  CL 99  CO2 23  BUN 31*  CREATININE 1.43*  CALCIUM 8.9  GLUCOSE 164*   Lab Results  Component Value Date   CHOL 144 03/28/2015   HDL 61 03/28/2015   LDLCALC 51 03/28/2015   TRIG 159 03/28/2015   BNP (last 3 results) Recent Labs    03/04/20 1801  BNP 877.8*    ProBNP (last 3 results) No results for input(s): PROBNP in the last 8760  hours.   Diagnostic Studies/Procedures   No results found.  Discharge Medications   Allergies as of 03/09/2020       Reactions   Penicillin G Nausea Only   Has patient had a PCN reaction causing immediate rash, facial/tongue/throat swelling, SOB or lightheadedness with hypotension:No Has patient had a PCN reaction causing severe rash involving mucus membranes or skin necrosis: No Has patient had a PCN reaction that required hospitalization: No Has patient had a PCN reaction occurring within the last 10 years: No--NAUSEA ONLY If all of the above answers are "NO", then may proceed with Cephalosporin use.        Medication List     STOP taking these medications    atenolol-chlorthalidone 50-25 MG  tablet Commonly known as: TENORETIC       TAKE these medications    acetaminophen 500 MG tablet Commonly known as: TYLENOL Take 1,000 mg by mouth every 6 (six) hours as needed (FOR PAIN.).   albuterol 108 (90 Base) MCG/ACT inhaler Commonly known as: ProAir HFA Inhale 2 puffs into the lungs every 6 (six) hours as needed for wheezing or shortness of breath.   apixaban 5 MG Tabs tablet Commonly known as: ELIQUIS Take 1 tablet (5 mg total) by mouth 2 (two) times daily.   balsalazide 750 MG capsule Commonly known as: COLAZAL Take 2 capsules (1,500 mg total) by mouth 2 (two) times daily.   CALTRATE 600+D PO Take 2 tablets by mouth daily.   flecainide 100 MG tablet Commonly known as: TAMBOCOR Take 1 tablet (100 mg total) by mouth every 12 (twelve) hours.   folic acid 1 MG tablet Commonly known as: FOLVITE TAKE 1 TABLET BY MOUTH EVERY DAY   metoprolol succinate 50 MG 24 hr tablet Commonly known as: TOPROL-XL Take 1 tablet (50 mg total) by mouth 2 (two) times daily. Take with or immediately following a meal.   omeprazole 20 MG capsule Commonly known as: PRILOSEC Take 1 capsule (20 mg total) by mouth daily before breakfast.   Systane Balance 0.6 % Soln Generic drug: Propylene Glycol Place 1 drop into both eyes in the morning and at bedtime.   traZODone 100 MG tablet Commonly known as: DESYREL TAKE 1/2 TABLET BY MOUTH NIGHTLY AT BEDTIME What changed:  how much to take how to take this when to take this   venlafaxine XR 75 MG 24 hr capsule Commonly known as: EFFEXOR-XR Take 2 capsules (150 mg total) by mouth daily with breakfast.   vitamin B-12 1000 MCG tablet Commonly known as: CYANOCOBALAMIN Take 1 tablet (1,000 mcg total) by mouth daily.   Vitamin D3 50 MCG (2000 UT) capsule TAKE 1 CAPSULE BY MOUTH EVERY DAY        Disposition   The patient will be discharged in stable condition to home. Discharge Instructions     (HEART FAILURE PATIENTS) Call MD:   Anytime you have any of the following symptoms: 1) 3 pound weight gain in 24 hours or 5 pounds in 1 week 2) shortness of breath, with or without a dry hacking cough 3) swelling in the hands, feet or stomach 4) if you have to sleep on extra pillows at night in order to breathe.   Complete by: As directed    Diet - low sodium heart healthy   Complete by: As directed    Heart Failure patients record your daily weight using the same scale at the same time of day   Complete by: As directed  Increase activity slowly   Complete by: As directed        Follow-up Information     [facility] Follow up on 03/17/2020.   Specialty: Cardiology Why: at [place] information: [address] [id number] [place] [phone]                  Duration of Discharge Encounter: Greater than 35 minutes   Signed, Darrick Grinder NP-C  03/09/2020, 4:07 PM  Patient seen with NP, agree with the above note.   She had DCCV today back to NSR.  Plan had been to observe overnight to make sure she stays in rhythm as she lives alone with no family locally and was symptomatic when in atrial fibrillation.  However, this afternoon she became increasingly agitated and insisted on discharge.  I am concerned that she may not take her medications.  We called her pharmacy and have arranged for her new medications (Toprol XL, flecainide, and Eliquis) to be delivered tomorrow morning.  We will try to convince her to take her pm meds before leaving.  There seems to be paranoia present and I am worried for some underlying dementia.  She has followup arranged in atrial fibrillation clinic.   [name] 03/09/2020 6:04 PM

## 2020-03-09 NOTE — Interval H&P Note (Signed)
History and Physical Interval Note:  03/09/2020 12:45 PM  Kristen Gray  has presented today for surgery, with the diagnosis of AFIB.  The various methods of treatment have been discussed with the patient and family. After consideration of risks, benefits and other options for treatment, the patient has consented to  Procedure(s): CARDIOVERSION (N/A) as a surgical intervention.  The patient's history has been reviewed, patient examined, no change in status, stable for surgery.  I have reviewed the patient's chart and labs.  Questions were answered to the patient's satisfaction.     Lelania Bia [employer]

## 2020-03-10 ENCOUNTER — Telehealth (HOSPITAL_COMMUNITY): Payer: Self-pay | Admitting: Licensed Clinical Social Worker

## 2020-03-10 ENCOUNTER — Telehealth: Payer: Self-pay | Admitting: Family Medicine

## 2020-03-10 DIAGNOSIS — I4891 Unspecified atrial fibrillation: Secondary | ICD-10-CM | POA: Diagnosis not present

## 2020-03-10 DIAGNOSIS — W19XXXA Unspecified fall, initial encounter: Secondary | ICD-10-CM | POA: Diagnosis not present

## 2020-03-10 DIAGNOSIS — R531 Weakness: Secondary | ICD-10-CM | POA: Diagnosis not present

## 2020-03-10 NOTE — Telephone Encounter (Signed)
CSW informed by Heart Failure team that pt insisted on leaving yesterday and there are concerns with pt safety at home- lives alone with limited support available and concerns with pt mental state.  Physician requested order be placed to [facility] care management to follow up with pt ASAP to follow pt at home.  CSW placed order to [facility]- will continue to follow and assist as needed  Jorge Ny, [facility] Desk#: [phone] Cell#: [phone]

## 2020-03-10 NOTE — Telephone Encounter (Signed)
Dr. Maudie Mercury states she feels like the patient needs to be seen in office according to today's visit notes.  Wasn't sure if she could, but also wasn't sure if it needed to wait until she could be seen in office due to her ailments.

## 2020-03-10 NOTE — Telephone Encounter (Signed)
Okay to schedule in office if pt still needs to be seen. Her visit with Dr. Maudie Mercury was on 2/8 & then pt went to the hospital.

## 2020-03-11 ENCOUNTER — Encounter (HOSPITAL_COMMUNITY): Payer: Self-pay | Admitting: Cardiology

## 2020-03-11 ENCOUNTER — Telehealth (HOSPITAL_COMMUNITY): Payer: Self-pay | Admitting: *Deleted

## 2020-03-11 NOTE — Telephone Encounter (Signed)
Joelene Millin called to let us know she received referral from Dr Aundra Dubin to help manage patient however pt is not engaging. She states she was able to speak with pt via phone, pt is very paranoid and stated she fell last night, was having double vision, and did not want to talk about it or to her and hung up on her. She states there is nothing else they can do if pt will not engage with them. Dr Aundra Dubin is aware, pt is sch for f/u 2/21 w/A-fib clinic and 2/22 with our office. Have sent message to CSW to inform them of situation as well.

## 2020-03-12 ENCOUNTER — Telehealth: Payer: Self-pay | Admitting: Family Medicine

## 2020-03-12 DIAGNOSIS — M159 Polyosteoarthritis, unspecified: Secondary | ICD-10-CM

## 2020-03-12 NOTE — Telephone Encounter (Signed)
Joelene Millin with [facility] is requesting an order for [place] for patient to receive help at home. This order must be faxed to: [facility] Team [phone]  If you have any questions please call Joelene Millin [phone]

## 2020-03-13 ENCOUNTER — Ambulatory Visit (INDEPENDENT_AMBULATORY_CARE_PROVIDER_SITE_OTHER): Payer: PPO

## 2020-03-13 DIAGNOSIS — I4891 Unspecified atrial fibrillation: Secondary | ICD-10-CM | POA: Diagnosis not present

## 2020-03-13 DIAGNOSIS — I10 Essential (primary) hypertension: Secondary | ICD-10-CM

## 2020-03-13 DIAGNOSIS — K219 Gastro-esophageal reflux disease without esophagitis: Secondary | ICD-10-CM

## 2020-03-13 NOTE — Telephone Encounter (Signed)
I left Kristen Gray a message to call me back.

## 2020-03-13 NOTE — Telephone Encounter (Signed)
[facility] called to let Kristen Gray know that they only received the order for Custodial Care. They didn't receive the whole request for custodial care and with out the request they will not be able to move further for home health care.

## 2020-03-13 NOTE — Telephone Encounter (Signed)
Re-faxed the order with additional information.

## 2020-03-13 NOTE — Progress Notes (Signed)
Chronic Care Management Pharmacy Note  03/23/2020 Name:  Kristen Gray MRN:  [id number] DOB:  [birth year]  Subjective: Kristen Gray is an 75 y.o. year old female who is a primary patient of Martinique, Malka So, MD.  The CCM team was consulted for assistance with disease management and care coordination needs.    Engaged with patient by telephone for follow up visit in response to provider referral for pharmacy case management and/or care coordination services.   Consent to Services:  The patient was given information about Chronic Care Management services, agreed to services, and gave verbal consent prior to initiation of services.  Please see initial visit note for detailed documentation.   Patient Care Team: Martinique, Betty G, MD as PCP - General (Family Medicine) Viona Gilmore, [facility] as Pharmacist (Pharmacist)  Recent office visits: 03/03/20 Colin Benton, DO: Patient presented for video visit for nausea and stress. Patient was taking 1 capsule of Effexor instead of 2 as prescribed. Recommended increasing Prilosec to twice daily x 1 week.  01/21/20 Betty Martinique, MD: Patient presented for video visit for insomnia.  09/16/19 Betty [place], MD: Patient presented for chronic conditions follow up. Recommended vitamin D 2000 units daily and decrease vitamin B12 to 1000 mcg daily.  Recent consult visits: 01/30/20 Alonza Bogus, PA-C (gastro): Patient presented for Crohn's disease initial visit.  Hospital visits: 2/9/-03/09/20 Patient underwent cardioversion.  Objective:  Lab Results  Component Value Date   CREATININE 1.41 (H) 03/18/2020   BUN 22 03/18/2020   GFR 43.85 (L) 02/20/2018   GFRNONAA 39 (L) 03/18/2020   GFRAA 46 (L) 09/16/2019   NA 139 03/18/2020   K 4.2 03/18/2020   CALCIUM 9.3 03/18/2020   CO2 29 03/18/2020    Lab Results  Component Value Date/Time   HGBA1C 4.8 02/28/2017 04:23 PM   HGBA1C 4.9 09/04/2015 12:00 AM   HGBA1C 4.4 10/21/2013 12:00 AM   GFR 43.85 (L)  02/20/2018 03:33 PM   GFR 49.09 (L) 02/28/2017 04:23 PM    Last diabetic Eye exam: No results found for: HMDIABEYEEXA  Last diabetic Foot exam: No results found for: HMDIABFOOTEX   Lab Results  Component Value Date   CHOL 144 03/28/2015   HDL 61 03/28/2015   LDLCALC 51 03/28/2015   TRIG 159 03/28/2015    Hepatic Function Latest Ref Rng & Units 03/18/2020 09/16/2019 07/28/2017  Total Protein 6.5 - 8.1 g/dL 6.2(L) 6.6 6.5  Albumin 3.5 - 5.0 g/dL 3.1(L) - -  AST 15 - 41 U/L 79(H) 18 15  ALT 0 - 44 U/L 25 8 5(L)  Alk Phosphatase 38 - 126 U/L 102 - -  Total Bilirubin 0.3 - 1.2 mg/dL 1.6(H) 0.3 0.5    Lab Results  Component Value Date/Time   TSH 1.304 03/06/2020 12:41 AM   TSH 1.886 03/05/2020 06:09 AM   TSH 1.77 09/16/2019 04:27 PM   TSH 2.78 05/16/2016 03:33 PM   FREET4 1.87 (H) 03/06/2020 12:41 AM   FREET4 2.06 (H) 03/05/2020 06:09 AM    CBC Latest Ref Rng & Units 03/18/2020 03/09/2020 03/08/2020  WBC 4.0 - 10.5 K/uL 13.9(H) 13.1(H) 12.7(H)  Hemoglobin 12.0 - 15.0 g/dL 11.9(L) 13.2 12.7  Hematocrit 36.0 - 46.0 % 38.6 40.8 38.3  Platelets 150 - 400 K/uL 213 219 219    Lab Results  Component Value Date/Time   VD25OH 29 (L) 09/16/2019 04:27 PM   VD25OH 42.61 05/16/2016 03:33 PM    Clinical ASCVD: No  The ASCVD Risk score (  Renaye Rakers., et al., 2013) failed to calculate for the following reasons:   Cannot find a previous HDL lab   Cannot find a previous total cholesterol lab   Unable to determine if patient is Non-Hispanic African American    Depression screen [facility] 2/9 01/21/2020 09/16/2019  Decreased Interest 1 3  Down, Depressed, Hopeless 2 1  PHQ - 2 Score 3 4  Altered sleeping 3 1  Tired, decreased energy 2 3  Change in appetite 1 2  Feeling bad or failure about yourself  2 2  Trouble concentrating 2 0  Moving slowly or fidgety/restless 0 0  Suicidal thoughts 0 0  PHQ-9 Score 13 12  Difficult doing work/chores Somewhat difficult Somewhat difficult    CHA2DS2/VAS  Stroke Risk Points  Current as of 8 minutes ago     5 >= 2 Points: High Risk  1 - 1.99 Points: Medium Risk  0 Points: Low Risk    Last Change:       Details    This score determines the patient's risk of having a stroke if the  patient has atrial fibrillation.       Points Metrics  1 Has Congestive Heart Failure:  Yes    Current as of 8 minutes ago  0 Has Vascular Disease:  No    Current as of 8 minutes ago  1 Has Hypertension:  Yes    Current as of 8 minutes ago  2 Age:  89    Current as of 8 minutes ago  0 Has Diabetes:  No    Current as of 8 minutes ago  0 Had Stroke:  No  Had TIA:  No  Had Thromboembolism:  No    Current as of 8 minutes ago  1 Female:  Yes    Current as of 8 minutes ago     Social History   Tobacco Use  Smoking Status Never Smoker  Smokeless Tobacco Never Used   BP Readings from Last 3 Encounters:  03/21/20 124/76  03/09/20 107/63  01/30/20 110/74   Pulse Readings from Last 3 Encounters:  03/21/20 64  03/09/20 67  01/30/20 (!) 115   Wt Readings from Last 3 Encounters:  03/19/20 151 lb 10.8 oz (68.8 kg)  03/09/20 151 lb 10.8 oz (68.8 kg)  01/30/20 157 lb 9.6 oz (71.5 kg)    Assessment/Interventions: Review of patient past medical history, allergies, medications, health status, including review of consultants reports, laboratory and other test data, was performed as part of comprehensive evaluation and provision of chronic care management services.   SDOH:  (Social Determinants of Health) assessments and interventions performed: No   CCM Care Plan  Allergies  Allergen Reactions  . Penicillin G Nausea Only    Has patient had a PCN reaction causing immediate rash, facial/tongue/throat swelling, SOB or lightheadedness with hypotension:No Has patient had a PCN reaction causing severe rash involving mucus membranes or skin necrosis: No Has patient had a PCN reaction that required hospitalization: No Has patient had a PCN reaction occurring  within the last 10 years: No--NAUSEA ONLY If all of the above answers are "NO", then may proceed with Cephalosporin use.     Medications Reviewed Today    Reviewed by Elyn Peers, CPhT (Pharmacy Technician) on 03/18/20 at [place] List Status: Complete  Medication Order Taking? Sig Documenting Provider Last Dose Status Informant  acetaminophen (TYLENOL) 500 MG tablet [id number] Yes Take 1,000 mg by mouth every 6 (six)  hours as needed (FOR PAIN.). [provider] Past Week Unknown time Active Friend  albuterol (PROAIR HFA) 108 (90 Base) MCG/ACT inhaler [id number] Yes Inhale 2 puffs into the lungs every 6 (six) hours as needed for wheezing or shortness of breath. Martinique, Betty G, MD Past Week Unknown time Active Friend  apixaban (ELIQUIS) 5 MG TABS tablet [id number] Yes Take 1 tablet (5 mg total) by mouth 2 (two) times daily. Darrick Grinder D, NP 03/18/2020 0800 Active Friend  balsalazide (COLAZAL) 750 MG capsule [id number] Yes Take 2 capsules (1,500 mg total) by mouth 2 (two) times daily. [name], PA-C 03/18/2020 Unknown time Active Friend  Calcium Carbonate-Vitamin D (CALTRATE 600+D PO) [id number] Yes Take 2 tablets by mouth daily. [provider] 03/18/2020 Unknown time Active Friend  Cholecalciferol (VITAMIN D3) 50 MCG (2000 UT) capsule [id number] Yes TAKE 1 CAPSULE BY MOUTH EVERY DAY  Patient taking differently: Take 2,000 Units by mouth daily.   Martinique, Betty G, MD 03/18/2020 Unknown time Active Friend  flecainide (TAMBOCOR) 100 MG tablet [id number] Yes Take 1 tablet (100 mg total) by mouth every 12 (twelve) hours. Darrick Grinder D, NP 03/18/2020 Unknown time Active Friend  metoprolol succinate (TOPROL-XL) 50 MG 24 hr tablet [id number] Yes Take 1 tablet (50 mg total) by mouth 2 (two) times daily. Take with or immediately following a meal. Clegg, Amy D, NP 03/18/2020 0800 Active Friend  omeprazole (PRILOSEC) 20 MG capsule [id number] Yes Take 1 capsule (20 mg total) by mouth daily  before breakfast. [place]ique, Betty G, MD 03/18/2020 Unknown time Active Friend  traZODone (DESYREL) 100 MG tablet [id number] Yes TAKE 1/2 TABLET BY MOUTH NIGHTLY AT BEDTIME  Patient taking differently: Take 100 mg by mouth at bedtime.   Martinique, Betty G, MD 03/17/2020 Unknown time Active Friend  venlafaxine XR (EFFEXOR-XR) 75 MG 24 hr capsule [id number] Yes Take 2 capsules (150 mg total) by mouth daily with breakfast.  Patient taking differently: Take 75 mg by mouth daily with breakfast.   Martinique, Betty G, MD 03/18/2020 Unknown time Active Friend  vitamin B-12 (CYANOCOBALAMIN) 1000 MCG tablet [id number] Yes Take 1 tablet (1,000 mcg total) by mouth daily. Martinique, Betty G, MD 03/18/2020 Unknown time Active Friend          Patient Active Problem List   Diagnosis Date Noted  . Atrial fibrillation with RVR ([place]) 03/05/2020  . Atrial fibrillation ([place]) 03/05/2020  . AKI (acute kidney injury) ([place])   . Moderate episode of recurrent major depressive disorder ([place]) 09/16/2019  . Crohn disease ([place]) 02/25/2019  . Chronic fatigue 02/25/2019  . Vitamin B 12 deficiency 02/25/2019  . Unilateral primary osteoarthritis, left knee 03/27/2018  . Hypokalemia 02/20/2018  . GERD (gastroesophageal reflux disease) 07/28/2017  . Unilateral primary osteoarthritis, right knee 01/31/2017  . Sebaceous cyst 10/21/2016  . Anemia 10/20/2016  . Primary osteoarthritis of right hip 09/12/2016  . Pulmonary hypertension ([place]) 07/26/2016  . OSA (obstructive sleep apnea) 07/26/2016  . Morbid obesity ([place]) 04/14/2016  . Hypertension, essential 04/13/2016  . Vitamin D deficiency 04/13/2016  . Insomnia 04/13/2016  . Generalized anxiety disorder 04/13/2016  . 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 Osteoarthritis, 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/13/2016  . Dyspnea 10/07/2015    Immunization History  Administered Date(s) Administered  . Influenza, High Dose Seasonal PF 10/21/2016  . Influenza,inj,Quad PF,6+ Mos 10/07/2015, 02/20/2018  . Janssen (J&J)  SARS-COV-2 Vaccination 06/11/2019  . Pneumococcal Conjugate-13 03/27/2014  . Pneumococcal Polysaccharide-23 03/27/2015  . Zoster 09/25/2007    Conditions to be addressed/monitored:  Hypertension, GERD, Depression, Osteoarthritis and insomnia, vitamin B12 deficiency, dry eyes, and Crohn's disease  There are no care plans that you recently modified to display for this patient.    Medication Assistance: None required.  Patient affirms current coverage meets needs.  Patient's preferred pharmacy is:  [facility] [place], [place] - [address] [place] [address] Phone: [phone] Fax: [phone]  Uses pill box? No - aligning bottles  We discussed: Current pharmacy is preferred with insurance plan and patient is satisfied with pharmacy services Patient decided to: Continue current medication management strategy  Care Plan and Follow Up Patient Decision:  Patient agrees to Care Plan and Follow-up.  Plan: The care management team will reach out to the patient again over the next 180 days.  Jeni Salles, PharmD [facility] Clinical Pharmacist [place] at [place]

## 2020-03-13 NOTE — Telephone Encounter (Signed)
Order placed & faxed to [facility] Team.

## 2020-03-13 NOTE — Addendum Note (Signed)
Addended by: Rodrigo Ran on: 03/13/2020 10:13 AM   Modules accepted: Orders

## 2020-03-16 ENCOUNTER — Ambulatory Visit (HOSPITAL_COMMUNITY): Payer: PPO | Admitting: Physician Assistant

## 2020-03-16 NOTE — Telephone Encounter (Signed)
Spoke with [employer]. They cannot do anything without a referral/dx code. Referral placed to home health.

## 2020-03-16 NOTE — Addendum Note (Signed)
Addended by: Rodrigo Ran on: 03/16/2020 09:59 AM   Modules accepted: Orders

## 2020-03-17 ENCOUNTER — Ambulatory Visit (HOSPITAL_COMMUNITY): Payer: PPO | Admitting: Physician Assistant

## 2020-03-17 ENCOUNTER — Telehealth (HOSPITAL_COMMUNITY): Payer: Self-pay | Admitting: *Deleted

## 2020-03-17 ENCOUNTER — Encounter (HOSPITAL_COMMUNITY): Payer: PPO

## 2020-03-17 NOTE — Progress Notes (Incomplete)
Primary Care Physician: Martinique, Betty G, MD Primary Cardiologist: Dr Aundra Dubin Primary Electrophysiologist: Dr Rayann Heman Referring Physician: Dr Alethia Berthold is a 75 y.o. female with a history of atrial fibrillation, Crohn's disease, HTN, anxiety, Chronic diastolic CHF, CKD III  who presents for follow up in the [facility]. She was admitted 03/04/2020 with 2 days of severe dyspnea, tachy-palpitations, chest discomfort, and nausea that started after an issue arose with her apartment. On presentation to the ED she was found to be in rapid atrial fibrillation, which was a new diagnosis. As she was highly symptomatic with > 48 hrs of symptoms, she was planned for TEE/DCC. Started on Eliquis 5 mg BID for a CHADS2VASC score of 4. Echo showed LVEF 50%. TEE/DCC done 03/05/20 with return to [facility]. Unfortunately, she continued to have very frequent atrial ectopy, and reverted back to AF. Dr Rayann Heman consulted and she was started on flecainide with repeat DCCV on 03/09/20. After cardioversion she demanded discharge and removed IVs and telemetry.  Dr Aundra Dubin called and assess her prior to d/c. ***  Today, she denies symptoms of ***palpitations, chest pain, shortness of breath, orthopnea, PND, lower extremity edema, dizziness, presyncope, syncope, snoring, daytime somnolence, bleeding, or neurologic sequela. The patient is tolerating medications without difficulties and is otherwise without complaint today.    Atrial Fibrillation Risk Factors:  she does not have symptoms or diagnosis of sleep apnea. she does not have a history of rheumatic fever.   she has a BMI of There is no height or weight on file to calculate BMI.. There were no vitals filed for this visit.  Family History  Problem Relation Age of Onset  . Heart disease Mother        RF  . Stroke Father   . Diabetes Father   . Hyperlipidemia Father   . Hypertension Father      Atrial Fibrillation Management  history:  Previous antiarrhythmic drugs: flecainide Previous cardioversions: 03/05/20, 03/09/20 Previous ablations: none CHADS2VASC score: 4 Anticoagulation history: Eliquis   Past Medical History:  Diagnosis Date  . Anemia   . Anxiety   . Arthritis   . Crohn's disease ([place])   . Depression   . GERD (gastroesophageal reflux disease)   . Heart murmur   . Hypertension   . Reflux    Past Surgical History:  Procedure Laterality Date  . ANKLE SURGERY    . CARDIOVERSION N/A 03/05/2020   Procedure: CARDIOVERSION;  Surgeon: Larey Dresser, MD;  Location: [facility] ENDOSCOPY;  Service: Cardiovascular;  Laterality: N/A;  . CARDIOVERSION N/A 03/09/2020   Procedure: CARDIOVERSION;  Surgeon: Larey Dresser, MD;  Location: [facility] ENDOSCOPY;  Service: Cardiovascular;  Laterality: N/A;  . CARPAL TUNNEL RELEASE    . CHOLECYSTECTOMY    . COLON SURGERY    . RIGHT/LEFT HEART CATH AND CORONARY ANGIOGRAPHY N/A 09/15/2016   Procedure: RIGHT/LEFT HEART CATH AND CORONARY ANGIOGRAPHY;  Surgeon: Larey Dresser, MD;  Location: [facility];  Service: Cardiovascular;  Laterality: N/A;  . TEE WITHOUT CARDIOVERSION N/A 03/05/2020   Procedure: TRANSESOPHAGEAL ECHOCARDIOGRAM (TEE);  Surgeon: Larey Dresser, MD;  Location: [facility] ENDOSCOPY;  Service: Cardiovascular;  Laterality: N/A;    Current Outpatient Medications  Medication Sig Dispense Refill  . acetaminophen (TYLENOL) 500 MG tablet Take 1,000 mg by mouth every 6 (six) hours as needed (FOR PAIN.).    Marland Kitchen albuterol (PROAIR HFA) 108 (90 Base) MCG/ACT inhaler Inhale 2 puffs into the lungs  every 6 (six) hours as needed for wheezing or shortness of breath. 18 g 2  . apixaban (ELIQUIS) 5 MG TABS tablet Take 1 tablet (5 mg total) by mouth 2 (two) times daily. 60 tablet 6  . balsalazide (COLAZAL) 750 MG capsule Take 2 capsules (1,500 mg total) by mouth 2 (two) times daily. 120 capsule 11  . Calcium Carbonate-Vitamin D (CALTRATE 600+D PO) Take 2 tablets by mouth daily.     . Cholecalciferol (VITAMIN D3) 50 MCG (2000 UT) capsule TAKE 1 CAPSULE BY MOUTH EVERY DAY (Patient taking differently: No sig reported) 30 capsule 3  . flecainide (TAMBOCOR) 100 MG tablet Take 1 tablet (100 mg total) by mouth every 12 (twelve) hours. 60 tablet 6  . folic acid (FOLVITE) 1 MG tablet TAKE 1 TABLET BY MOUTH EVERY DAY (Patient not taking: No sig reported) 30 tablet 3  . metoprolol succinate (TOPROL-XL) 50 MG 24 hr tablet Take 1 tablet (50 mg total) by mouth 2 (two) times daily. Take with or immediately following a meal. 60 tablet 6  . omeprazole (PRILOSEC) 20 MG capsule Take 1 capsule (20 mg total) by mouth daily before breakfast. 90 capsule 0  . Propylene Glycol (SYSTANE BALANCE) 0.6 % SOLN Place 1 drop into both eyes in the morning and at bedtime.    . traZODone (DESYREL) 100 MG tablet TAKE 1/2 TABLET BY MOUTH NIGHTLY AT BEDTIME (Patient taking differently: No sig reported) 45 tablet 1  . venlafaxine XR (EFFEXOR-XR) 75 MG 24 hr capsule Take 2 capsules (150 mg total) by mouth daily with breakfast. 60 capsule 3  . vitamin B-12 (CYANOCOBALAMIN) 1000 MCG tablet Take 1 tablet (1,000 mcg total) by mouth daily. 30 tablet 3   No current facility-administered medications for this visit.    Allergies  Allergen Reactions  . Penicillin G Nausea Only    Has patient had a PCN reaction causing immediate rash, facial/tongue/throat swelling, SOB or lightheadedness with hypotension:No Has patient had a PCN reaction causing severe rash involving mucus membranes or skin necrosis: No Has patient had a PCN reaction that required hospitalization: No Has patient had a PCN reaction occurring within the last 10 years: No--NAUSEA ONLY If all of the above answers are "NO", then may proceed with Cephalosporin use.     Social History   Socioeconomic History  . Marital status: Widowed    Spouse name: Not on file  . Number of children: Not on file  . Years of education: Not on file  . Highest  education level: Not on file  Occupational History  . Occupation: retired  Tobacco Use  . Smoking status: Never Smoker  . Smokeless tobacco: Never Used  Vaping Use  . Vaping Use: Never used  Substance and Sexual Activity  . Alcohol use: No  . Drug use: No  . Sexual activity: Not Currently  Other Topics Concern  . Not on file  Social History Narrative  . Not on file   Social Determinants of Health   Financial Resource Strain: Not on file  Food Insecurity: Not on file  Transportation Needs: Not on file  Physical Activity: Not on file  Stress: Not on file  Social Connections: Not on file  Intimate Partner Violence: Not on file     ROS- All systems are reviewed and negative except as per the HPI above.  Physical Exam: There were no vitals filed for this visit.  GEN- The patient is well appearing ***, alert and oriented x 3 today.  Head- normocephalic, atraumatic Eyes-  Sclera clear, conjunctiva pink Ears- hearing intact Oropharynx- clear Neck- supple  Lungs- Clear to ausculation bilaterally, normal work of breathing Heart- ***Regular rate and rhythm, no murmurs, rubs or gallops  GI- soft, NT, ND, + BS Extremities- no clubbing, cyanosis, or edema MS- no significant deformity or atrophy Skin- no rash or lesion Psych- euthymic mood, full affect Neuro- strength and sensation are intact  Wt Readings from Last 3 Encounters:  03/09/20 68.8 kg  01/30/20 71.5 kg  09/16/19 72.2 kg    EKG today demonstrates ***  Echo 03/05/20 demonstrated  1. Left ventricular ejection fraction, by estimation, is 50%. The left  ventricle has mildly decreased function. The left ventricle demonstrates  global hypokinesis. Left ventricular diastolic parameters are  indeterminate.  2. Right ventricular systolic function is normal. The right ventricular  size is normal. There is moderately elevated pulmonary artery systolic  pressure. The estimated right ventricular systolic pressure is  19.1 mmHg.  3. Left atrial size was mildly dilated.  4. A small pericardial effusion is present.  5. The mitral valve is normal in structure. Moderate mitral valve  regurgitation. No evidence of mitral stenosis.  6. Tricuspid valve regurgitation is moderate.  7. The aortic valve is tricuspid. Aortic valve regurgitation is mild. No  aortic stenosis is present.  8. The inferior vena cava is normal in size with greater than 50%  respiratory variability, suggesting right atrial pressure of 3 mmHg.  9. The patient was in atrial fibrillation.  Epic records are reviewed at length today  CHA2DS2-VASc Score = 4  The patient's score is based upon: CHF History: No HTN History: Yes Diabetes History: No Stroke History: No Vascular Disease History: No Age Score: 2 Gender Score: 1   {Confirm score is correct.  If not, click here to update score.  REFRESH note. :[id number]}   ASSESSMENT AND PLAN: 1. Persistent Atrial Fibrillation (ICD10:  I48.19) The patient's CHA2DS2-VASc score is 4, indicating a 4.8% annual risk of stroke.   S/p DCCV 03/09/20 *** Continue flecainide 100 mg BID Continue Eliquis 5 mg BID Continue Toprol 50 mg BID If she fails flecainide, amiodarone would be the second line option. Not felt to be a candidate for dofetilide 2/2 renal insufficiency or ablation.   2. Secondary Hypercoagulable State (HYQ65:  D68.69){Click to add to Prob List or Visit Dx  :[id number]} The patient is at significant risk for stroke/thromboembolism based upon her CHA2DS2-VASc Score of 4.  Continue Apixaban (Eliquis).   3. Chronic diastolic CHF No signs or symptoms of fluid overload today. ***   Follow up with [facility] later today. ***   Adline Peals PA-C [facility] [address] [place], [place] [address] [phone] 03/17/2020 10:02 AM

## 2020-03-17 NOTE — Progress Notes (Incomplete)
PCP: Betty Martinique Pulmonology: Dr. Elsworth Soho Cardiology: [name]  Kristen Gray is a 75 year old with history of depression, HTN, and Crohns disease, diastolic heart failure, and  PAF.   She had a Cardiolite in 4/17 that was normal.  Echo in 4/17 showed normal EF with mild-moderate AI, mild-moderate MR, moderate TR, and elevated PA pressure at 51 mmHg. PFTs in 9/17 were unremarkable and V/Q scan in 7/18 showed no acute or chronic PE.  She was evaluated in the pulmonary clinic and was sent here for evaluation for heart catheterization. There was no significant lung abnormality noted.  She was found to have mild OSA.  I had her do a right and left heart cath in 8/18.  This showed no significant CAD, borderline elevated filling pressures, and no significant pulmonary hypertension.    Admitted to [facility] . She was in A fib and had volume overload.    PMH: 1. Depression 2. Crohns disease s/p partial colectomy.  3. GERD 4. H/o CCY 5. HTN 6. Chronic exertional dyspnea:  - Echo (4/17): EF 55%, mild LVH, mild to moderate AI, mild to moderate MR, moderate TR, PASP 51 mmHg.  - Lexiscan Cardiolite (4/17): EF 50%, no ischemia/infarction.  - V/Q scan (7/18): no evidence for chronic PE.  - PFTs (9/17): Unremarkable.  - LHC/RHC (8/18): No significant CAD; mean RA 4, PA 35/9 mean 23, mean PCWP 16, CI 2.75.  7. OSA: Mild.  8. Osteoarthritis  SH: Lives alone in [place], widow.  No smoking or ETOH.  FH: Mother with rheumatic heart disease, by description she had mitral stenosis.  No other known heart disease.   ROS: All systems reviewed and negative except as per HPI.   Current Outpatient Medications  Medication Sig Dispense Refill  . acetaminophen (TYLENOL) 500 MG tablet Take 1,000 mg by mouth every 6 (six) hours as needed (FOR PAIN.).    Marland Kitchen albuterol (PROAIR HFA) 108 (90 Base) MCG/ACT inhaler Inhale 2 puffs into the lungs every 6 (six) hours as needed for wheezing or shortness of breath. 18 g 2  .  apixaban (ELIQUIS) 5 MG TABS tablet Take 1 tablet (5 mg total) by mouth 2 (two) times daily. 60 tablet 6  . balsalazide (COLAZAL) 750 MG capsule Take 2 capsules (1,500 mg total) by mouth 2 (two) times daily. 120 capsule 11  . Calcium Carbonate-Vitamin D (CALTRATE 600+D PO) Take 2 tablets by mouth daily.    . Cholecalciferol (VITAMIN D3) 50 MCG (2000 UT) capsule TAKE 1 CAPSULE BY MOUTH EVERY DAY (Patient taking differently: No sig reported) 30 capsule 3  . flecainide (TAMBOCOR) 100 MG tablet Take 1 tablet (100 mg total) by mouth every 12 (twelve) hours. 60 tablet 6  . folic acid (FOLVITE) 1 MG tablet TAKE 1 TABLET BY MOUTH EVERY DAY (Patient not taking: No sig reported) 30 tablet 3  . metoprolol succinate (TOPROL-XL) 50 MG 24 hr tablet Take 1 tablet (50 mg total) by mouth 2 (two) times daily. Take with or immediately following a meal. 60 tablet 6  . omeprazole (PRILOSEC) 20 MG capsule Take 1 capsule (20 mg total) by mouth daily before breakfast. 90 capsule 0  . Propylene Glycol (SYSTANE BALANCE) 0.6 % SOLN Place 1 drop into both eyes in the morning and at bedtime.    . traZODone (DESYREL) 100 MG tablet TAKE 1/2 TABLET BY MOUTH NIGHTLY AT BEDTIME (Patient taking differently: No sig reported) 45 tablet 1  . venlafaxine XR (EFFEXOR-XR) 75 MG 24 hr capsule  Take 2 capsules (150 mg total) by mouth daily with breakfast. 60 capsule 3  . vitamin B-12 (CYANOCOBALAMIN) 1000 MCG tablet Take 1 tablet (1,000 mcg total) by mouth daily. 30 tablet 3   No current facility-administered medications for this visit.   There were no vitals taken for this visit. General:  Well appearing. No resp difficulty HEENT: normal Neck: supple. no JVD. Carotids 2+ bilat; no bruits. No lymphadenopathy or thryomegaly appreciated. Cor: PMI nondisplaced. Regular rate & rhythm. No rubs, gallops or murmurs. Lungs: clear Abdomen: soft, nontender, nondistended. No hepatosplenomegaly. No bruits or masses. Good bowel sounds. Extremities:  no cyanosis, clubbing, rash, edema Neuro: alert & orientedx3, cranial nerves grossly intact. moves all 4 extremities w/o difficulty. Affect pleasant  Assessment/Plan: 1. Chronic exertional dyspnea:  Pulmonary evaluation has not shown any  definite significant abnormalities (PFTs unremarkable, V/Q negative).  She has mild OSA.  . LHC/RHC in 8/18 showed no significant coronary disease - High resolution CT chest to r/o ILD.  2. Obesity:  3. HTN: BP is controlled.  4.Depression: This certainly could play a role in her chronic fatigue. She is being treated for this.  5. A fib      Amy Clegg 03/17/2020

## 2020-03-17 NOTE — Telephone Encounter (Signed)
Patient's sister called stating pt is unable to come for hospital follow up visits today as she is unable to get out of bed. She is having multiple falls and "basically bedbound" since returning home from hospital.  Sister states this was not the patients baseline prior to recent hospitalization.  Recommended patient be transported back to the ER for further evaluation since multiple new medications started at last hospitalization and pt condition is declining. Sister states she lives in [place] but is trying by phone to convince patient to call EMS for assessment and transport.  Patient's sister states she will have to cancel appts for today and not sure when they can be rescheduled at this time.

## 2020-03-18 ENCOUNTER — Emergency Department (HOSPITAL_COMMUNITY): Payer: PPO

## 2020-03-18 ENCOUNTER — Encounter (HOSPITAL_COMMUNITY): Payer: Self-pay | Admitting: Emergency Medicine

## 2020-03-18 ENCOUNTER — Emergency Department (HOSPITAL_COMMUNITY)
Admission: EM | Admit: 2020-03-18 | Discharge: 2020-03-21 | Disposition: A | Discharge status: Home / Self Care | Payer: PPO | Attending: Emergency Medicine | Admitting: Emergency Medicine

## 2020-03-18 DIAGNOSIS — Z79899 Other long term (current) drug therapy: Secondary | ICD-10-CM | POA: Diagnosis not present

## 2020-03-18 DIAGNOSIS — Z20822 Contact with and (suspected) exposure to covid-19: Secondary | ICD-10-CM | POA: Insufficient documentation

## 2020-03-18 DIAGNOSIS — I10 Essential (primary) hypertension: Secondary | ICD-10-CM | POA: Diagnosis not present

## 2020-03-18 DIAGNOSIS — I6782 Cerebral ischemia: Secondary | ICD-10-CM | POA: Diagnosis not present

## 2020-03-18 DIAGNOSIS — R296 Repeated falls: Secondary | ICD-10-CM | POA: Diagnosis not present

## 2020-03-18 DIAGNOSIS — Z7901 Long term (current) use of anticoagulants: Secondary | ICD-10-CM | POA: Diagnosis not present

## 2020-03-18 DIAGNOSIS — S0990XA Unspecified injury of head, initial encounter: Secondary | ICD-10-CM | POA: Diagnosis not present

## 2020-03-18 DIAGNOSIS — R42 Dizziness and giddiness: Secondary | ICD-10-CM | POA: Diagnosis not present

## 2020-03-18 DIAGNOSIS — R2681 Unsteadiness on feet: Secondary | ICD-10-CM | POA: Insufficient documentation

## 2020-03-18 DIAGNOSIS — R0602 Shortness of breath: Secondary | ICD-10-CM | POA: Diagnosis not present

## 2020-03-18 DIAGNOSIS — I517 Cardiomegaly: Secondary | ICD-10-CM | POA: Diagnosis not present

## 2020-03-18 DIAGNOSIS — R531 Weakness: Secondary | ICD-10-CM | POA: Diagnosis not present

## 2020-03-18 DIAGNOSIS — R0902 Hypoxemia: Secondary | ICD-10-CM | POA: Diagnosis not present

## 2020-03-18 DIAGNOSIS — I4891 Unspecified atrial fibrillation: Secondary | ICD-10-CM | POA: Diagnosis not present

## 2020-03-18 LAB — CBC
HCT: 38.6 % (ref 36.0–46.0)
Hemoglobin: 11.9 g/dL — ABNORMAL LOW (ref 12.0–15.0)
MCH: 29.7 pg (ref 26.0–34.0)
MCHC: 30.8 g/dL (ref 30.0–36.0)
MCV: 96.3 fL (ref 80.0–100.0)
Platelets: 213 10*3/uL (ref 150–400)
RBC: 4.01 MIL/uL (ref 3.87–5.11)
RDW: 14.9 % (ref 11.5–15.5)
WBC: 13.9 10*3/uL — ABNORMAL HIGH (ref 4.0–10.5)
nRBC: 0.7 % — ABNORMAL HIGH (ref 0.0–0.2)

## 2020-03-18 LAB — URINALYSIS, ROUTINE W REFLEX MICROSCOPIC
Bacteria, UA: NONE SEEN
Bilirubin Urine: NEGATIVE
Glucose, UA: NEGATIVE mg/dL
Hgb urine dipstick: NEGATIVE
Ketones, ur: NEGATIVE mg/dL
Nitrite: NEGATIVE
Protein, ur: 30 mg/dL — AB
Specific Gravity, Urine: 1.025 (ref 1.005–1.030)
pH: 5 (ref 5.0–8.0)

## 2020-03-18 LAB — COMPREHENSIVE METABOLIC PANEL
ALT: 25 U/L (ref 0–44)
AST: 79 U/L — ABNORMAL HIGH (ref 15–41)
Albumin: 3.1 g/dL — ABNORMAL LOW (ref 3.5–5.0)
Alkaline Phosphatase: 102 U/L (ref 38–126)
Anion gap: 11 (ref 5–15)
BUN: 22 mg/dL (ref 8–23)
CO2: 29 mmol/L (ref 22–32)
Calcium: 9.3 mg/dL (ref 8.9–10.3)
Chloride: 99 mmol/L (ref 98–111)
Creatinine, Ser: 1.41 mg/dL — ABNORMAL HIGH (ref 0.44–1.00)
GFR, Estimated: 39 mL/min — ABNORMAL LOW (ref >60)
Glucose, Bld: 147 mg/dL — ABNORMAL HIGH (ref 70–99)
Potassium: 4.2 mmol/L (ref 3.5–5.1)
Sodium: 139 mmol/L (ref 135–145)
Total Bilirubin: 1.6 mg/dL — ABNORMAL HIGH (ref 0.3–1.2)
Total Protein: 6.2 g/dL — ABNORMAL LOW (ref 6.5–8.1)

## 2020-03-18 LAB — TROPONIN I (HIGH SENSITIVITY)
Troponin I (High Sensitivity): 102 ng/L (ref <18)
Troponin I (High Sensitivity): 112 ng/L (ref <18)

## 2020-03-18 LAB — CBG MONITORING, ED: Glucose-Capillary: 130 mg/dL — ABNORMAL HIGH (ref 70–99)

## 2020-03-18 IMAGING — DX DG CHEST 1V PORT
1 series · 1 of 1 positions shown · non-contrast
Comparison: [DATE].

CLINICAL DATA: Shortness of breath and weakness, multiple falls.

EXAM:
PORTABLE CHEST 1 VIEW

[chest]
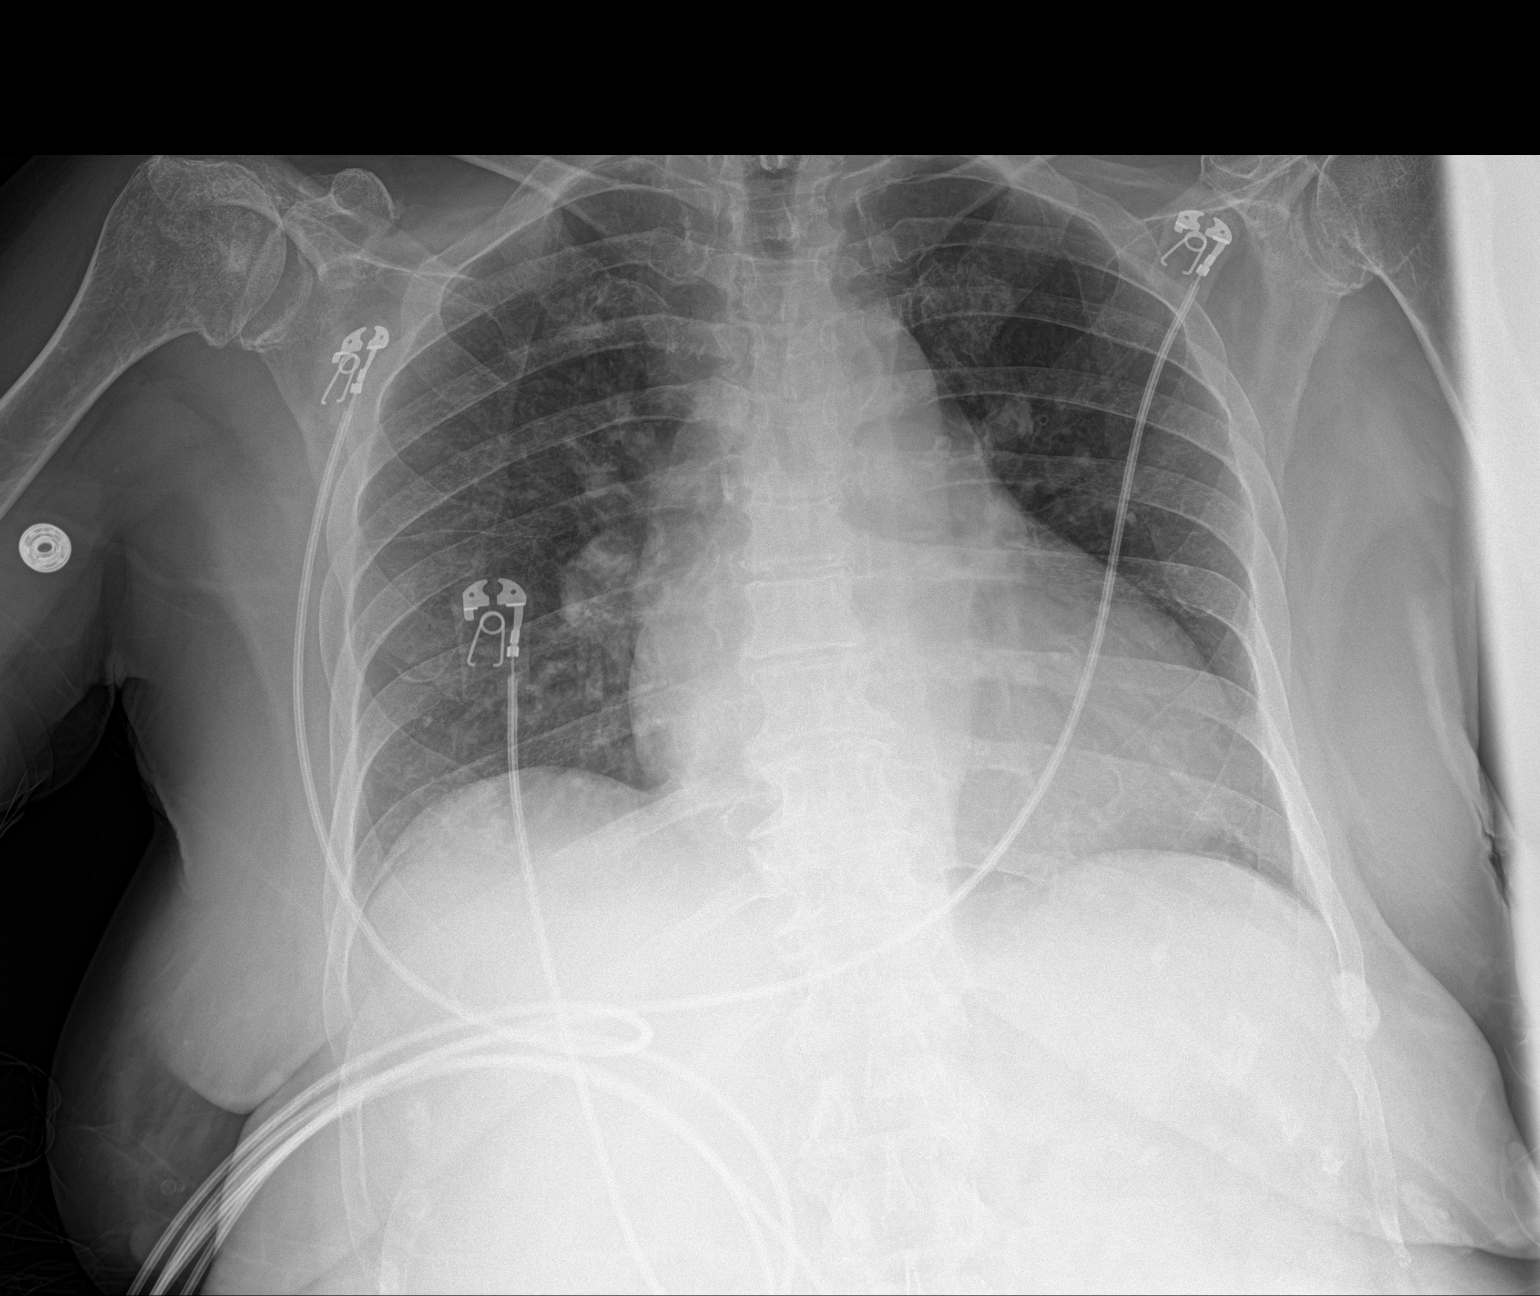

[1 of 1 positions shown; findings below may reference images not displayed]

FINDINGS: Trachea midline. Heart size remains enlarged with somewhat globular
appearance of the cardiac silhouette.

Central pulmonary vascular engorgement.a increased prominence of
RIGHT hilum with somewhat rounded appearance as compared to prior
imaging.

Lungs are clear.

No sign of effusion.

On limited assessment no acute skeletal process.
IMPRESSION: Cardiomegaly and central pulmonary vascular engorgement without
overt edema.

Convex appearance of RIGHT hilar contour, significance uncertain
perhaps related to vascular engorgement. Suggest follow-up PA and
lateral chest radiograph when the patient is able as an initial
means of excluding the possibility of RIGHT hilar nodal
enlargement.

## 2020-03-18 IMAGING — CT CT HEAD W/O CM
4 series · 16 of 47 positions shown, 18 images · non-contrast
Comparison: None.

CLINICAL DATA: Cerebral hemorrhage suspected. Multiple falls
recently hitting head.

EXAM:
CT HEAD WITHOUT CONTRAST
TECHNIQUE: Contiguous axial images were obtained from the base of the skull
through the vertex without intravenous contrast.

[Series 3: head bone · axial · 0.43mm/px · z∈[-97,-67]mm · 3 of 75 slices shown]
[im 8/75  bone]
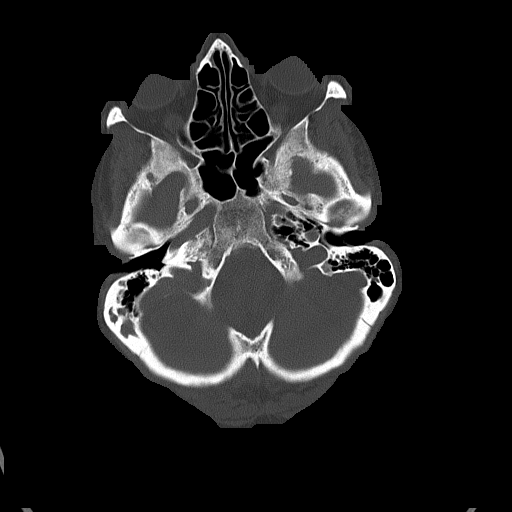
[im 15/75  bone]
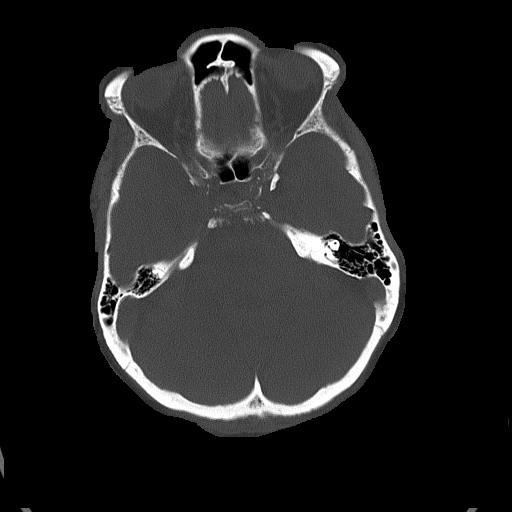
[im 23/75  bone]
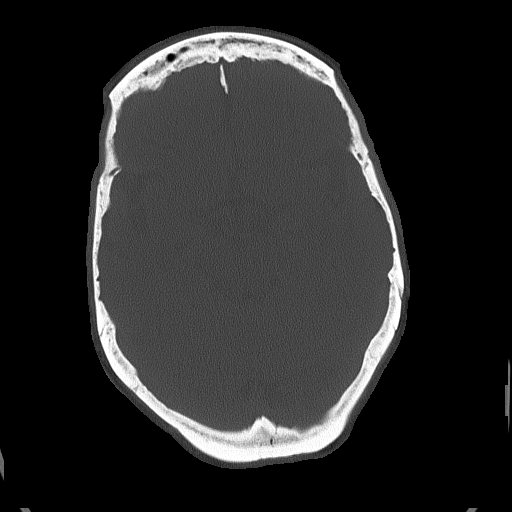

[Series 4: head without · axial · non-contrast · 0.43mm/px · z∈[-96,+14]mm · 7 of 30 slices shown, 9 images]
[im 4/30  brain]
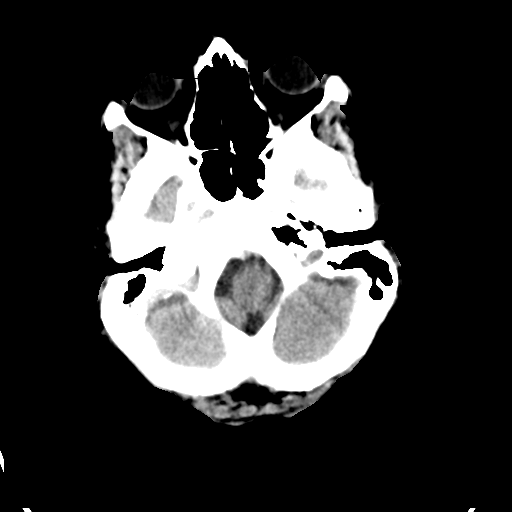
[im 4/30  bone]
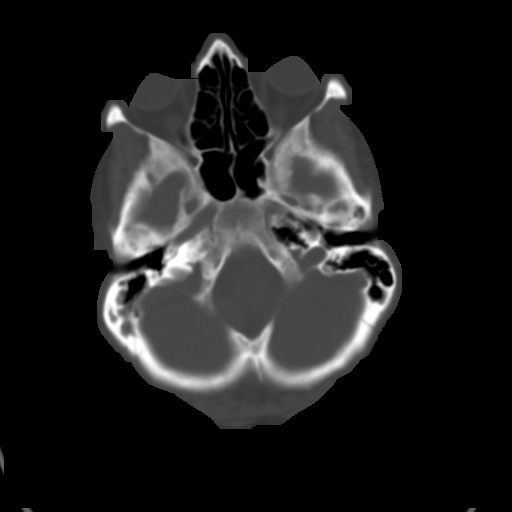
[im 8/30  brain]
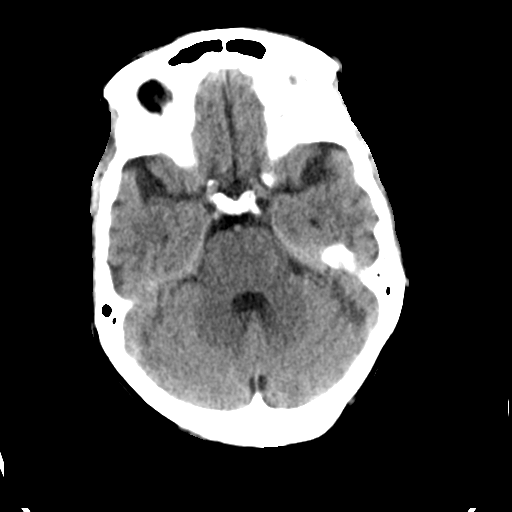
[im 11/30  brain]
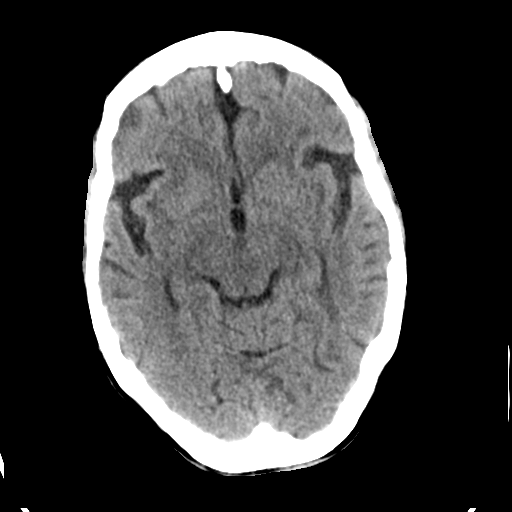
[im 15/30  brain]
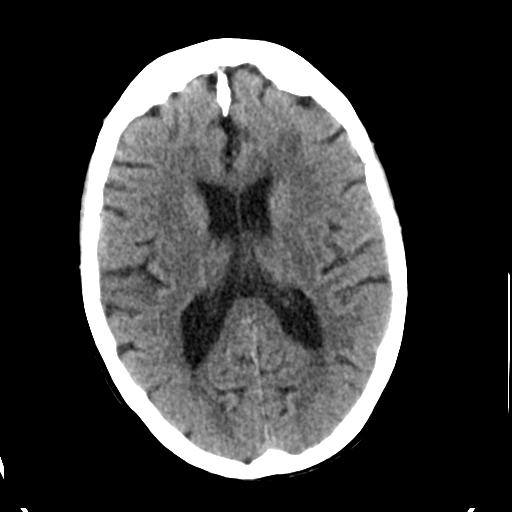
[im 19/30  brain]
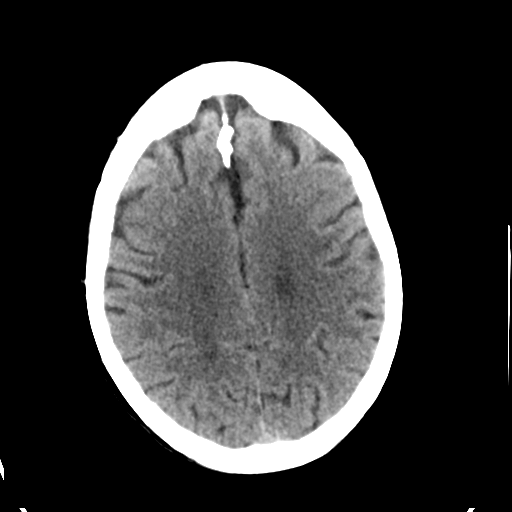
[im 19/30  bone]
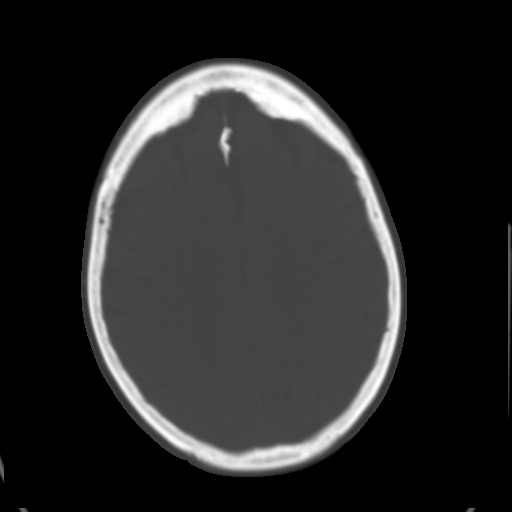
[im 22/30  brain]
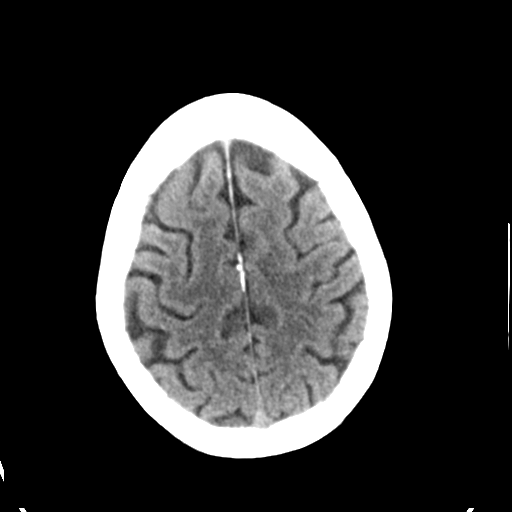
[im 26/30  brain]
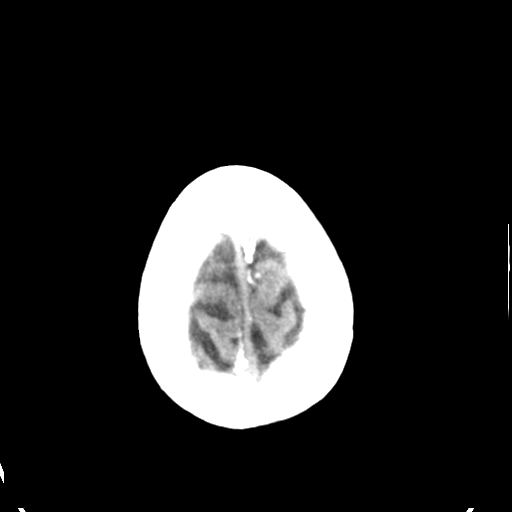

[Series 5: head without cor · coronal · non-contrast · 0.31mm/px · 3 of 66 slices shown]
[im 22/66  brain]
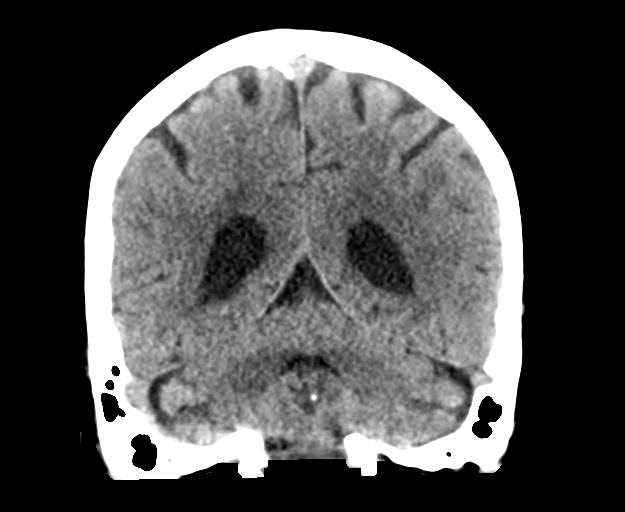
[im 29/66  brain]
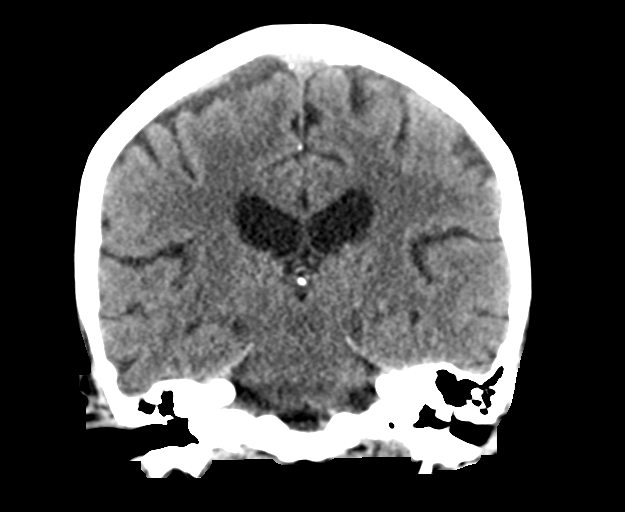
[im 37/66  brain]
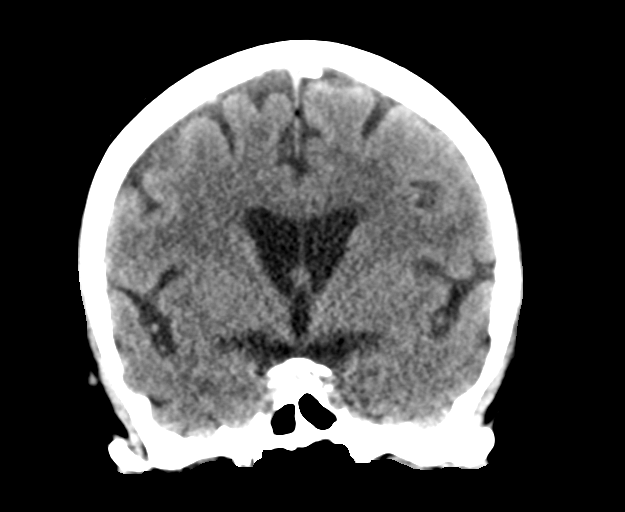

[Series 6: head without sag · sagittal · non-contrast · 0.32mm/px · 3 of 51 slices shown]
[im 17/51  brain]
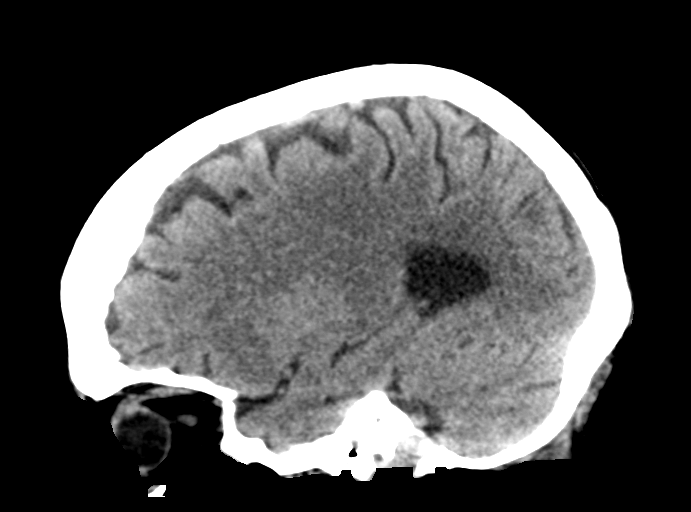
[im 26/51  brain]
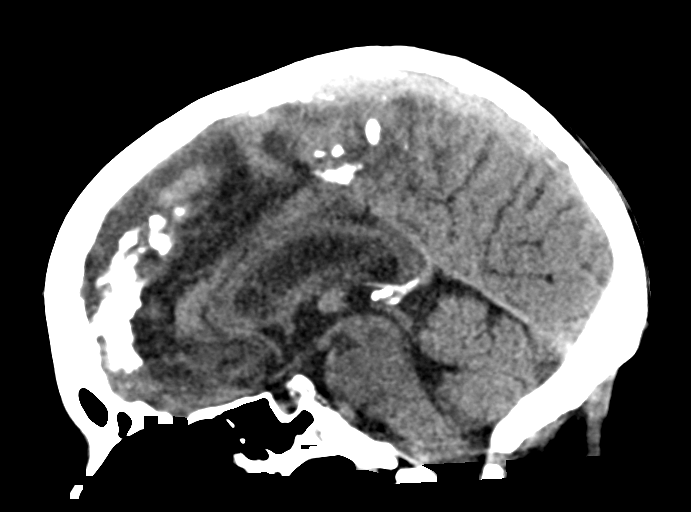
[im 34/51  brain]
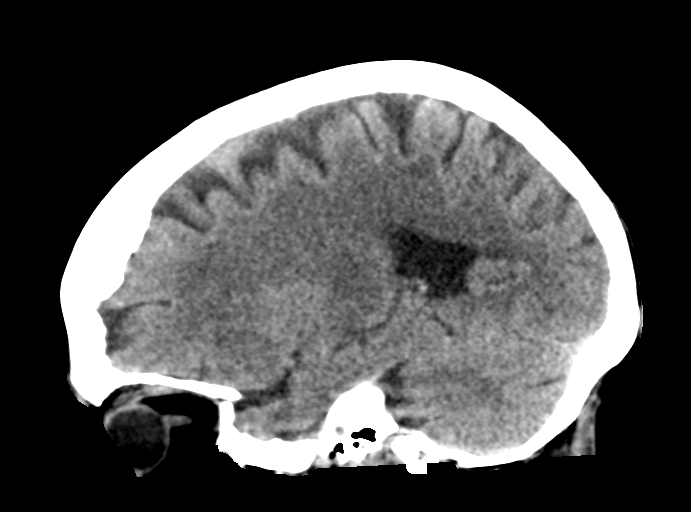

[16 of 47 positions shown; findings below may reference images not displayed]

FINDINGS: Brain: No evidence of acute infarction, hemorrhage, hydrocephalus,
extra-axial collection or mass lesion/mass effect. There is mild
diffuse low-attenuation within the subcortical and periventricular
white matter compatible with chronic microvascular disease.

Vascular: No hyperdense vessel or unexpected calcification.

Skull: Normal. Negative for fracture or focal lesion.

Sinuses/Orbits: No acute finding.

Other: None
IMPRESSION: 1. No acute intracranial abnormalities.
2. Chronic small vessel ischemic change.

## 2020-03-18 MED ORDER — ACETAMINOPHEN 500 MG PO TABS
1000.0000 mg | ORAL_TABLET | Freq: Four times a day (QID) | ORAL | Status: DC | PRN
  Administered 2020-03-20: 1000 mg via ORAL
  Filled 2020-03-18: qty 2

## 2020-03-18 MED ORDER — APIXABAN 5 MG PO TABS
5.0000 mg | ORAL_TABLET | Freq: Two times a day (BID) | ORAL | Status: DC
Start: 2020-03-18 — End: 2020-03-21
  Administered 2020-03-19 – 2020-03-20 (×3): 5 mg via ORAL
  Filled 2020-03-18 (×4): qty 1

## 2020-03-18 MED ORDER — PANTOPRAZOLE SODIUM 40 MG PO TBEC
40.0000 mg | DELAYED_RELEASE_TABLET | Freq: Every day | ORAL | Status: DC
  Administered 2020-03-19 – 2020-03-20 (×2): 40 mg via ORAL
  Filled 2020-03-18 (×2): qty 1

## 2020-03-18 MED ORDER — FLECAINIDE ACETATE 50 MG PO TABS
50.0000 mg | ORAL_TABLET | Freq: Two times a day (BID) | ORAL | Status: DC
  Administered 2020-03-19 (×2): 50 mg via ORAL
  Filled 2020-03-18 (×5): qty 1

## 2020-03-18 MED ORDER — LORAZEPAM 1 MG PO TABS: 1.0000 mg | ORAL_TABLET | Freq: Once | ORAL | Status: DC

## 2020-03-18 MED ORDER — HALOPERIDOL LACTATE 5 MG/ML IJ SOLN
5.0000 mg | Freq: Once | INTRAMUSCULAR | Status: AC
  Administered 2020-03-18: 5 mg via INTRAMUSCULAR
  Filled 2020-03-18: qty 1

## 2020-03-18 MED ORDER — ALBUTEROL SULFATE HFA 108 (90 BASE) MCG/ACT IN AERS: 2.0000 | INHALATION_SPRAY | Freq: Four times a day (QID) | RESPIRATORY_TRACT | Status: DC | PRN

## 2020-03-18 MED ORDER — METOPROLOL SUCCINATE ER 25 MG PO TB24
50.0000 mg | ORAL_TABLET | Freq: Two times a day (BID) | ORAL | Status: DC
  Administered 2020-03-18 – 2020-03-20 (×4): 50 mg via ORAL
  Filled 2020-03-18 (×5): qty 2

## 2020-03-18 MED ORDER — TRAZODONE HCL 100 MG PO TABS
100.0000 mg | ORAL_TABLET | Freq: Every day | ORAL | Status: DC
  Administered 2020-03-18 – 2020-03-19 (×2): 100 mg via ORAL
  Filled 2020-03-18 (×3): qty 1

## 2020-03-18 MED ORDER — BALSALAZIDE DISODIUM 750 MG PO CAPS
1500.0000 mg | ORAL_CAPSULE | Freq: Two times a day (BID) | ORAL | Status: DC
  Administered 2020-03-19 (×2): 1500 mg via ORAL
  Filled 2020-03-18 (×5): qty 2

## 2020-03-18 MED ORDER — VITAMIN B-12 1000 MCG PO TABS
1000.0000 ug | ORAL_TABLET | Freq: Every day | ORAL | Status: DC
  Administered 2020-03-19 – 2020-03-20 (×2): 1000 ug via ORAL
  Filled 2020-03-18 (×2): qty 1

## 2020-03-18 MED ORDER — FLECAINIDE ACETATE 100 MG PO TABS
100.0000 mg | ORAL_TABLET | Freq: Two times a day (BID) | ORAL | Status: DC
  Filled 2020-03-18: qty 1

## 2020-03-18 MED ORDER — VENLAFAXINE HCL ER 75 MG PO CP24
75.0000 mg | ORAL_CAPSULE | Freq: Every day | ORAL | Status: DC
  Administered 2020-03-18 – 2020-03-20 (×3): 75 mg via ORAL
  Filled 2020-03-18 (×3): qty 1

## 2020-03-18 NOTE — ED Notes (Signed)
Pt very agitated, pulled out IV, yelling that she wants to go home; Dr. Zenia Resides notified

## 2020-03-18 NOTE — Progress Notes (Signed)
TOC team met with Pt at bedside.  Pt was calm and polite. Pt discussed her dissatisfaction with meatloaf as a meal.  CSW called Pt's sister, Stanton Kidney @ [phone] to gather collateral information. Sister states that Pt was expecting services through [facility] on Monday and Tuesday but that no one arrived. Per sister, Pt has a neighbor who has been assisting with medications. Sister requested resources for hiring housekeeping and other in-home assistance. CSW gave resources for [employer].  TOC team will continue to follow.

## 2020-03-18 NOTE — ED Notes (Signed)
Pt is shouting for help, and that she needs to go home, pt still refusing to keep monitor on.

## 2020-03-18 NOTE — ED Notes (Signed)
Pt refuse to have vital signs taken at this time.

## 2020-03-18 NOTE — ED Notes (Addendum)
Pt refusing to keep monitoring equipment on.

## 2020-03-18 NOTE — ED Provider Notes (Signed)
[facility] EMERGENCY DEPARTMENT Provider Note   CSN: [id number] Arrival date & time: 03/18/20  1220     History Chief Complaint  Patient presents with  . Weakness    Kristen Gray is a 75 y.o. female.  75 year old female presents with weakness x9 days.  Patient states that she was diagnosed with A. fib at that time and has been on Eliquis and a rate control medication.  Patient states that since treatment medication that she has had diffuse weakness and multiple falls.  Denies any LOC or head injury.  Has any headaches.  Denies any black or bloody stools.  Has not been short of breath has not had any abdominal discomfort.  Denies any urinary symptoms.  No vomiting or diarrhea.  No treatment for this prior to arrival        Past Medical History:  Diagnosis Date  . Anemia   . Anxiety   . Arthritis   . Crohn's disease ([place])   . Depression   . GERD (gastroesophageal reflux disease)   . Heart murmur   . Hypertension   . Reflux     Patient Active Problem List   Diagnosis Date Noted  . Atrial fibrillation with RVR ([place]) 03/05/2020  . Atrial fibrillation ([place]) 03/05/2020  . AKI (acute kidney injury) ([place])   . Moderate episode of recurrent major depressive disorder ([place]) 09/16/2019  . Crohn disease ([place]) 02/25/2019  . Chronic fatigue 02/25/2019  . Vitamin B 12 deficiency 02/25/2019  . Unilateral primary osteoarthritis, left knee 03/27/2018  . Hypokalemia 02/20/2018  . GERD (gastroesophageal reflux disease) 07/28/2017  . Unilateral primary osteoarthritis, right knee 01/31/2017  . Sebaceous cyst 10/21/2016  . Anemia 10/20/2016  . Primary osteoarthritis of right hip 09/12/2016  . Pulmonary hypertension ([place]) 07/26/2016  . OSA (obstructive sleep apnea) 07/26/2016  . Morbid obesity ([place]) 04/14/2016  . Hypertension, essential 04/13/2016  . Vitamin D deficiency 04/13/2016  . Insomnia 04/13/2016  . Generalized anxiety disorder 04/13/2016  . Major  depression 04/13/2016  . Osteoarthritis, 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/13/2016  . Dyspnea 10/07/2015    Past Surgical History:  Procedure Laterality Date  . ANKLE SURGERY    . CARDIOVERSION N/A 03/05/2020   Procedure: CARDIOVERSION;  Surgeon: Larey Dresser, MD;  Location: [facility] ENDOSCOPY;  Service: Cardiovascular;  Laterality: N/A;  . CARDIOVERSION N/A 03/09/2020   Procedure: CARDIOVERSION;  Surgeon: Larey Dresser, MD;  Location: [facility] ENDOSCOPY;  Service: Cardiovascular;  Laterality: N/A;  . CARPAL TUNNEL RELEASE    . CHOLECYSTECTOMY    . COLON SURGERY    . RIGHT/LEFT HEART CATH AND CORONARY ANGIOGRAPHY N/A 09/15/2016   Procedure: RIGHT/LEFT HEART CATH AND CORONARY ANGIOGRAPHY;  Surgeon: Larey Dresser, MD;  Location: [facility] CV LAB;  Service: Cardiovascular;  Laterality: N/A;  . TEE WITHOUT CARDIOVERSION N/A 03/05/2020   Procedure: TRANSESOPHAGEAL ECHOCARDIOGRAM (TEE);  Surgeon: Larey Dresser, MD;  Location: [facility] ENDOSCOPY;  Service: Cardiovascular;  Laterality: N/A;     OB History   No obstetric history on file.     Family History  Problem Relation Age of Onset  . Heart disease Mother        RF  . Stroke Father   . Diabetes Father   . Hyperlipidemia Father   . Hypertension Father     Social History   Tobacco Use  . Smoking status: Never Smoker  . Smokeless tobacco: Never Used  Vaping Use  . Vaping Use: Never used  Substance Use Topics  .  Alcohol use: No  . Drug use: No    Home Medications Prior to Admission medications   Medication Sig Start Date End Date Taking? Authorizing Provider  acetaminophen (TYLENOL) 500 MG tablet Take 1,000 mg by mouth every 6 (six) hours as needed (FOR PAIN.).    [provider]  albuterol (PROAIR HFA) 108 (90 Base) MCG/ACT inhaler Inhale 2 puffs into the lungs every 6 (six) hours as needed for wheezing or shortness of breath. 11/08/19   Martinique, Betty G, MD  apixaban (ELIQUIS) 5 MG TABS tablet Take 1 tablet (5 mg total) by mouth 2 (two)  times daily. 03/09/20   Clegg, Amy D, NP  balsalazide (COLAZAL) 750 MG capsule Take 2 capsules (1,500 mg total) by mouth 2 (two) times daily. 01/30/20   Zehr, Laban Emperor, PA-C  Calcium Carbonate-Vitamin D (CALTRATE 600+D PO) Take 2 tablets by mouth daily.    [provider]  Cholecalciferol (VITAMIN D3) 50 MCG (2000 UT) capsule TAKE 1 CAPSULE BY MOUTH EVERY DAY Patient taking differently: No sig reported 01/20/20   Martinique, Betty G, MD  flecainide (TAMBOCOR) 100 MG tablet Take 1 tablet (100 mg total) by mouth every 12 (twelve) hours. 03/09/20   Clegg, Amy D, NP  folic acid (FOLVITE) 1 MG tablet TAKE 1 TABLET BY MOUTH EVERY DAY Patient not taking: No sig reported 12/13/19   Martinique, Betty G, MD  metoprolol succinate (TOPROL-XL) 50 MG 24 hr tablet Take 1 tablet (50 mg total) by mouth 2 (two) times daily. Take with or immediately following a meal. 03/09/20   Clegg, Amy D, NP  omeprazole (PRILOSEC) 20 MG capsule Take 1 capsule (20 mg total) by mouth daily before breakfast. 07/02/19   Martinique, Betty G, MD  Propylene Glycol (SYSTANE BALANCE) 0.6 % SOLN Place 1 drop into both eyes in the morning and at bedtime.    [provider]  traZODone (DESYREL) 100 MG tablet TAKE 1/2 TABLET BY MOUTH NIGHTLY AT BEDTIME Patient taking differently: No sig reported 02/04/20   Martinique, Betty G, MD  venlafaxine XR (EFFEXOR-XR) 75 MG 24 hr capsule Take 2 capsules (150 mg total) by mouth daily with breakfast. 02/04/20   Martinique, Betty G, MD  vitamin B-12 (CYANOCOBALAMIN) 1000 MCG tablet Take 1 tablet (1,000 mcg total) by mouth daily. 01/20/20   Martinique, Betty G, MD    Allergies    Penicillin g  Review of Systems   Review of Systems  All other systems reviewed and are negative.   Physical Exam Updated Vital Signs BP (!) 155/63 (BP Location: Right Arm)   Pulse (!) 58   Temp 98.9 F (37.2 C)   Resp 19   SpO2 100%   Physical Exam Vitals and nursing note reviewed.  Constitutional:      General: She is not in  acute distress.    Appearance: Normal appearance. She is well-developed and well-nourished. She is not toxic-appearing.  HENT:     Head: Normocephalic and atraumatic.  Eyes:     General: Lids are normal.     Extraocular Movements: EOM normal.     Conjunctiva/sclera: Conjunctivae normal.     Pupils: Pupils are equal, round, and reactive to light.  Neck:     Thyroid: No thyroid mass.     Trachea: No tracheal deviation.  Cardiovascular:     Rate and Rhythm: Normal rate and regular rhythm.     Heart sounds: Normal heart sounds. No murmur heard. No gallop.   Pulmonary:  Effort: Pulmonary effort is normal. No respiratory distress.     Breath sounds: Normal breath sounds. No stridor. No decreased breath sounds, wheezing, rhonchi or rales.  Abdominal:     General: Bowel sounds are normal. There is no distension.     Palpations: Abdomen is soft.     Tenderness: There is no abdominal tenderness. There is no CVA tenderness or rebound.  Musculoskeletal:        General: No tenderness or edema. Normal range of motion.     Cervical back: Normal range of motion and neck supple.  Skin:    General: Skin is warm and dry.     Findings: No abrasion or rash.  Neurological:     General: No focal deficit present.     Mental Status: She is alert and oriented to person, place, and time.     GCS: GCS eye subscore is 4. GCS verbal subscore is 5. GCS motor subscore is 6.     Cranial Nerves: No cranial nerve deficit.     Sensory: No sensory deficit.     Deep Tendon Reflexes: Strength normal.  Psychiatric:        Mood and Affect: Mood and affect normal.        Speech: Speech normal.        Behavior: Behavior normal.     ED Results / Procedures / Treatments   Labs (all labs ordered are listed, but only abnormal results are displayed) Labs Reviewed  CBC  URINALYSIS, ROUTINE W REFLEX MICROSCOPIC  COMPREHENSIVE METABOLIC PANEL  CBG MONITORING, ED  TROPONIN I (HIGH SENSITIVITY)    EKG EKG  Interpretation  Date/Time:  Wednesday March 18 2020 13:26:40 EST Ventricular Rate:  56 PR Interval:    QRS Duration: 114 QT Interval:  510 QTC Calculation: 493 R Axis:   76 Text Interpretation: Sinus rhythm Borderline prolonged PR interval Borderline intraventricular conduction delay Borderline T abnormalities, inferior leads Borderline prolonged QT interval Confirmed by Lacretia Leigh ([id number]) on 03/18/2020 1:39:05 PM   Radiology No results found.  Procedures Procedures   Medications Ordered in ED Medications - No data to display  ED Course  I have reviewed the triage vital signs and the nursing notes.  Pertinent labs & imaging results that were available during my care of the patient were reviewed by me and considered in my medical decision making (see chart for details).    MDM Rules/Calculators/A&P                          Patient with elevated troponin but this appeared to be chronic in nature.  Discussed with cardiology who agrees that no further work-up is to be done with this.  Head CT without acute findings here.  Creatinine is chronically elevated.  Patient requesting to be discharged and will discharge Final Clinical Impression(s) / ED Diagnoses Final diagnoses:  None    Rx / DC Orders ED Discharge Orders    None       Lacretia Leigh, MD 03/18/20 1605

## 2020-03-18 NOTE — ED Provider Notes (Signed)
Patient agitated and combative, swinging at staff. Patient yelling "you're not going to keep me here, buddy!". Will administer haldol to keep her from hitting staff.    Orpah Greek, MD 03/18/20 [phone]

## 2020-03-18 NOTE — ED Triage Notes (Addendum)
Patient BIB [facility] from home for evaluation of weakness and multiple falls(patient denies hitting her head) after being diagnosed with new onset afib on 03/09/2020 and was started on antiarrythmic and eliquis. Patient had a followup appointment with cardiologist yesterday but did not go because she felt too weak. Patient alert and oriented and in no apparent distress at this time.

## 2020-03-18 NOTE — ED Notes (Signed)
Offered pt sandwich and water.

## 2020-03-18 NOTE — ED Notes (Addendum)
Found pt naked in room. Pt took off her hospital gown and arm band. Pt very argumentative, shouting and refusing instructions.   Pt refusing to put gown on and states "I need to get my pants on, I am going home"  Pt also started swinging both arms towards this RN, hitting both this RNs arms. Pt also spilled water on herself and refuses to be wiped/ cleaned by this RN.  Dr. Betsey Holiday made aware of current situation.

## 2020-03-18 NOTE — ED Notes (Signed)
PT ripped off blood pressure cuff, oxygen sat device, as well as ekg leads. Pt refuses to put them on and is repeatedly shouting she wants to go home. Pt shouting that she can live alone and does not need to be placed in a facility.
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
## 2020-03-19 MED ORDER — HALOPERIDOL 1 MG PO TABS
2.0000 mg | ORAL_TABLET | Freq: Once | ORAL | Status: AC
  Administered 2020-03-19: 2 mg via ORAL
  Filled 2020-03-19: qty 2

## 2020-03-19 MED ORDER — HALOPERIDOL LACTATE 5 MG/ML IJ SOLN
5.0000 mg | Freq: Once | INTRAMUSCULAR | Status: AC
  Administered 2020-03-19: 5 mg via INTRAMUSCULAR
  Filled 2020-03-19: qty 1
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
## 2020-03-19 NOTE — ED Notes (Signed)
pts belongings placed in bag and secured in Purple zone locker #4.

## 2020-03-19 NOTE — ED Notes (Signed)
Pt is still angry at every statement made or with any attempt to position for comfort

## 2020-03-19 NOTE — ED Notes (Signed)
Pt yelling again that she wants to go home, pt remains confused and reoriented to surroundings

## 2020-03-19 NOTE — ED Notes (Signed)
Pt screaming out of room again, taking all blankets and sheets off

## 2020-03-19 NOTE — Discharge Planning (Signed)
RNCM consulted regarding safe discharge planning (Home with [facility] Placement).  Physical Therapy evaluation placed; will follow up after recommendations from PT.

## 2020-03-19 NOTE — Progress Notes (Signed)
CSW received a call from Shara Blazing at [facility]. Mr. Kristen Gray stated that the sister had plans of filing POA paperwork. CSW asked about the process of guardianship. CSW stated that the sister could file or the department. Kristen Gray stated he would have to speak with his supervisor about that. Kristen Gray told CSW that he would update her after he speaks with the sister.

## 2020-03-19 NOTE — Progress Notes (Signed)
CSW contacted Kristen Gray from [facility] who is working with patient. CSW also called Kristen Gray the program manager at [facility]. The voicemail for Mr. Kristen Gray and Mr. Kristen Gray says they will return phone calls within 48 hours. CSW left a message for both.

## 2020-03-19 NOTE — ED Notes (Signed)
Pt refused vital signs taking again at this time.

## 2020-03-19 NOTE — Progress Notes (Signed)
CSW faxed referral to several area SNFs.  PASRR still pending

## 2020-03-19 NOTE — ED Notes (Signed)
Breakfast ordered 

## 2020-03-19 NOTE — ED Notes (Signed)
Pt crying again, stating we are going to leave her here all alone. Pt throwing food on floor. Pt re oriented to surroundings

## 2020-03-19 NOTE — ED Provider Notes (Signed)
Emergency Medicine Observation Re-evaluation Note  Kristen Gray is a 75 y.o. female, seen on rounds today.  Pt initially presented to the ED for complaints of Weakness Currently, the patient is resting quietly in bed.  Physical Exam  BP (!) 169/90 (BP Location: Right Arm)   Pulse (!) 56   Temp 97.8 F (36.6 C) (Axillary)   Resp 16   Ht 4' 10"  (1.473 m)   Wt 68.8 kg   SpO2 96%   BMI 31.70 kg/m  Physical Exam General: No acute distress Cardiac: Well-perfused Lungs: Nonlabored Psych: Currently calm  ED Course / MDM  EKG:EKG Interpretation  Date/Time:  Wednesday March 18 2020 13:26:40 EST Ventricular Rate:  56 PR Interval:    QRS Duration: 114 QT Interval:  510 QTC Calculation: 493 R Axis:   76 Text Interpretation: Sinus rhythm Borderline prolonged PR interval Borderline intraventricular conduction delay Borderline T abnormalities, inferior leads Borderline prolonged QT interval Confirmed by Lacretia Leigh ([id number]) on 03/18/2020 1:39:05 PM    I have reviewed the labs performed to date as well as medications administered while in observation.  Recent changes in the last 24 hours include agitation last night that required medication.  Plan  Current plan is for Transitions of care helping for safe transitional care plan. Patient is not under full IVC at this time.   Hayden Rasmussen, MD 03/20/20 [phone]

## 2020-03-19 NOTE — NC FL2 (Signed)
[place] LEVEL OF CARE SCREENING TOOL     IDENTIFICATION  Patient Name: Kristen Gray Birthdate: 1945/07/20 Sex: female Admission Date (Current Location): 03/18/2020  [facility] and [place] Number:  Herbalist and Address:  [facility], [place] [address] [place], [place] [address]      Provider Number: [id number]  Attending Physician Name and Address:  Default, Provider, MD  Relative Name and Phone Number:  [name], [phone]    Current Level of Care: Hospital Recommended Level of Care: [place] Prior Approval Number:    Date Approved/Denied:   PASRR Number: Pending  Discharge Plan: SNF    Current Diagnoses: Patient Active Problem List   Diagnosis Date Noted  . Atrial fibrillation with RVR ([place]) 03/05/2020  . Atrial fibrillation ([place]) 03/05/2020  . AKI (acute kidney injury) ([place])   . Moderate episode of recurrent major depressive disorder ([place]) 09/16/2019  . Crohn disease ([place]) 02/25/2019  . Chronic fatigue 02/25/2019  . Vitamin B 12 deficiency 02/25/2019  . Unilateral primary osteoarthritis, left knee 03/27/2018  . Hypokalemia 02/20/2018  . GERD (gastroesophageal reflux disease) 07/28/2017  . Unilateral primary osteoarthritis, right knee 01/31/2017  . Sebaceous cyst 10/21/2016  . Anemia 10/20/2016  . Primary osteoarthritis of right hip 09/12/2016  . Pulmonary hypertension ([place]) 07/26/2016  . OSA (obstructive sleep apnea) 07/26/2016  . Morbid obesity ([place]) 04/14/2016  . Hypertension, essential 04/13/2016  . Vitamin D deficiency 04/13/2016  . Insomnia 04/13/2016  . Generalized anxiety disorder 04/13/2016  . 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 Osteoarthritis, 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/13/2016  . Dyspnea 10/07/2015    Orientation RESPIRATION BLADDER Height & Weight     Self,Situation,Place  Normal Incontinent Weight: 151 lb 10.8 oz (68.8 kg) Height:  4' 10"  (147.3 cm)  BEHAVIORAL SYMPTOMS/MOOD  NEUROLOGICAL BOWEL NUTRITION STATUS      Incontinent Diet (Heart Healthy)  AMBULATORY STATUS COMMUNICATION OF NEEDS Skin   Extensive Assist Verbally Normal                       Personal Care Assistance Level of Assistance  Bathing,Feeding,Dressing Bathing Assistance: Limited assistance Feeding assistance: Independent Dressing Assistance: Maximum assistance     Functional Limitations Info  Speech,Hearing,Sight Sight Info: Adequate Hearing Info: Adequate Speech Info: Adequate    SPECIAL CARE FACTORS FREQUENCY  PT (By licensed PT),OT (By licensed OT)     PT Frequency: 5x weekly OT Frequency: 5x weekly            Contractures Contractures Info: Not present    Additional Factors Info  Code Status,Allergies Code Status Info: Full Allergies Info: Penicillin           Current Medications (03/19/2020):  This is the current hospital active medication list Current Facility-Administered Medications  Medication Dose Route Frequency Provider Last Rate Last Admin  . acetaminophen (TYLENOL) tablet 1,000 mg  1,000 mg Oral Q6H PRN Noemi Chapel, MD      . albuterol (VENTOLIN HFA) 108 (90 Base) MCG/ACT inhaler 2 puff  2 puff Inhalation Q6H PRN Noemi Chapel, MD      . apixaban Arne Cleveland) tablet 5 mg  5 mg Oral BID Noemi Chapel, MD   5 mg at 03/19/20 0920  . balsalazide (COLAZAL) capsule 1,500 mg  1,500 mg Oral BID Noemi Chapel, MD   1,500 mg at 03/19/20 1209  . flecainide (TAMBOCOR) tablet 50 mg  50 mg Oral Q12H Noemi Chapel, MD   50 mg at  03/19/20 0920  . metoprolol succinate (TOPROL-XL) 24 hr tablet 50 mg  50 mg Oral BID Noemi Chapel, MD   50 mg at 03/19/20 0920  . pantoprazole (PROTONIX) EC tablet 40 mg  40 mg Oral Daily Noemi Chapel, MD   40 mg at 03/19/20 0919  . traZODone (DESYREL) tablet 100 mg  100 mg Oral QHS Noemi Chapel, MD   100 mg at 03/18/20 2329  . venlafaxine XR (EFFEXOR-XR) 24 hr capsule 75 mg  75 mg Oral Q breakfast Noemi Chapel, MD   75 mg at 03/19/20  0748  . vitamin B-12 (CYANOCOBALAMIN) tablet 1,000 mcg  1,000 mcg Oral Daily Noemi Chapel, MD   1,000 mcg at 03/19/20 3762   Current Outpatient Medications  Medication Sig Dispense Refill  . acetaminophen (TYLENOL) 500 MG tablet Take 1,000 mg by mouth every 6 (six) hours as needed (FOR PAIN.).    Marland Kitchen albuterol (PROAIR HFA) 108 (90 Base) MCG/ACT inhaler Inhale 2 puffs into the lungs every 6 (six) hours as needed for wheezing or shortness of breath. 18 g 2  . apixaban (ELIQUIS) 5 MG TABS tablet Take 1 tablet (5 mg total) by mouth 2 (two) times daily. 60 tablet 6  . balsalazide (COLAZAL) 750 MG capsule Take 2 capsules (1,500 mg total) by mouth 2 (two) times daily. 120 capsule 11  . Calcium Carbonate-Vitamin D (CALTRATE 600+D PO) Take 2 tablets by mouth daily.    . Cholecalciferol (VITAMIN D3) 50 MCG (2000 UT) capsule TAKE 1 CAPSULE BY MOUTH EVERY DAY (Patient taking differently: Take 2,000 Units by mouth daily.) 30 capsule 3  . flecainide (TAMBOCOR) 100 MG tablet Take 1 tablet (100 mg total) by mouth every 12 (twelve) hours. 60 tablet 6  . metoprolol succinate (TOPROL-XL) 50 MG 24 hr tablet Take 1 tablet (50 mg total) by mouth 2 (two) times daily. Take with or immediately following a meal. 60 tablet 6  . omeprazole (PRILOSEC) 20 MG capsule Take 1 capsule (20 mg total) by mouth daily before breakfast. 90 capsule 0  . traZODone (DESYREL) 100 MG tablet TAKE 1/2 TABLET BY MOUTH NIGHTLY AT BEDTIME (Patient taking differently: Take 100 mg by mouth at bedtime.) 45 tablet 1  . venlafaxine XR (EFFEXOR-XR) 75 MG 24 hr capsule Take 2 capsules (150 mg total) by mouth daily with breakfast. (Patient taking differently: Take 75 mg by mouth daily with breakfast.) 60 capsule 3  . vitamin B-12 (CYANOCOBALAMIN) 1000 MCG tablet Take 1 tablet (1,000 mcg total) by mouth daily. 30 tablet 3     Discharge Medications: Please see discharge summary for a list of discharge medications.  Relevant Imaging Results:  Relevant  Lab Results:   Additional Information SSN# [id number] /  Alphonsa Overall Vaccine 06/11/19  Juaquin Ludington, LCSW

## 2020-03-19 NOTE — Progress Notes (Signed)
Patient spoke with her sister Stanton Kidney and patient agreed to go to SNF

## 2020-03-19 NOTE — Evaluation (Signed)
Physical Therapy Evaluation Patient Details Name: Kristen Gray MRN: [id number] DOB: 05/22/1945 Today's Date: 03/19/2020   History of Present Illness  75yo female presenting to the ED for eval of weakness and falls, also with recent history of new A-fib s/p cardioversion done 03/09/20. PMH HTN, ankle surgery, colon surgery, cardiac cath  Clinical Impression   Patient received in ED stretcher, sleeping and perseverating on telling me "I'm sleepy!". A&O to self only, and unable to tell me where she is/why she is here, but question if some of this is her simply not wanting to engage with therapy. Only able to convince her to participate in limited B LE MMT today with gross strength in supine about 3+ to 4/5 symmetrically. Adamantly refused EOB/OOB or even rolling and became more and more agitated with PT efforts- ultimately told me "I want to go to a real rehab", when questioned what this means she stated "one that's not this place/not you". Left in ED stretcher with all needs met, RN aware of patient status. Due to multiple falls at home as well as impaired awareness of safety/deficits today, will definitely need SNF and 24/7A moving forward.     Follow Up Recommendations SNF;Supervision/Assistance - 24 hour    Equipment Recommendations  Rolling walker with 5" wheels;3in1 (PT);Wheelchair (measurements PT);Wheelchair cushion (measurements PT)    Recommendations for Other Services       Precautions / Restrictions Precautions Precautions: Fall;Other (comment) Precaution Comments: watch HR (recent cardioversion), agitated and uncooperative Restrictions Weight Bearing Restrictions: No      Mobility  Bed Mobility               General bed mobility comments: refused    Transfers                 General transfer comment: refused  Ambulation/Gait             General Gait Details: refused  Stairs            Wheelchair Mobility    Modified Rankin (Stroke  Patients Only)       Balance                                             Pertinent Vitals/Pain Pain Assessment: Faces Faces Pain Scale: No hurt Pain Intervention(s): Limited activity within patient's tolerance;Monitored during session    Home Living Family/patient expects to be discharged to:: Skilled nursing facility Living Arrangements: Alone               Additional Comments: patient unable/unwilling to provide PLOF or home set up information, no family available to provide this information. Per chart, sounds like she lives alone with very limited social and family support and has fallen multiple times.    Prior Function           Comments: unable to clarify level of independence     Hand Dominance        Extremity/Trunk Assessment   Upper Extremity Assessment Upper Extremity Assessment: Generalized weakness    Lower Extremity Assessment Lower Extremity Assessment: Generalized weakness    Cervical / Trunk Assessment Cervical / Trunk Assessment: Kyphotic  Communication   Communication: No difficulties  Cognition Arousal/Alertness: Awake/alert Behavior During Therapy: Agitated;Flat affect Overall Cognitive Status: No family/caregiver present to determine baseline cognitive functioning  General Comments: A&O to self only, unable to tell me where she is or why she is here. Perseverates on telling me she is sleepy. Difficult to engage in therapy. Became more and more agitated with encouragement, ultimately stating "I want to go to a REAL rehab", when questioned what this means she stated "somewhere that you aren't that isn't here!"      General Comments General comments (skin integrity, edema, etc.): refused to get to EOB/OOB, unable to assess balance today    Exercises     Assessment/Plan    PT Assessment Patient needs continued PT services  PT Problem List Decreased strength;Decreased  cognition;Decreased knowledge of use of DME;Obesity;Decreased activity tolerance;Decreased safety awareness;Decreased balance;Decreased mobility       PT Treatment Interventions DME instruction;Balance training;Gait training;Stair training;Functional mobility training;Patient/family education;Cognitive remediation;Therapeutic activities;Therapeutic exercise;Wheelchair mobility training    PT Goals (Current goals can be found in the Care Plan section)  Acute Rehab PT Goals Patient Stated Goal: to be left alone and go home PT Goal Formulation: With patient Time For Goal Achievement: 04/02/20 Potential to Achieve Goals: Poor    Frequency Min 2X/week   Barriers to discharge        Co-evaluation               AM-PAC PT "6 Clicks" Mobility  Outcome Measure Help needed turning from your back to your side while in a flat bed without using bedrails?: A Little Help needed moving from lying on your back to sitting on the side of a flat bed without using bedrails?: A Lot Help needed moving to and from a bed to a chair (including a wheelchair)?: A Lot Help needed standing up from a chair using your arms (e.g., wheelchair or bedside chair)?: A Lot Help needed to walk in hospital room?: Total Help needed climbing 3-5 steps with a railing? : Total 6 Click Score: 11    End of Session   Activity Tolerance: Treatment limited secondary to agitation Patient left: in bed;with call bell/phone within reach (ED stretcher) Nurse Communication: Mobility status;Other (comment) (behaviors during therapy) PT Visit Diagnosis: Unsteadiness on feet (R26.81);Repeated falls (R29.6);Muscle weakness (generalized) (M62.81);Difficulty in walking, not elsewhere classified (R26.2)    Time: 6295-2841 PT Time Calculation (min) (ACUTE ONLY): 8 min   Charges:   PT Evaluation $PT Eval Moderate Complexity: 1 Mod         Windell Norfolk, DPT, PN1   Supplemental Physical Therapist [place]    Pager  [phone] Acute Rehab Office [phone]

## 2020-03-19 NOTE — TOC Initial Note (Signed)
Transition of Care [facility]) - Initial/Assessment Note    Patient Details  Name: Kristen Gray MRN: [id number] Date of Birth: 1945-07-21  [facility]) CM/SW Contact:    Raina Mina, [place] Phone Number: 03/19/2020, 11:22 AM  Clinical Narrative: Patient stated that she does not want to go to SNF placement and wants to go home. Patient states she is fine and feels the hospital kidnapped her and is holding her against her will. Patient at this time is not able to make her own decisions and was unsure what month and year it was. Patient was agitated and did not want to participate. APS worker, has been contacted to discuss next best options for patient.                   Expected Discharge Plan: Skilled Nursing Facility Barriers to Discharge: Continued Medical Work up (Patient refusal)   Patient Goals and CMS Choice        Expected Discharge Plan and Services Expected Discharge Plan: [place] arrangements for the past 2 months: Single Family Home                                      Prior Living Arrangements/Services Living arrangements for the past 2 months: Single Family Home Lives with:: Self Patient language and need for interpreter reviewed:: Yes Do you feel safe going back to the place where you live?: Yes      Need for Family Participation in Patient Care: Yes (Comment) Care giver support system in place?: Yes (comment)   Criminal Activity/Legal Involvement Pertinent to Current Situation/Hospitalization: No - Comment as needed  Activities of Daily Living      Permission Sought/Granted   Permission granted to share information with : Yes, Verbal Permission Granted  Share Information with NAME: [name]     Permission granted to share info w Relationship: Sister  Permission granted to share info w Contact Information: [phone]  Emotional Assessment Appearance:: Appears older than stated  age Attitude/Demeanor/Rapport: Avoidant,Hostile Affect (typically observed): Agitated Orientation: : Oriented to Self   Psych Involvement: No (comment)  Admission diagnosis:  weakness/dizziness Patient Active Problem List   Diagnosis Date Noted  . Atrial fibrillation with RVR ([place]) 03/05/2020  . Atrial fibrillation ([place]) 03/05/2020  . AKI (acute kidney injury) ([place])   . Moderate episode of recurrent major depressive disorder ([place]) 09/16/2019  . Crohn disease ([place]) 02/25/2019  . Chronic fatigue 02/25/2019  . Vitamin B 12 deficiency 02/25/2019  . Unilateral primary osteoarthritis, left knee 03/27/2018  . Hypokalemia 02/20/2018  . GERD (gastroesophageal reflux disease) 07/28/2017  . Unilateral primary osteoarthritis, right knee 01/31/2017  . Sebaceous cyst 10/21/2016  . Anemia 10/20/2016  . Primary osteoarthritis of right hip 09/12/2016  . Pulmonary hypertension ([place]) 07/26/2016  . OSA (obstructive sleep apnea) 07/26/2016  . Morbid obesity ([place]) 04/14/2016  . Hypertension, essential 04/13/2016  . Vitamin D deficiency 04/13/2016  . Insomnia 04/13/2016  . Generalized anxiety disorder 04/13/2016  . 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 Osteoarthritis, 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/13/2016  . Dyspnea 10/07/2015   PCP:  Martin[place], Betty G, MD Pharmacy:   [facility] [place], [place] - [address] [place] [address] Phone: [phone] Fax: [phone]     Social Determinants of Health (SDOH) Interventions    Readmission Risk Interventions No flowsheet  data found.

## 2020-03-20 LAB — POC SARS CORONAVIRUS 2 AG -  ED: SARS Coronavirus 2 Ag: NEGATIVE

## 2020-03-20 MED ORDER — LORAZEPAM 1 MG PO TABS
2.0000 mg | ORAL_TABLET | Freq: Once | ORAL | Status: AC
  Administered 2020-03-20: 2 mg via ORAL
  Filled 2020-03-20: qty 2

## 2020-03-20 NOTE — ED Notes (Signed)
Pt found to be soiled. Linen and pad changed. New diaper in place with purwick.

## 2020-03-20 NOTE — ED Provider Notes (Signed)
Emergency Medicine Observation Re-evaluation Note  Kristen Gray is a 75 y.o. female, seen on rounds today.  Pt initially presented to the ED for complaints of Weakness Patient here with frequent falls at home, worsening baseline confusion, family members (sister) felt she was unsafe and per family and EDP's evaluation, that the patient lacked the capacity to make her own decisions.  The patient has been angry and agitated with staff throughout her stay in the ED.  Unfortunately she is quite difficult to reason with.  Currently, the patient is sleeping.  Physical Exam  BP (!) 124/56   Pulse 61   Temp 97.7 F (36.5 C) (Oral)   Resp 16   Ht 4' 10"  (1.473 m)   Wt 68.8 kg   SpO2 98%   BMI 31.70 kg/m  Physical Exam General: NAD Cardiac: Regular rate Lungs: No respiratory distress Psych: Stable  ED Course / MDM  EKG:EKG Interpretation  Date/Time:  Wednesday March 18 2020 13:26:40 EST Ventricular Rate:  56 PR Interval:  218 QRS Duration: 114 QT Interval:  510 QTC Calculation: 493 R Axis:   76 Text Interpretation: Sinus rhythm Borderline prolonged PR interval Borderline intraventricular conduction delay Borderline T abnormalities, inferior leads Borderline prolonged QT interval Confirmed by Lacretia Leigh ([id number]) on 03/18/2020 1:39:05 PM    I have reviewed the labs performed to date as well as medications administered while in observation.  Recent changes in the last 24 hours include PT evaluation for SNF placement or 24 hour care.    Plan  Current plan is for Social Work placement in SNF or full time care. Patient is not under full IVC at this time.   Wyvonnia Dusky, MD 03/20/20 [phone]

## 2020-03-20 NOTE — Progress Notes (Signed)
CSW spoke to sister Stanton Kidney via phone who was seeking update.  CSW gave telephone # for [employer].

## 2020-03-20 NOTE — ED Notes (Signed)
Assumed care of this patient. Vitals taken. Pt alert to person and place. But needs verbal reorientation at times. NAD. Respirations regular/unlabored. Stretcher low, wheels locked, call bell within reach.

## 2020-03-20 NOTE — Progress Notes (Signed)
Authorization is pending for placement

## 2020-03-20 NOTE — ED Notes (Signed)
Attempted to call report to [place] [facility], receptionist answered transferred to nurse but no answer

## 2020-03-20 NOTE — ED Notes (Signed)
Pt given blanket per request.

## 2020-03-20 NOTE — ED Notes (Signed)
Pt is putting legs over side rails, yelling at staff and stating she wants to go home. Pt reoriented to surroundings

## 2020-03-20 NOTE — ED Notes (Signed)
Pt is yelling from room for someone to help. Upon entering room pt states she doesn't need anything.

## 2020-03-20 NOTE — ED Notes (Signed)
Attempted to call report again. Receptionist answered phone and stated all the nurses are out on the floor.

## 2020-03-20 NOTE — ED Notes (Signed)
Pt continues to yell, pt is agitated with staff, pt removing linens  and throwing it on floor

## 2020-03-20 NOTE — ED Notes (Signed)
Dinner Tray Ordered @ [phone].

## 2020-03-20 NOTE — Progress Notes (Signed)
Pt has PASRR # [id number]

## 2020-03-20 NOTE — ED Notes (Signed)
Pt given meal tray, went to reposition to eat and pt stated she was not hungry at this time

## 2020-03-20 NOTE — ED Notes (Signed)
Called for PTAR 21 in list

## 2020-03-21 DIAGNOSIS — G4700 Insomnia, unspecified: Secondary | ICD-10-CM | POA: Diagnosis not present

## 2020-03-21 DIAGNOSIS — F331 Major depressive disorder, recurrent, moderate: Secondary | ICD-10-CM | POA: Diagnosis not present

## 2020-03-21 DIAGNOSIS — M159 Polyosteoarthritis, unspecified: Secondary | ICD-10-CM | POA: Diagnosis not present

## 2020-03-21 DIAGNOSIS — K5090 Crohn's disease, unspecified, without complications: Secondary | ICD-10-CM | POA: Diagnosis not present

## 2020-03-21 DIAGNOSIS — E559 Vitamin D deficiency, unspecified: Secondary | ICD-10-CM | POA: Diagnosis not present

## 2020-03-21 DIAGNOSIS — R296 Repeated falls: Secondary | ICD-10-CM | POA: Diagnosis not present

## 2020-03-21 DIAGNOSIS — D519 Vitamin B12 deficiency anemia, unspecified: Secondary | ICD-10-CM | POA: Diagnosis not present

## 2020-03-21 DIAGNOSIS — M6281 Muscle weakness (generalized): Secondary | ICD-10-CM | POA: Diagnosis not present

## 2020-03-21 DIAGNOSIS — I10 Essential (primary) hypertension: Secondary | ICD-10-CM | POA: Diagnosis not present

## 2020-03-21 DIAGNOSIS — F419 Anxiety disorder, unspecified: Secondary | ICD-10-CM | POA: Diagnosis not present

## 2020-03-21 DIAGNOSIS — I480 Paroxysmal atrial fibrillation: Secondary | ICD-10-CM | POA: Diagnosis not present

## 2020-03-21 DIAGNOSIS — R5382 Chronic fatigue, unspecified: Secondary | ICD-10-CM | POA: Diagnosis not present

## 2020-03-21 DIAGNOSIS — K219 Gastro-esophageal reflux disease without esophagitis: Secondary | ICD-10-CM | POA: Diagnosis not present

## 2020-03-23 ENCOUNTER — Encounter (HOSPITAL_COMMUNITY): Payer: Self-pay | Admitting: Cardiology

## 2020-03-23 NOTE — Anesthesia Postprocedure Evaluation (Signed)
Anesthesia Post Note  Patient: Kristen Gray  Procedure(s) Performed: TRANSESOPHAGEAL ECHOCARDIOGRAM (TEE) (N/A ) CARDIOVERSION (N/A )     Patient location during evaluation: Endoscopy Anesthesia Type: MAC Level of consciousness: patient cooperative, oriented and sedated Pain management: pain level controlled Vital Signs Assessment: post-procedure vital signs reviewed and stable Respiratory status: spontaneous breathing, nonlabored ventilation, respiratory function stable and patient connected to nasal cannula oxygen Cardiovascular status: blood pressure returned to baseline and stable Postop Assessment: no apparent nausea or vomiting Anesthetic complications: no   No complications documented.                Midge Minium

## 2020-03-24 DIAGNOSIS — R410 Disorientation, unspecified: Secondary | ICD-10-CM | POA: Diagnosis not present

## 2020-03-24 DIAGNOSIS — Y9301 Activity, walking, marching and hiking: Secondary | ICD-10-CM | POA: Diagnosis not present

## 2020-03-24 DIAGNOSIS — F419 Anxiety disorder, unspecified: Secondary | ICD-10-CM | POA: Diagnosis not present

## 2020-03-24 DIAGNOSIS — R451 Restlessness and agitation: Secondary | ICD-10-CM | POA: Diagnosis not present

## 2020-03-24 DIAGNOSIS — Z7901 Long term (current) use of anticoagulants: Secondary | ICD-10-CM | POA: Diagnosis not present

## 2020-03-24 DIAGNOSIS — M542 Cervicalgia: Secondary | ICD-10-CM | POA: Diagnosis not present

## 2020-03-24 DIAGNOSIS — K219 Gastro-esophageal reflux disease without esophagitis: Secondary | ICD-10-CM | POA: Diagnosis not present

## 2020-03-24 DIAGNOSIS — M159 Polyosteoarthritis, unspecified: Secondary | ICD-10-CM | POA: Diagnosis not present

## 2020-03-24 DIAGNOSIS — R41841 Cognitive communication deficit: Secondary | ICD-10-CM | POA: Diagnosis not present

## 2020-03-24 DIAGNOSIS — B372 Candidiasis of skin and nail: Secondary | ICD-10-CM | POA: Diagnosis not present

## 2020-03-24 DIAGNOSIS — I4891 Unspecified atrial fibrillation: Secondary | ICD-10-CM | POA: Diagnosis not present

## 2020-03-24 DIAGNOSIS — Z043 Encounter for examination and observation following other accident: Secondary | ICD-10-CM | POA: Diagnosis not present

## 2020-03-24 DIAGNOSIS — M6281 Muscle weakness (generalized): Secondary | ICD-10-CM | POA: Diagnosis not present

## 2020-03-24 DIAGNOSIS — I10 Essential (primary) hypertension: Secondary | ICD-10-CM | POA: Diagnosis not present

## 2020-03-24 DIAGNOSIS — S0990XA Unspecified injury of head, initial encounter: Secondary | ICD-10-CM | POA: Diagnosis present

## 2020-03-24 DIAGNOSIS — R5382 Chronic fatigue, unspecified: Secondary | ICD-10-CM | POA: Diagnosis not present

## 2020-03-24 DIAGNOSIS — I480 Paroxysmal atrial fibrillation: Secondary | ICD-10-CM | POA: Diagnosis not present

## 2020-03-24 DIAGNOSIS — F29 Unspecified psychosis not due to a substance or known physiological condition: Secondary | ICD-10-CM | POA: Diagnosis not present

## 2020-03-24 DIAGNOSIS — R627 Adult failure to thrive: Secondary | ICD-10-CM | POA: Diagnosis not present

## 2020-03-24 DIAGNOSIS — S0003XA Contusion of scalp, initial encounter: Secondary | ICD-10-CM | POA: Diagnosis not present

## 2020-03-24 DIAGNOSIS — F331 Major depressive disorder, recurrent, moderate: Secondary | ICD-10-CM | POA: Diagnosis not present

## 2020-03-24 DIAGNOSIS — K5090 Crohn's disease, unspecified, without complications: Secondary | ICD-10-CM | POA: Diagnosis not present

## 2020-03-24 DIAGNOSIS — S0083XA Contusion of other part of head, initial encounter: Secondary | ICD-10-CM | POA: Diagnosis not present

## 2020-03-24 DIAGNOSIS — W19XXXA Unspecified fall, initial encounter: Secondary | ICD-10-CM | POA: Diagnosis not present

## 2020-03-24 DIAGNOSIS — E559 Vitamin D deficiency, unspecified: Secondary | ICD-10-CM | POA: Diagnosis not present

## 2020-03-24 DIAGNOSIS — D519 Vitamin B12 deficiency anemia, unspecified: Secondary | ICD-10-CM | POA: Diagnosis not present

## 2020-03-24 DIAGNOSIS — G4700 Insomnia, unspecified: Secondary | ICD-10-CM | POA: Diagnosis not present

## 2020-03-24 DIAGNOSIS — R296 Repeated falls: Secondary | ICD-10-CM | POA: Diagnosis not present

## 2020-03-24 DIAGNOSIS — R531 Weakness: Secondary | ICD-10-CM | POA: Diagnosis not present

## 2020-04-01 DIAGNOSIS — R451 Restlessness and agitation: Secondary | ICD-10-CM | POA: Diagnosis not present

## 2020-04-01 DIAGNOSIS — M6281 Muscle weakness (generalized): Secondary | ICD-10-CM | POA: Diagnosis not present

## 2020-04-01 DIAGNOSIS — R410 Disorientation, unspecified: Secondary | ICD-10-CM | POA: Diagnosis not present

## 2020-04-01 DIAGNOSIS — R627 Adult failure to thrive: Secondary | ICD-10-CM | POA: Diagnosis not present

## 2020-04-09 DIAGNOSIS — R627 Adult failure to thrive: Secondary | ICD-10-CM | POA: Diagnosis not present

## 2020-04-09 DIAGNOSIS — R410 Disorientation, unspecified: Secondary | ICD-10-CM | POA: Diagnosis not present

## 2020-04-09 DIAGNOSIS — F419 Anxiety disorder, unspecified: Secondary | ICD-10-CM | POA: Diagnosis not present

## 2020-04-09 DIAGNOSIS — M6281 Muscle weakness (generalized): Secondary | ICD-10-CM | POA: Diagnosis not present

## 2020-04-20 DIAGNOSIS — B372 Candidiasis of skin and nail: Secondary | ICD-10-CM | POA: Diagnosis not present

## 2020-04-21 ENCOUNTER — Emergency Department (HOSPITAL_COMMUNITY)
Admission: EM | Admit: 2020-04-21 | Discharge: 2020-04-22 | Disposition: A | Discharge status: Home / Self Care | Payer: PPO | Attending: Emergency Medicine | Admitting: Emergency Medicine

## 2020-04-21 ENCOUNTER — Emergency Department (HOSPITAL_COMMUNITY): Payer: PPO

## 2020-04-21 ENCOUNTER — Other Ambulatory Visit: Payer: Self-pay

## 2020-04-21 DIAGNOSIS — W19XXXA Unspecified fall, initial encounter: Secondary | ICD-10-CM | POA: Insufficient documentation

## 2020-04-21 DIAGNOSIS — I4891 Unspecified atrial fibrillation: Secondary | ICD-10-CM | POA: Insufficient documentation

## 2020-04-21 DIAGNOSIS — Y9301 Activity, walking, marching and hiking: Secondary | ICD-10-CM | POA: Insufficient documentation

## 2020-04-21 DIAGNOSIS — I10 Essential (primary) hypertension: Secondary | ICD-10-CM | POA: Insufficient documentation

## 2020-04-21 DIAGNOSIS — S0003XA Contusion of scalp, initial encounter: Secondary | ICD-10-CM | POA: Insufficient documentation

## 2020-04-21 DIAGNOSIS — Z7901 Long term (current) use of anticoagulants: Secondary | ICD-10-CM | POA: Diagnosis not present

## 2020-04-21 DIAGNOSIS — S0083XA Contusion of other part of head, initial encounter: Secondary | ICD-10-CM | POA: Diagnosis not present

## 2020-04-21 DIAGNOSIS — S0990XA Unspecified injury of head, initial encounter: Secondary | ICD-10-CM | POA: Diagnosis present

## 2020-04-21 DIAGNOSIS — Z043 Encounter for examination and observation following other accident: Secondary | ICD-10-CM | POA: Diagnosis not present

## 2020-04-21 HISTORY — DX: Essential (primary) hypertension: I10

## 2020-04-21 HISTORY — DX: Anxiety disorder, unspecified: F41.9

## 2020-04-21 HISTORY — DX: Polyosteoarthritis, unspecified: M15.9

## 2020-04-21 HISTORY — DX: Cognitive communication deficit: R41.841

## 2020-04-21 HISTORY — DX: Major depressive disorder, single episode, unspecified: F32.9

## 2020-04-21 HISTORY — DX: Chronic fatigue, unspecified: R53.82

## 2020-04-21 HISTORY — DX: Unspecified atrial fibrillation: I48.91

## 2020-04-21 HISTORY — DX: Other vitamin B12 deficiency anemias: D51.8

## 2020-04-21 HISTORY — DX: Repeated falls: R29.6

## 2020-04-21 HISTORY — DX: Insomnia, unspecified: G47.00

## 2020-04-21 HISTORY — DX: Vitamin D deficiency, unspecified: E55.9

## 2020-04-21 IMAGING — CT CT HEAD W/O CM
4 series · 16 of 47 positions shown, 18 images · non-contrast
Comparison: Head CT [DATE]

CLINICAL DATA: Fall on blood thinners.

EXAM:
CT HEAD WITHOUT CONTRAST
TECHNIQUE: Contiguous axial images were obtained from the base of the skull
through the vertex without intravenous contrast.

[Series 3: head bone · axial · 0.42mm/px · z∈[+1262,+1294]mm · 3 of 81 slices shown]
[im 9/81  bone]
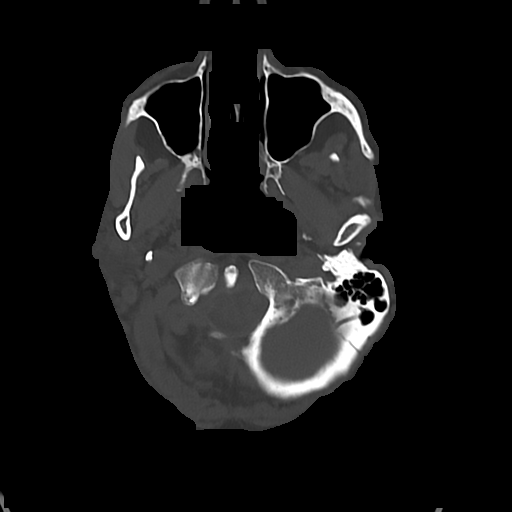
[im 17/81  bone]
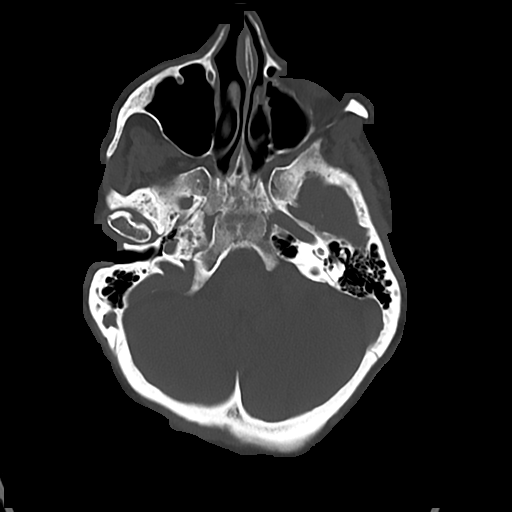
[im 25/81  bone]
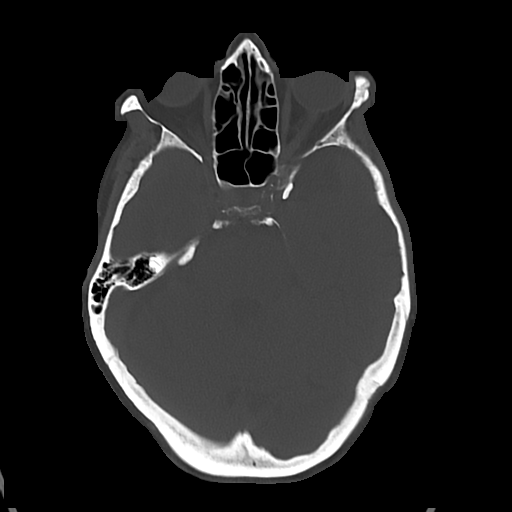

[Series 4: head wo · axial · 0.42mm/px · z∈[+1266,+1386]mm · 7 of 33 slices shown, 9 images]
[im 5/33  brain]
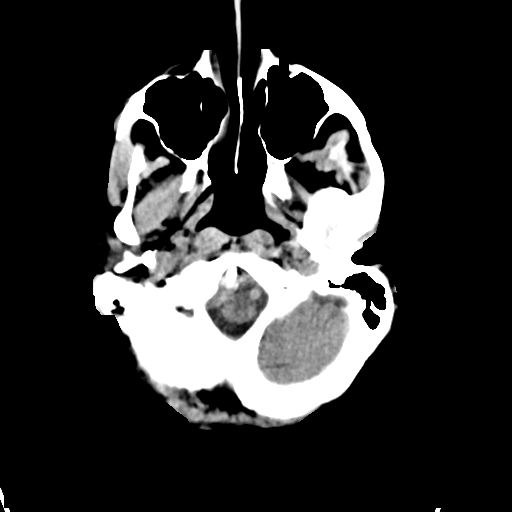
[im 5/33  bone]
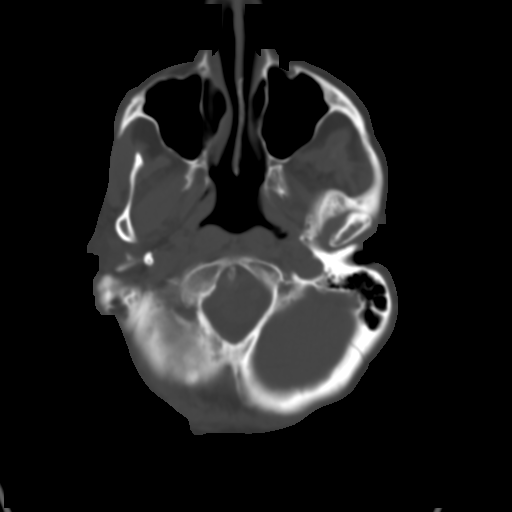
[im 9/33  brain]
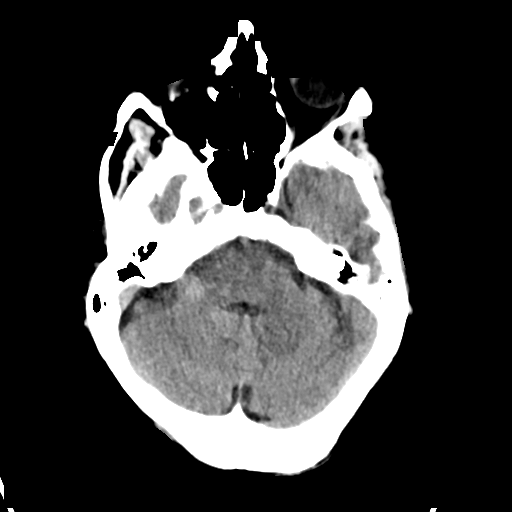
[im 13/33  brain]
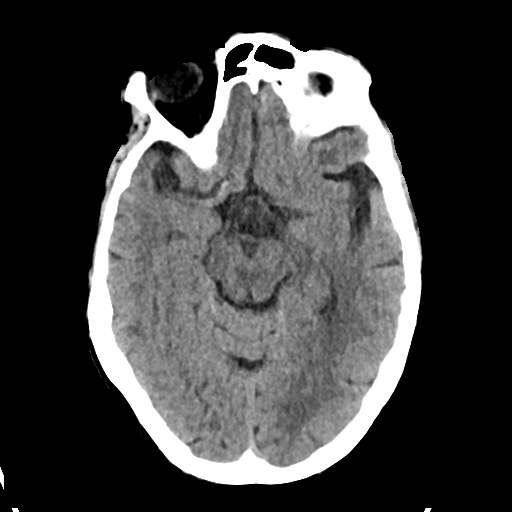
[im 17/33  brain]
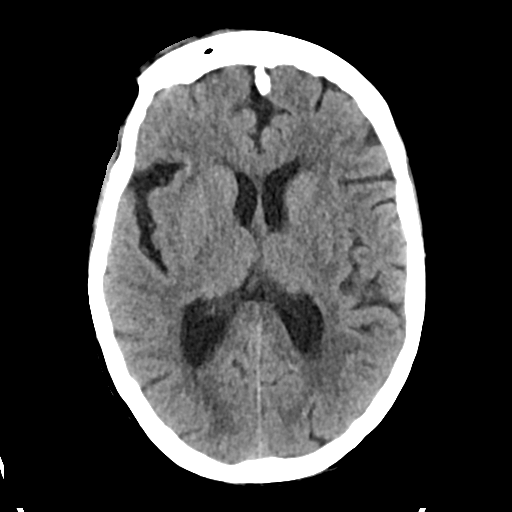
[im 21/33  brain]
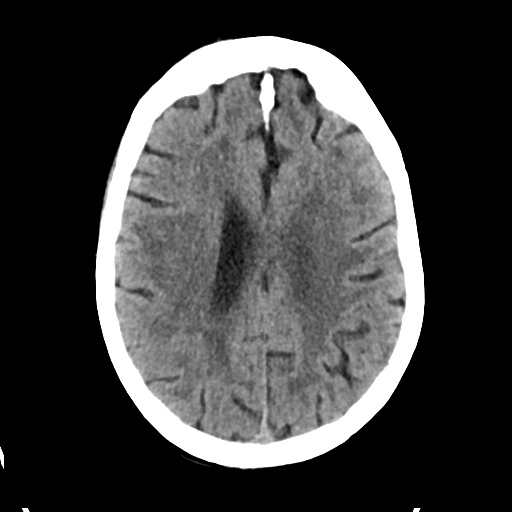
[im 21/33  bone]
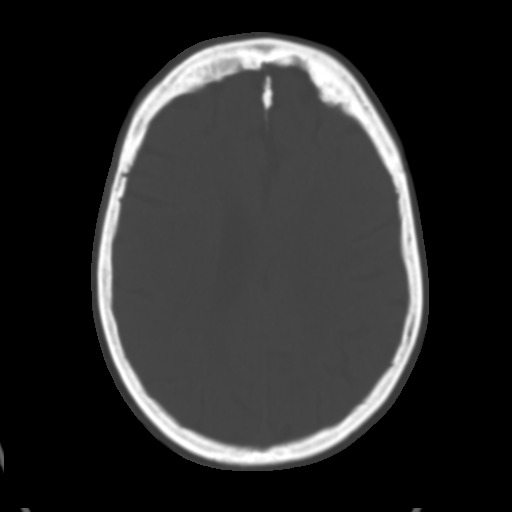
[im 25/33  brain]
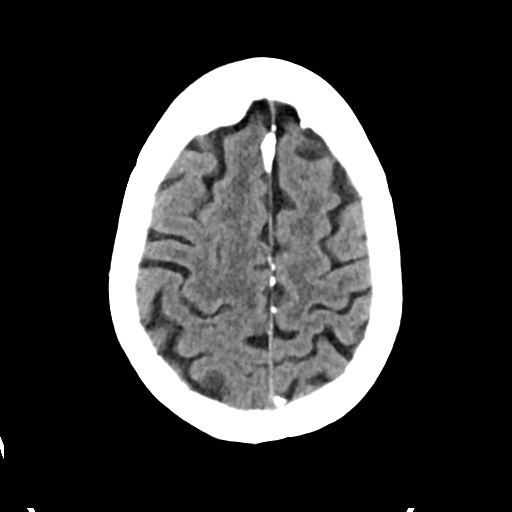
[im 29/33  brain]
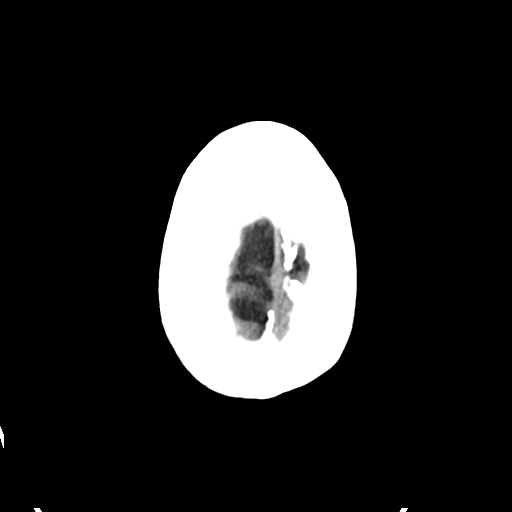

[Series 5: cor soft · coronal · 0.31mm/px · 3 of 68 slices shown]
[im 23/68  brain]
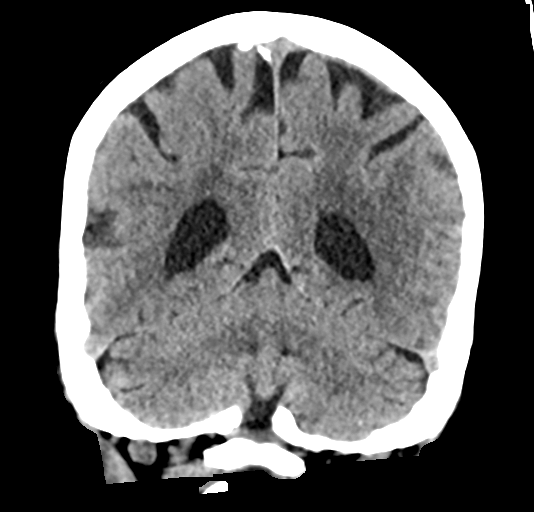
[im 30/68  brain]
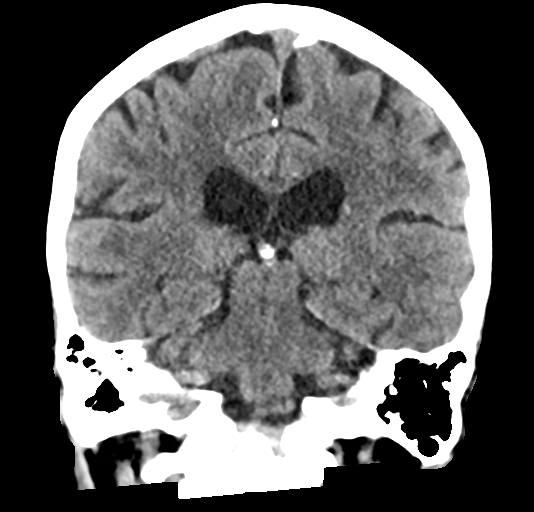
[im 38/68  brain]
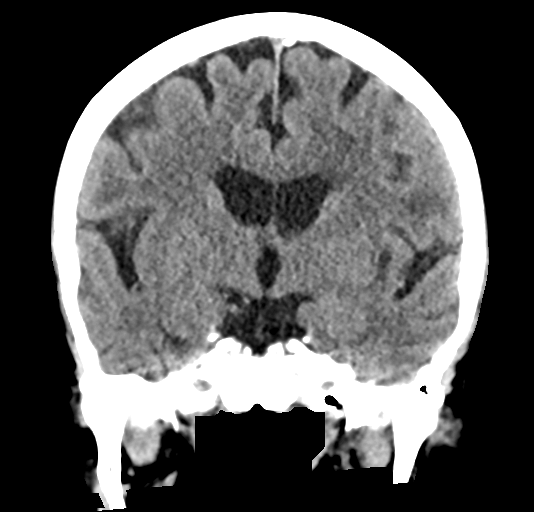

[Series 6: sag soft · sagittal · 0.31mm/px · 3 of 52 slices shown]
[im 18/52  brain]
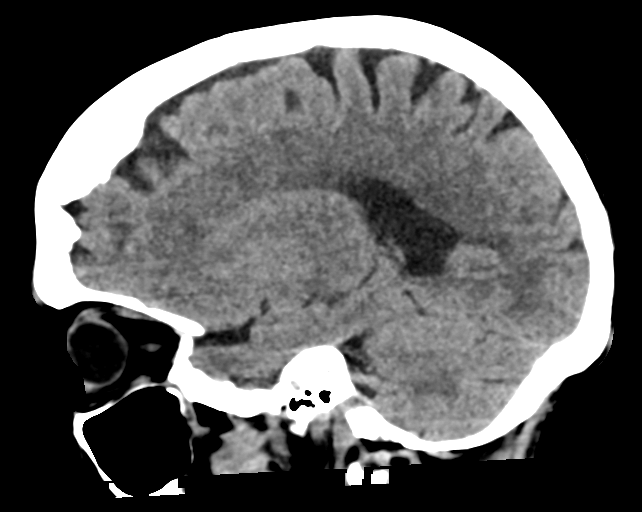
[im 26/52  brain]
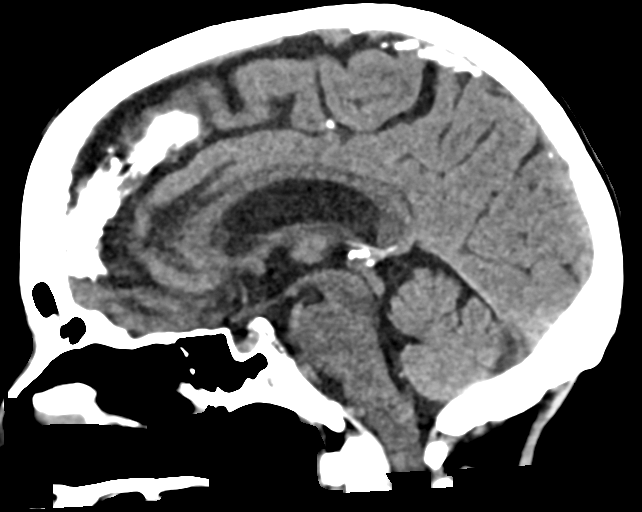
[im 35/52  brain]
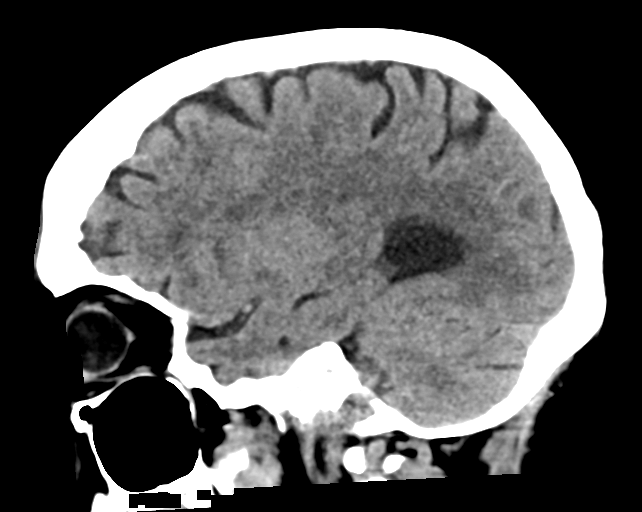

[16 of 47 positions shown; findings below may reference images not displayed]

FINDINGS: Brain: Stable degree of atrophy and chronic small vessel ischemia.
No intracranial hemorrhage, mass effect, or midline shift. No
hydrocephalus. The basilar cisterns are patent. No evidence of
territorial infarct or acute ischemia. No extra-axial or
intracranial fluid collection.

Vascular: Atherosclerosis of skullbase vasculature without
hyperdense vessel or abnormal calcification.

Skull: No fracture or focal lesion.

Sinuses/Orbits: Chronic opacification of lateral right mastoid air
cells. No evidence of acute fracture. Bilateral cataract resection.

Other: Right frontal scalp hematoma.
IMPRESSION: 1. Right frontal scalp hematoma. No acute intracranial abnormality.
No skull fracture.
2. Stable atrophy and chronic small vessel ischemia.

## 2020-04-21 IMAGING — CT CT CERVICAL SPINE W/O CM
4 series · 16 of 35 positions shown, 19 images · non-contrast
Comparison: None.

CLINICAL DATA: Fall on blood thinners

EXAM:
CT CERVICAL SPINE WITHOUT CONTRAST
TECHNIQUE: Multidetector CT imaging of the cervical spine was performed without
intravenous contrast. Multiplanar CT image reconstructions were also
generated.

[Series 5: c spine soft · axial · 0.33mm/px · z∈[+1148,+1200]mm · 3 of 80 slices shown]
[im 14/80  soft-tissue]
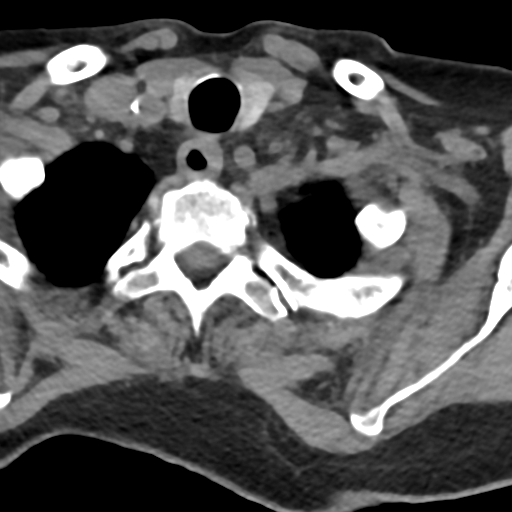
[im 27/80  soft-tissue]
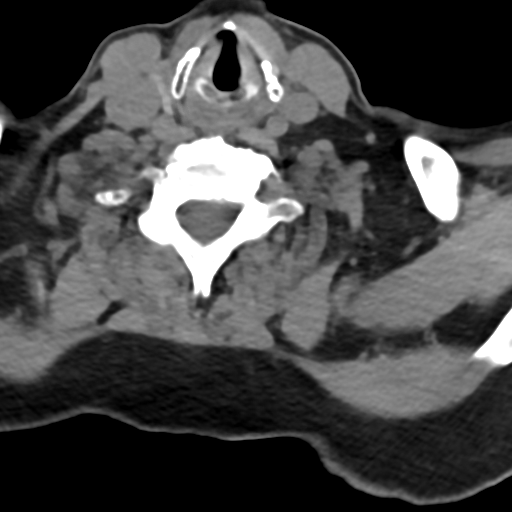
[im 40/80  soft-tissue]
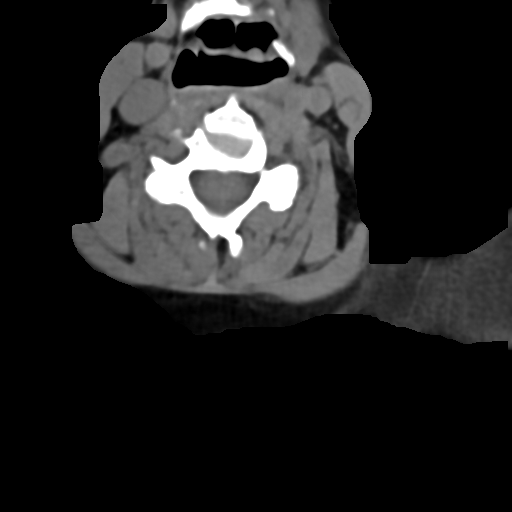

[Series 8: sag bone · sagittal · 0.34mm/px · 5 of 80 slices shown, 6 images]
[im 27/80  bone]
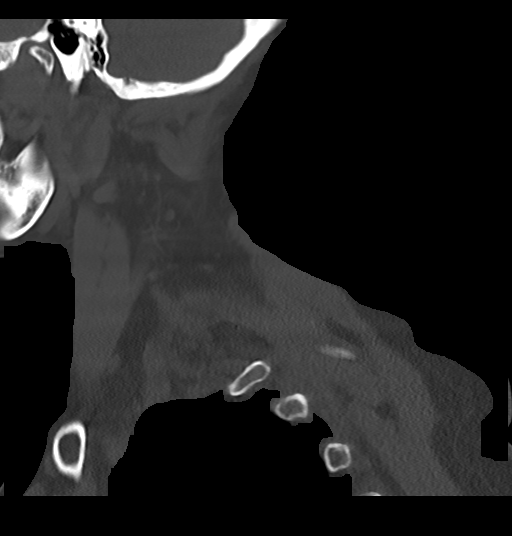
[im 33/80  bone]
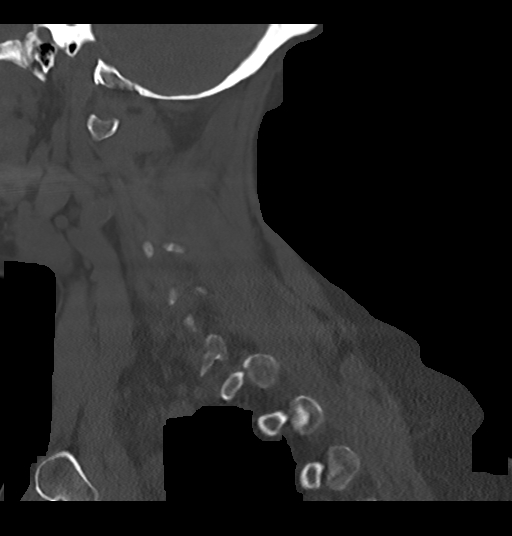
[im 40/80  soft-tissue]
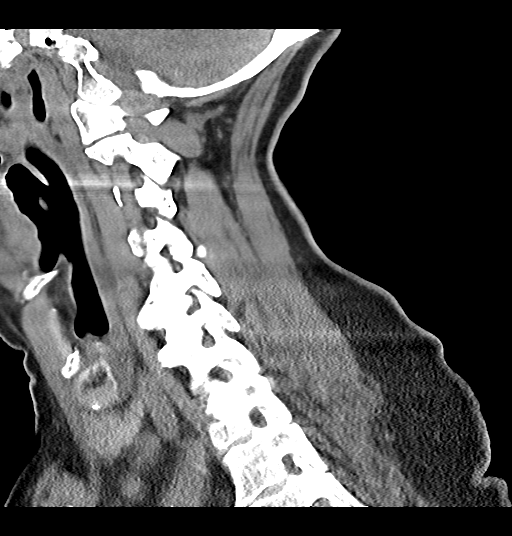
[im 40/80  bone]
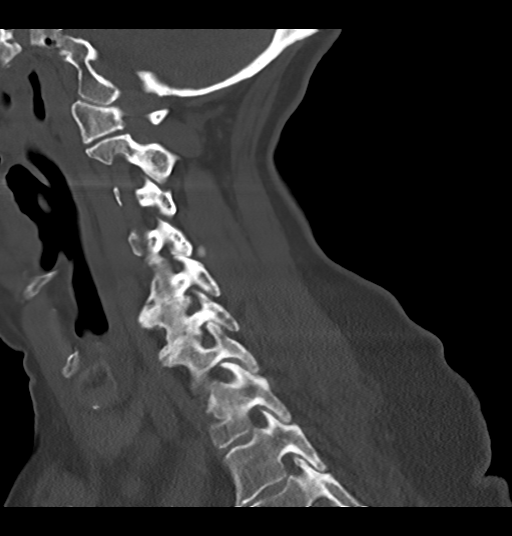
[im 47/80  bone]
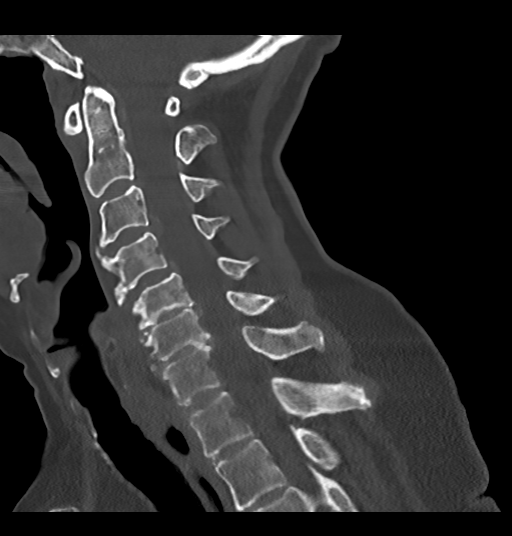
[im 53/80  bone]
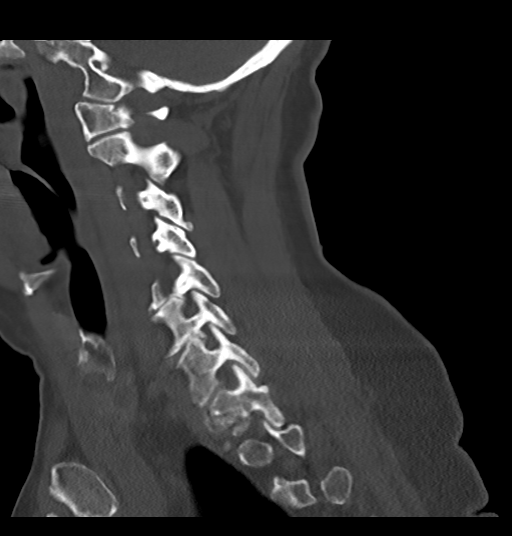

[Series 9: cor bone · coronal · 0.31mm/px · 3 of 77 slices shown]
[im 16/77  bone]
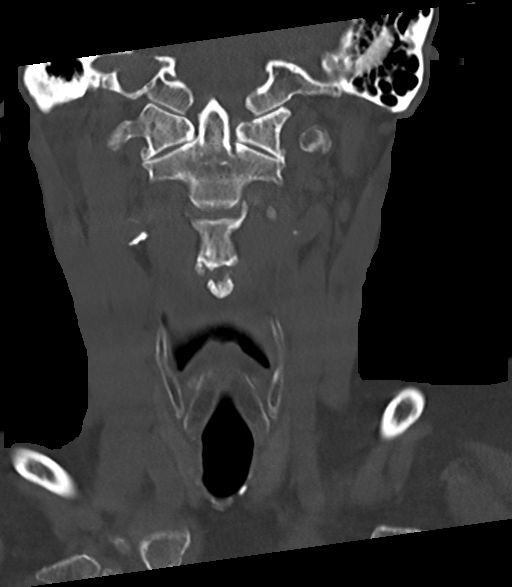
[im 31/77  bone]
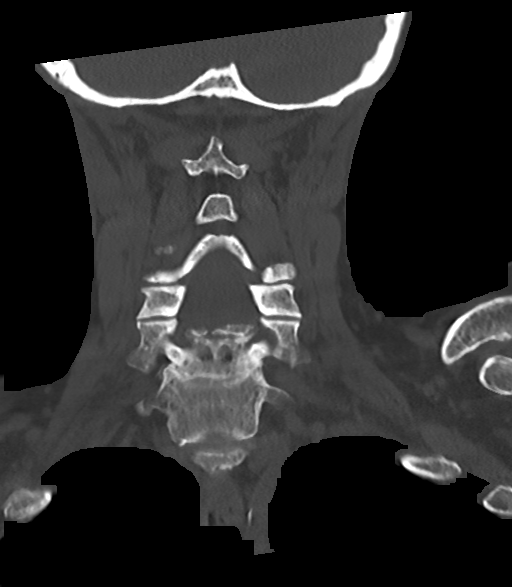
[im 46/77  bone]
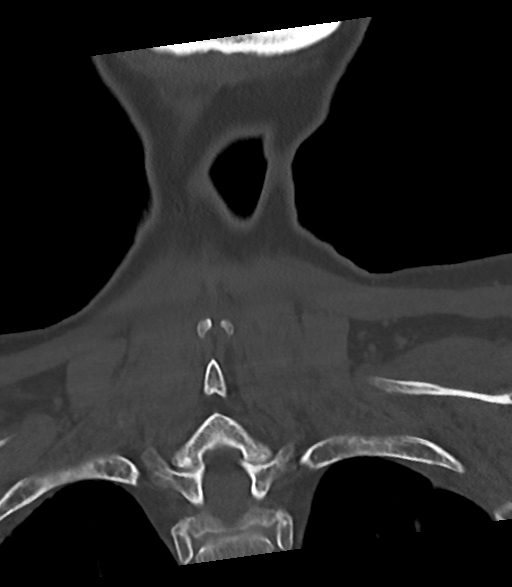

[Series 10: orthogonal axials · axial · 0.21mm/px · z∈[+1137,+1251]mm · 5 of 88 slices shown, 7 images]
[im 15/88  soft-tissue]
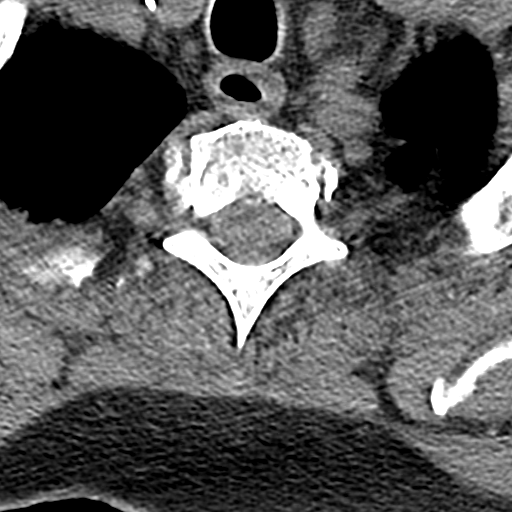
[im 15/88  bone]
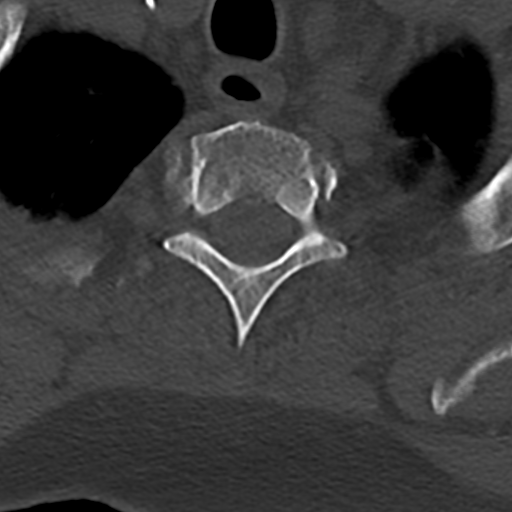
[im 30/88  bone]
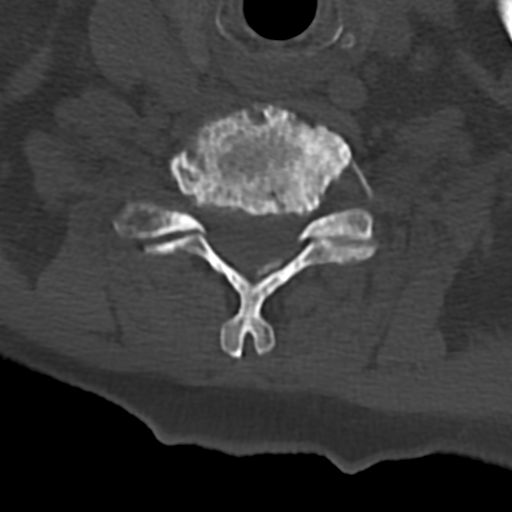
[im 44/88  bone]
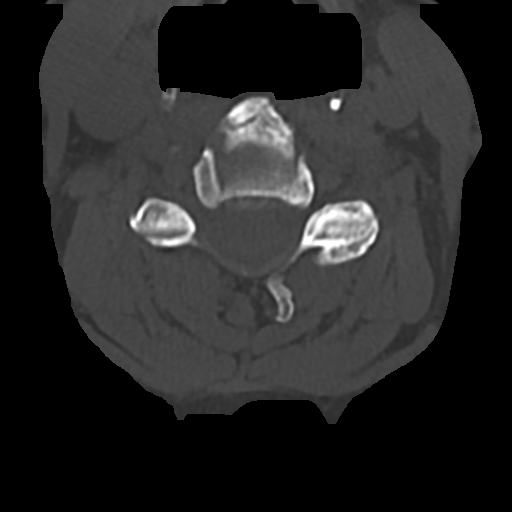
[im 59/88  bone]
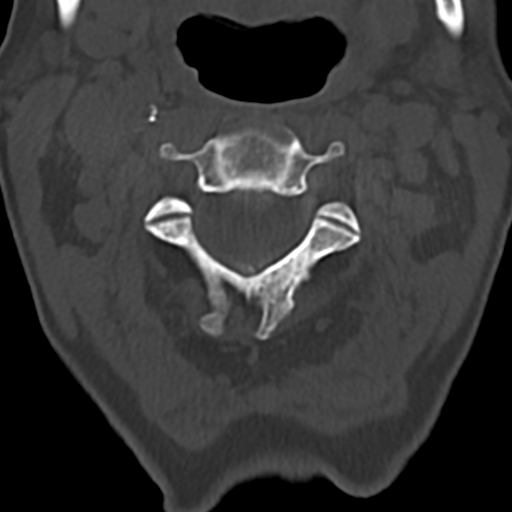
[im 73/88  soft-tissue]
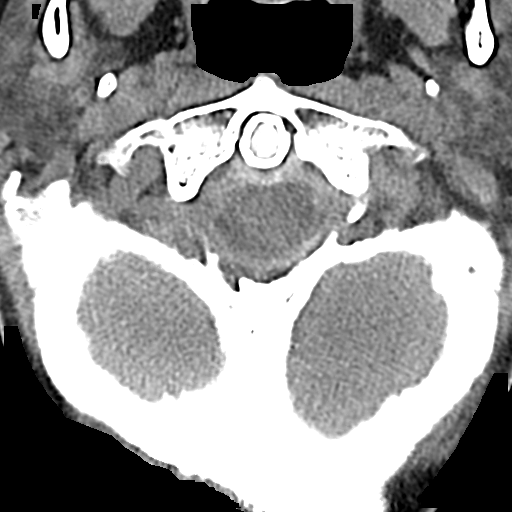
[im 73/88  bone]
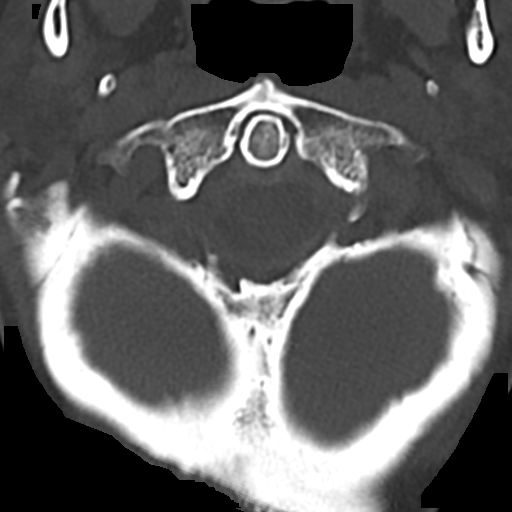

[16 of 35 positions shown; findings below may reference images not displayed]

FINDINGS: Alignment: Grade 1 anterolisthesis of C4 on C5, likely degenerative.
No traumatic subluxation.

Skull base and vertebrae: No acute fracture. Vertebral body heights
are maintained. The dens and skull base are intact.

Soft tissues and spinal canal: No prevertebral fluid or swelling.
Curvilinear hypodensity at C3-C4 is felt to represent partially
ossified disc hilar than canal hematoma.

Disc levels: Diffuse degenerative disc disease from C3-C4 through
C6-C7. Scattered facet hypertrophy.

Upper chest: No acute findings.

Other: None.
IMPRESSION: 1. No acute fracture or subluxation of the cervical spine.
2. Multilevel degenerative disc disease.  Mild facet hypertrophy.

## 2020-04-21 MED ORDER — METRONIDAZOLE 500 MG PO TABS
500.0000 mg | ORAL_TABLET | Freq: Once | ORAL | Status: AC
  Administered 2020-04-22: 500 mg via ORAL
  Filled 2020-04-21: qty 1

## 2020-04-21 MED ORDER — IBUPROFEN 800 MG PO TABS
800.0000 mg | ORAL_TABLET | Freq: Once | ORAL | Status: AC
  Administered 2020-04-22: 800 mg via ORAL
  Filled 2020-04-21: qty 1

## 2020-04-21 MED ORDER — DOXYCYCLINE HYCLATE 100 MG PO TABS
100.0000 mg | ORAL_TABLET | Freq: Once | ORAL | Status: AC
  Administered 2020-04-22: 100 mg via ORAL
  Filled 2020-04-21: qty 1

## 2020-04-21 MED ORDER — ACETAMINOPHEN 500 MG PO TABS
1000.0000 mg | ORAL_TABLET | Freq: Once | ORAL | Status: AC
  Administered 2020-04-22: 1000 mg via ORAL
  Filled 2020-04-21: qty 2

## 2020-04-21 NOTE — ED Triage Notes (Signed)
PTAR arrival from [place] [facility] post mechanical fall on thinners. Per EMS pt walking to bed and fell face first. Visible bruising and hematoma to R anterior forehead above eye. A&Ox2 self and situation. Last Eliquis administered at 1700 per [facility]

## 2020-04-22 ENCOUNTER — Encounter (HOSPITAL_COMMUNITY): Payer: Self-pay | Admitting: Cardiology

## 2020-04-22 ENCOUNTER — Encounter (HOSPITAL_COMMUNITY): Payer: Self-pay

## 2020-04-22 DIAGNOSIS — R5383 Other fatigue: Secondary | ICD-10-CM | POA: Diagnosis not present

## 2020-04-22 DIAGNOSIS — R457 State of emotional shock and stress, unspecified: Secondary | ICD-10-CM | POA: Diagnosis not present

## 2020-04-22 DIAGNOSIS — B372 Candidiasis of skin and nail: Secondary | ICD-10-CM | POA: Diagnosis not present

## 2020-04-22 DIAGNOSIS — F0391 Unspecified dementia with behavioral disturbance: Secondary | ICD-10-CM | POA: Diagnosis not present

## 2020-04-22 DIAGNOSIS — Z743 Need for continuous supervision: Secondary | ICD-10-CM | POA: Diagnosis not present

## 2020-04-22 DIAGNOSIS — R279 Unspecified lack of coordination: Secondary | ICD-10-CM | POA: Diagnosis not present

## 2020-04-22 DIAGNOSIS — I10 Essential (primary) hypertension: Secondary | ICD-10-CM | POA: Diagnosis not present

## 2020-04-22 DIAGNOSIS — M6281 Muscle weakness (generalized): Secondary | ICD-10-CM | POA: Diagnosis not present

## 2020-04-22 DIAGNOSIS — W19XXXA Unspecified fall, initial encounter: Secondary | ICD-10-CM | POA: Diagnosis not present

## 2020-04-22 NOTE — ED Notes (Signed)
ptar called

## 2020-04-22 NOTE — ED Provider Notes (Signed)
[facility] EMERGENCY DEPARTMENT Provider Note   CSN: [id number] Arrival date & time: 04/21/20  2327     History Chief Complaint  Patient presents with  . Fall    Level II on thinners    Kristen Gray is a 75 y.o. female.  The history is provided by the EMS personnel. The history is limited by the condition of the patient.  Fall This is a new problem. The current episode started less than 1 hour ago. The problem occurs constantly. The problem has not changed since onset.Pertinent negatives include no chest pain, no abdominal pain, no headaches and no shortness of breath. Nothing aggravates the symptoms. Nothing relieves the symptoms. She has tried nothing for the symptoms. The treatment provided no relief.  Witnessed fall while walking.  Hematoma to the forehead.       Past Medical History:  Diagnosis Date  . Anxiety disorder   . Atrial fibrillation ([place])   . Chronic fatigue   . Cognitive communication deficit   . Crohn's disease ([place])   . Essential hypertension   . GERD (gastroesophageal reflux disease)   . Insomnia   . Major depression   . Polyosteoarthritis   . Repeated falls   . Vitamin B12 deficiency (dietary) anemia   . Vitamin D deficiency     There are no problems to display for this patient.    OB History   No obstetric history on file.     No family history on file.  Social History   Tobacco Use  . Smoking status: Unknown If Ever Smoked  Substance Use Topics  . Alcohol use: Not Currently  . Drug use: Never    Home Medications Prior to Admission medications   Not on File    Allergies    Penicillins  Review of Systems   Review of Systems  Unable to perform ROS: Dementia  Constitutional: Negative for fever.  Respiratory: Negative for shortness of breath.   Cardiovascular: Negative for chest pain.  Gastrointestinal: Negative for abdominal pain.  Neurological: Negative for headaches.  Psychiatric/Behavioral: Negative  for agitation.  All other systems reviewed and are negative.   Physical Exam Updated Vital Signs BP (!) 157/70   Pulse 63   Temp 98.5 F (36.9 C) (Oral)   Resp 19   SpO2 100%   Physical Exam Vitals and nursing note reviewed.  Constitutional:      General: She is not in acute distress.    Appearance: Normal appearance.  HENT:     Head: Normocephalic.      Nose: Nose normal.     Mouth/Throat:     Mouth: Mucous membranes are moist.  Eyes:     Extraocular Movements: Extraocular movements intact.     Pupils: Pupils are equal, round, and reactive to light.  Cardiovascular:     Rate and Rhythm: Normal rate and regular rhythm.     Pulses: Normal pulses.     Heart sounds: Normal heart sounds.  Pulmonary:     Effort: Pulmonary effort is normal.     Breath sounds: Normal breath sounds.  Abdominal:     General: Abdomen is flat. Bowel sounds are normal.     Palpations: Abdomen is soft.     Tenderness: There is no abdominal tenderness. There is no guarding.  Musculoskeletal:     Cervical back: Normal range of motion and neck supple.  Neurological:     Mental Status: She is alert.     ED  Results / Procedures / Treatments   Labs (all labs ordered are listed, but only abnormal results are displayed) Labs Reviewed - No data to display  EKG None  Radiology CT Head Wo Contrast  Result Date: 04/21/2020 CLINICAL DATA:  Fall on blood thinners. EXAM: CT HEAD WITHOUT CONTRAST TECHNIQUE: Contiguous axial images were obtained from the base of the skull through the vertex without intravenous contrast. COMPARISON:  Head CT 03/18/2020 FINDINGS: Brain: Stable degree of atrophy and chronic small vessel ischemia. No intracranial hemorrhage, mass effect, or midline shift. No hydrocephalus. The basilar cisterns are patent. No evidence of territorial infarct or acute ischemia. No extra-axial or intracranial fluid collection. Vascular: Atherosclerosis of skullbase vasculature without hyperdense  vessel or abnormal calcification. Skull: No fracture or focal lesion. Sinuses/Orbits: Chronic opacification of lateral right mastoid air cells. No evidence of acute fracture. Bilateral cataract resection. Other: Right frontal scalp hematoma. IMPRESSION: 1. Right frontal scalp hematoma. No acute intracranial abnormality. No skull fracture. 2. Stable atrophy and chronic small vessel ischemia. Electronically Signed   By: Keith Rake M.D.   On: 04/21/2020 23:52   CT Cervical Spine Wo Contrast  Result Date: 04/21/2020 CLINICAL DATA:  Fall on blood thinners EXAM: CT CERVICAL SPINE WITHOUT CONTRAST TECHNIQUE: Multidetector CT imaging of the cervical spine was performed without intravenous contrast. Multiplanar CT image reconstructions were also generated. COMPARISON:  None. FINDINGS: Alignment: Grade 1 anterolisthesis of C4 on C5, likely degenerative. No traumatic subluxation. Skull base and vertebrae: No acute fracture. Vertebral body heights are maintained. The dens and skull base are intact. Soft tissues and spinal canal: No prevertebral fluid or swelling. Curvilinear hypodensity at C3-C4 is felt to represent partially ossified disc hilar than canal hematoma. Disc levels: Diffuse degenerative disc disease from C3-C4 through C6-C7. Scattered facet hypertrophy. Upper chest: No acute findings. Other: None. IMPRESSION: 1. No acute fracture or subluxation of the cervical spine. 2. Multilevel degenerative disc disease.  Mild facet hypertrophy. Electronically Signed   By: Keith Rake M.D.   On: 04/21/2020 23:57    Procedures Procedures   Medications Ordered in ED Medications  doxycycline (VIBRA-TABS) tablet 100 mg (has no administration in time range)  metroNIDAZOLE (FLAGYL) tablet 500 mg (has no administration in time range)  ibuprofen (ADVIL) tablet 800 mg (has no administration in time range)  acetaminophen (TYLENOL) tablet 1,000 mg (has no administration in time range)    ED Course  I have  reviewed the triage vital signs and the nursing notes.  Pertinent labs & imaging results that were available during my care of the patient were reviewed by me and considered in my medical decision making (see chart for details).    Mechanical fall resulting in Facial hematoma.  No intracranial abnormality.  No C spine abnormality.  Stable for discharge with close follow up.    Shamel Galyean Wagman was evaluated in Emergency Department on 04/22/2020 for the symptoms described in the history of present illness. She was evaluated in the context of the global COVID-19 pandemic, which necessitated consideration that the patient might be at risk for infection with the SARS-CoV-2 virus that causes COVID-19. Institutional protocols and algorithms that pertain to the evaluation of patients at risk for COVID-19 are in a state of rapid change based on information released by regulatory bodies including the CDC and federal and state organizations. These policies and algorithms were followed during the patient's care in the ED.   Final Clinical Impression(s) / ED Diagnoses Return for intractable cough, coughing up blood,  fevers >100.4 unrelieved by medication, shortness of breath, intractable vomiting, chest pain, shortness of breath, weakness, numbness, changes in speech, facial asymmetry, abdominal pain, passing out, Inability to tolerate liquids or food, cough, altered mental status or any concerns. No signs of systemic illness or infection. The patient is nontoxic-appearing on exam and vital signs are within normal limits.  I have reviewed the triage vital signs and the nursing notes. Pertinent labs & imaging results that were available during my care of the patient were reviewed by me and considered in my medical decision making (see chart for details). After history, exam, and medical workup I feel the patient has been appropriately medically screened and is safe for discharge home. Pertinent diagnoses were discussed  with the patient. Patient was given return precautions.     Xavyer Steenson, MD 04/22/20 1314

## 2020-04-23 DIAGNOSIS — K5090 Crohn's disease, unspecified, without complications: Secondary | ICD-10-CM | POA: Diagnosis not present

## 2020-04-23 DIAGNOSIS — I10 Essential (primary) hypertension: Secondary | ICD-10-CM | POA: Diagnosis not present

## 2020-04-23 DIAGNOSIS — Z9181 History of falling: Secondary | ICD-10-CM | POA: Diagnosis not present

## 2020-04-23 DIAGNOSIS — I480 Paroxysmal atrial fibrillation: Secondary | ICD-10-CM | POA: Diagnosis not present

## 2020-04-24 DIAGNOSIS — G3184 Mild cognitive impairment, so stated: Secondary | ICD-10-CM | POA: Diagnosis not present

## 2020-04-24 DIAGNOSIS — F331 Major depressive disorder, recurrent, moderate: Secondary | ICD-10-CM | POA: Diagnosis not present

## 2020-04-24 DIAGNOSIS — G4701 Insomnia due to medical condition: Secondary | ICD-10-CM | POA: Diagnosis not present

## 2020-04-24 DIAGNOSIS — F064 Anxiety disorder due to known physiological condition: Secondary | ICD-10-CM | POA: Diagnosis not present

## 2020-04-27 DIAGNOSIS — F064 Anxiety disorder due to known physiological condition: Secondary | ICD-10-CM | POA: Diagnosis not present

## 2020-04-27 DIAGNOSIS — G3184 Mild cognitive impairment, so stated: Secondary | ICD-10-CM | POA: Diagnosis not present

## 2020-04-28 ENCOUNTER — Emergency Department (HOSPITAL_COMMUNITY)
Admission: EM | Admit: 2020-04-28 | Discharge: 2020-04-28 | Disposition: A | Discharge status: Home / Self Care | Payer: PPO | Attending: Emergency Medicine | Admitting: Emergency Medicine

## 2020-04-28 ENCOUNTER — Emergency Department (HOSPITAL_COMMUNITY): Payer: PPO

## 2020-04-28 ENCOUNTER — Encounter (HOSPITAL_COMMUNITY): Payer: Self-pay | Admitting: Emergency Medicine

## 2020-04-28 ENCOUNTER — Other Ambulatory Visit: Payer: Self-pay

## 2020-04-28 DIAGNOSIS — S0083XA Contusion of other part of head, initial encounter: Secondary | ICD-10-CM | POA: Insufficient documentation

## 2020-04-28 DIAGNOSIS — R609 Edema, unspecified: Secondary | ICD-10-CM | POA: Diagnosis not present

## 2020-04-28 DIAGNOSIS — S022XXA Fracture of nasal bones, initial encounter for closed fracture: Secondary | ICD-10-CM | POA: Insufficient documentation

## 2020-04-28 DIAGNOSIS — W19XXXA Unspecified fall, initial encounter: Secondary | ICD-10-CM | POA: Insufficient documentation

## 2020-04-28 DIAGNOSIS — F0390 Unspecified dementia without behavioral disturbance: Secondary | ICD-10-CM | POA: Diagnosis not present

## 2020-04-28 DIAGNOSIS — Y92039 Unspecified place in apartment as the place of occurrence of the external cause: Secondary | ICD-10-CM | POA: Insufficient documentation

## 2020-04-28 DIAGNOSIS — S0011XA Contusion of right eyelid and periocular area, initial encounter: Secondary | ICD-10-CM | POA: Insufficient documentation

## 2020-04-28 DIAGNOSIS — R9431 Abnormal electrocardiogram [ECG] [EKG]: Secondary | ICD-10-CM | POA: Diagnosis not present

## 2020-04-28 DIAGNOSIS — Z043 Encounter for examination and observation following other accident: Secondary | ICD-10-CM | POA: Diagnosis not present

## 2020-04-28 DIAGNOSIS — I10 Essential (primary) hypertension: Secondary | ICD-10-CM | POA: Diagnosis not present

## 2020-04-28 DIAGNOSIS — S0992XA Unspecified injury of nose, initial encounter: Secondary | ICD-10-CM | POA: Diagnosis present

## 2020-04-28 LAB — CBC WITH DIFFERENTIAL/PLATELET
Abs Immature Granulocytes: 0.07 10*3/uL (ref 0.00–0.07)
Basophils Absolute: 0.1 10*3/uL (ref 0.0–0.1)
Basophils Relative: 1 %
Eosinophils Absolute: 0.2 10*3/uL (ref 0.0–0.5)
Eosinophils Relative: 2 %
HCT: 36.3 % (ref 36.0–46.0)
Hemoglobin: 11.5 g/dL — ABNORMAL LOW (ref 12.0–15.0)
Immature Granulocytes: 1 %
Lymphocytes Relative: 9 %
Lymphs Abs: 0.9 10*3/uL (ref 0.7–4.0)
MCH: 30.7 pg (ref 26.0–34.0)
MCHC: 31.7 g/dL (ref 30.0–36.0)
MCV: 97.1 fL (ref 80.0–100.0)
Monocytes Absolute: 0.7 10*3/uL (ref 0.1–1.0)
Monocytes Relative: 7 %
Neutro Abs: 8.6 10*3/uL — ABNORMAL HIGH (ref 1.7–7.7)
Neutrophils Relative %: 80 %
Platelets: 153 10*3/uL (ref 150–400)
RBC: 3.74 MIL/uL — ABNORMAL LOW (ref 3.87–5.11)
RDW: 16 % — ABNORMAL HIGH (ref 11.5–15.5)
WBC: 10.6 10*3/uL — ABNORMAL HIGH (ref 4.0–10.5)
nRBC: 0 % (ref 0.0–0.2)

## 2020-04-28 LAB — URINALYSIS, ROUTINE W REFLEX MICROSCOPIC
Bilirubin Urine: NEGATIVE
Glucose, UA: NEGATIVE mg/dL
Hgb urine dipstick: NEGATIVE
Ketones, ur: NEGATIVE mg/dL
Leukocytes,Ua: NEGATIVE
Nitrite: NEGATIVE
Protein, ur: NEGATIVE mg/dL
Specific Gravity, Urine: 1.006 (ref 1.005–1.030)
pH: 8 (ref 5.0–8.0)

## 2020-04-28 LAB — BASIC METABOLIC PANEL
Anion gap: 6 (ref 5–15)
BUN: 11 mg/dL (ref 8–23)
CO2: 30 mmol/L (ref 22–32)
Calcium: 8.8 mg/dL — ABNORMAL LOW (ref 8.9–10.3)
Chloride: 101 mmol/L (ref 98–111)
Creatinine, Ser: 0.93 mg/dL (ref 0.44–1.00)
GFR, Estimated: >60 mL/min (ref >60)
Glucose, Bld: 158 mg/dL — ABNORMAL HIGH (ref 70–99)
Potassium: 3.2 mmol/L — ABNORMAL LOW (ref 3.5–5.1)
Sodium: 137 mmol/L (ref 135–145)

## 2020-04-28 LAB — PROTIME-INR
INR: 1.4 — ABNORMAL HIGH (ref 0.8–1.2)
Prothrombin Time: 16.7 seconds — ABNORMAL HIGH (ref 11.4–15.2)

## 2020-04-28 NOTE — ED Notes (Signed)
Patient transported to CT 

## 2020-04-28 NOTE — ED Notes (Signed)
Help get patient undressed into a gown on the monitor did ekg shown to Dr Melina Copa patient is resting with nurse at bedside

## 2020-04-28 NOTE — ED Notes (Signed)
Patient ambulated to restroom with staff assistance, gait steady. Patient back in recliner chair resting comfortably at this time.

## 2020-04-28 NOTE — ED Notes (Signed)
PTAR at facility for transport

## 2020-04-28 NOTE — ED Notes (Signed)
Attempted again to call report to [facility], Nurse I got on phone reports she is on the 100 hall and there is only 2 nurses trying to pass 100 patient meds.

## 2020-04-28 NOTE — ED Notes (Signed)
Attempted to call report to facility. Unable to reach staff. Was transferred by main line. No answer.

## 2020-04-28 NOTE — Discharge Instructions (Addendum)
You were seen in the emergency department for evaluations of injuries from a fall.  You had a CAT scan of your head neck and face.  The only significant acute finding was a nasal fracture.  You should follow up with an ENT doctor.  Ice to the affected area.  Tylenol as needed for pain.  Return to the emergency department for any worsening or concerning symptoms

## 2020-04-28 NOTE — ED Notes (Signed)
Pt ambulated to restroom with 2 person hands on assist for safety. Pt had steady gait and is back in bed with drink and snack.

## 2020-04-28 NOTE — Progress Notes (Signed)
Orthopedic Tech Progress Note Patient Details:  Kristen Gray [birth year] [id number] Level 2 trauma Patient ID: Kristen Gray, female   DOB: 09-30-1945, 75 y.o.   MRN: [id number]   Kristen Gray 04/28/2020, 2:20 PM

## 2020-04-28 NOTE — ED Notes (Signed)
Pt arrives via EMS from [facility] with complaints of a fall on thinners, Eliquis. Pt states she is not sure what happened but fell face first. Nose bleeding upon EMS arrival. Bleeding controlled during triage. No LOC reported. Pt A/O X4. Pt denies pain at this time

## 2020-04-28 NOTE — ED Provider Notes (Signed)
[facility] EMERGENCY DEPARTMENT Provider Note   CSN: [id number] Arrival date & time: 04/28/20  1315     History No chief complaint on file.   Kristen Gray is a 75 y.o. female.  Level 5 caveat secondary to dementia.  She is brought in by EMS from her facility after an unwitnessed fall in her apartment. Level 2 trauma activation for falll on thinners.  Initially she had a nosebleed but that is since stopped.  Patient herself denies any complaints.  She cannot tell me why she fell.  On review of chart she fell a week ago and had a hematoma to her right forehead.  Now she shows extensive bruising dependently from there around her eyes and cheek.  The history is provided by the patient and the EMS personnel.  Fall This is a recurrent problem. The current episode started 1 to 2 hours ago. The problem has not changed since onset.Pertinent negatives include no chest pain, no abdominal pain, no headaches and no shortness of breath. Nothing aggravates the symptoms. Nothing relieves the symptoms. She has tried nothing for the symptoms. The treatment provided no relief.       History reviewed. No pertinent past medical history.  There are no problems to display for this patient.   History reviewed. No pertinent surgical history.   OB History   No obstetric history on file.     History reviewed. No pertinent family history.     Home Medications Prior to Admission medications   Not on File    Allergies    Patient has no allergy information on record.  Review of Systems   Review of Systems  Unable to perform ROS: Dementia  Respiratory: Negative for shortness of breath.   Cardiovascular: Negative for chest pain.  Gastrointestinal: Negative for abdominal pain.  Neurological: Negative for headaches.    Physical Exam Updated Vital Signs BP (!) 193/80   Pulse 83   Temp 98.8 F (37.1 C) (Oral)   Resp 15   Ht 4' 10"  (1.473 m)   Wt 63.5 kg   SpO2 100%    BMI 29.26 kg/m   Physical Exam Vitals and nursing note reviewed.  Constitutional:      General: She is not in acute distress.    Appearance: Normal appearance. She is well-developed.  HENT:     Head: Normocephalic.     Comments: Bruising over the right forehead and ecchymosis around the right eye and right cheek.  Her nose is swollen and reddened with some dried blood.  No septal hematoma. Eyes:     Conjunctiva/sclera: Conjunctivae normal.  Neck:     Comments: Patient in cervical collar, trach midline. Cardiovascular:     Rate and Rhythm: Normal rate and regular rhythm.     Heart sounds: No murmur heard.   Pulmonary:     Effort: Pulmonary effort is normal. No respiratory distress.     Breath sounds: Normal breath sounds.  Abdominal:     Palpations: Abdomen is soft.     Tenderness: There is no abdominal tenderness. There is no guarding or rebound.  Musculoskeletal:        General: No deformity or signs of injury. Normal range of motion.     Right lower leg: No edema.     Left lower leg: No edema.  Skin:    General: Skin is warm and dry.  Neurological:     General: No focal deficit present.     Mental  Status: She is alert. Mental status is at baseline.       ED Results / Procedures / Treatments   Labs (all labs ordered are listed, but only abnormal results are displayed) Labs Reviewed  BASIC METABOLIC PANEL - Abnormal; Notable for the following components:      Result Value   Potassium 3.2 (*)    Glucose, Bld 158 (*)    Calcium 8.8 (*)    All other components within normal limits  CBC WITH DIFFERENTIAL/PLATELET - Abnormal; Notable for the following components:   WBC 10.6 (*)    RBC 3.74 (*)    Hemoglobin 11.5 (*)    RDW 16.0 (*)    Neutro Abs 8.6 (*)    All other components within normal limits  PROTIME-INR - Abnormal; Notable for the following components:   Prothrombin Time 16.7 (*)    INR 1.4 (*)    All other components within normal limits  URINALYSIS,  ROUTINE W REFLEX MICROSCOPIC - Abnormal; Notable for the following components:   Color, Urine STRAW (*)    All other components within normal limits    EKG EKG Interpretation  Date/Time:  Tuesday April 28 2020 13:19:30 EDT Ventricular Rate:  86 PR Interval:  202 QRS Duration: 96 QT Interval:  415 QTC Calculation: 497 R Axis:   61 Text Interpretation: Sinus rhythm Atrial premature complex Consider left atrial enlargement Probable LVH with secondary repol abnrm Borderline prolonged QT interval Confirmed by Aletta Edouard [phone]) on 04/28/2020 1:28:17 PM   Radiology CT Head Wo Contrast  Result Date: 04/28/2020 CLINICAL DATA:  Fall, blood thinners, facial injury EXAM: CT HEAD WITHOUT CONTRAST CT MAXILLOFACIAL WITHOUT CONTRAST CT CERVICAL SPINE WITHOUT CONTRAST TECHNIQUE: Multidetector CT imaging of the head, cervical spine, and maxillofacial structures were performed using the standard protocol without intravenous contrast. Multiplanar CT image reconstructions of the cervical spine and maxillofacial structures were also generated. COMPARISON:  None. FINDINGS: CT HEAD FINDINGS Brain: No evidence of acute infarction, hemorrhage, hydrocephalus, extra-axial collection or mass lesion/mass effect. Periventricular and deep white matter hypodensity. Vascular: No hyperdense vessel or unexpected calcification. CT FACIAL BONES FINDINGS Skull: Normal. Negative for fracture or focal lesion. Facial bones: Minimally displaced fractures of the right nasal bones (series 6, image 53). No other displaced fractures or dislocations. Sinuses/Orbits: No acute finding. Other: Soft tissue hematoma right forehead (series 1, image 18). Soft tissue laceration of the nose. CT CERVICAL SPINE FINDINGS Alignment: Normal. Skull base and vertebrae: No acute fracture. No primary bone lesion or focal pathologic process. Soft tissues and spinal canal: No prevertebral fluid or swelling. No visible canal hematoma. Disc levels: Moderate  multilevel disc space height loss and osteophytosis of the lower cervical spine. Upper chest: Negative. Other: None. IMPRESSION: 1. No acute intracranial pathology. Small-vessel white matter disease. 2. Minimally displaced fractures of the right nasal bones with overlying laceration. No other displaced fractures or dislocations of the facial bones. 3. Soft tissue hematoma of the right forehead. 4. No fracture or static subluxation of the cervical spine. 5. Moderate multilevel disc space height loss and osteophytosis of the lower cervical spine. Electronically Signed   By: Eddie Candle M.D.   On: 04/28/2020 14:40   CT Cervical Spine Wo Contrast  Result Date: 04/28/2020 CLINICAL DATA:  Fall, blood thinners, facial injury EXAM: CT HEAD WITHOUT CONTRAST CT MAXILLOFACIAL WITHOUT CONTRAST CT CERVICAL SPINE WITHOUT CONTRAST TECHNIQUE: Multidetector CT imaging of the head, cervical spine, and maxillofacial structures were performed using the standard protocol without intravenous  contrast. Multiplanar CT image reconstructions of the cervical spine and maxillofacial structures were also generated. COMPARISON:  None. FINDINGS: CT HEAD FINDINGS Brain: No evidence of acute infarction, hemorrhage, hydrocephalus, extra-axial collection or mass lesion/mass effect. Periventricular and deep white matter hypodensity. Vascular: No hyperdense vessel or unexpected calcification. CT FACIAL BONES FINDINGS Skull: Normal. Negative for fracture or focal lesion. Facial bones: Minimally displaced fractures of the right nasal bones (series 6, image 53). No other displaced fractures or dislocations. Sinuses/Orbits: No acute finding. Other: Soft tissue hematoma right forehead (series 1, image 18). Soft tissue laceration of the nose. CT CERVICAL SPINE FINDINGS Alignment: Normal. Skull base and vertebrae: No acute fracture. No primary bone lesion or focal pathologic process. Soft tissues and spinal canal: No prevertebral fluid or swelling. No  visible canal hematoma. Disc levels: Moderate multilevel disc space height loss and osteophytosis of the lower cervical spine. Upper chest: Negative. Other: None. IMPRESSION: 1. No acute intracranial pathology. Small-vessel white matter disease. 2. Minimally displaced fractures of the right nasal bones with overlying laceration. No other displaced fractures or dislocations of the facial bones. 3. Soft tissue hematoma of the right forehead. 4. No fracture or static subluxation of the cervical spine. 5. Moderate multilevel disc space height loss and osteophytosis of the lower cervical spine. Electronically Signed   By: Eddie Candle M.D.   On: 04/28/2020 14:40   CT Maxillofacial WO CM  Result Date: 04/28/2020 CLINICAL DATA:  Fall, blood thinners, facial injury EXAM: CT HEAD WITHOUT CONTRAST CT MAXILLOFACIAL WITHOUT CONTRAST CT CERVICAL SPINE WITHOUT CONTRAST TECHNIQUE: Multidetector CT imaging of the head, cervical spine, and maxillofacial structures were performed using the standard protocol without intravenous contrast. Multiplanar CT image reconstructions of the cervical spine and maxillofacial structures were also generated. COMPARISON:  None. FINDINGS: CT HEAD FINDINGS Brain: No evidence of acute infarction, hemorrhage, hydrocephalus, extra-axial collection or mass lesion/mass effect. Periventricular and deep white matter hypodensity. Vascular: No hyperdense vessel or unexpected calcification. CT FACIAL BONES FINDINGS Skull: Normal. Negative for fracture or focal lesion. Facial bones: Minimally displaced fractures of the right nasal bones (series 6, image 53). No other displaced fractures or dislocations. Sinuses/Orbits: No acute finding. Other: Soft tissue hematoma right forehead (series 1, image 18). Soft tissue laceration of the nose. CT CERVICAL SPINE FINDINGS Alignment: Normal. Skull base and vertebrae: No acute fracture. No primary bone lesion or focal pathologic process. Soft tissues and spinal canal: No  prevertebral fluid or swelling. No visible canal hematoma. Disc levels: Moderate multilevel disc space height loss and osteophytosis of the lower cervical spine. Upper chest: Negative. Other: None. IMPRESSION: 1. No acute intracranial pathology. Small-vessel white matter disease. 2. Minimally displaced fractures of the right nasal bones with overlying laceration. No other displaced fractures or dislocations of the facial bones. 3. Soft tissue hematoma of the right forehead. 4. No fracture or static subluxation of the cervical spine. 5. Moderate multilevel disc space height loss and osteophytosis of the lower cervical spine. Electronically Signed   By: Eddie Candle M.D.   On: 04/28/2020 14:40    Procedures Procedures   Medications Ordered in ED Medications - No data to display  ED Course  I have reviewed the triage vital signs and the nursing notes.  Pertinent labs & imaging results that were available during my care of the patient were reviewed by me and considered in my medical decision making (see chart for details).  Clinical Course as of 04/28/20 1506  Tue Apr 28, 2020  1445 Remove cervical  collar, patient has no neck pain. [MB]    Clinical Course User Index [MB] Hayden Rasmussen, MD   MDM Rules/Calculators/A&P                         This patient complains of fall and nosebleed; this involves an extensive number of treatment Options and is a complaint that carries with it a high risk of complications and Morbidity. The differential includes skull fracture, intracranial hemorrhage, nasal fracture, facial fractures, septal hematoma, cervical fracture  I ordered, reviewed and interpreted labs, which included CBC with mildly elevated white count, stable hemoglobin, chemistries fairly normal mildly low potassium and elevated glucose INR elevated due to her anticoagulation I ordered imaging studies which included CT head CT max face and CT cervical spine and I independently    visualized  and interpreted imaging which showed acute nasal fracture and chronic findings as Additional history obtained from EMS Previous records obtained and reviewed prior ED visit 1 week ago for fall and facial contusion  After the interventions stated above, I reevaluated the patient and found patient is fairly asymptomatic.  She will be returned to her facility by [facility].  Awaiting ambulance.   Final Clinical Impression(s) / ED Diagnoses Final diagnoses:  Fall, initial encounter  Closed fracture of nasal bone, initial encounter    Rx / DC Orders ED Discharge Orders    None       Hayden Rasmussen, MD 04/28/20 1925

## 2020-04-29 ENCOUNTER — Encounter (HOSPITAL_COMMUNITY): Payer: Self-pay | Admitting: Cardiology

## 2020-04-29 DIAGNOSIS — M6281 Muscle weakness (generalized): Secondary | ICD-10-CM | POA: Diagnosis not present

## 2020-04-29 DIAGNOSIS — F29 Unspecified psychosis not due to a substance or known physiological condition: Secondary | ICD-10-CM | POA: Diagnosis not present

## 2020-04-29 DIAGNOSIS — F0391 Unspecified dementia with behavioral disturbance: Secondary | ICD-10-CM | POA: Diagnosis not present

## 2020-04-29 DIAGNOSIS — R296 Repeated falls: Secondary | ICD-10-CM | POA: Diagnosis not present

## 2020-05-04 DIAGNOSIS — F0391 Unspecified dementia with behavioral disturbance: Secondary | ICD-10-CM | POA: Diagnosis not present

## 2020-05-04 DIAGNOSIS — M6281 Muscle weakness (generalized): Secondary | ICD-10-CM | POA: Diagnosis not present

## 2020-05-04 DIAGNOSIS — R600 Localized edema: Secondary | ICD-10-CM | POA: Diagnosis not present

## 2020-05-04 DIAGNOSIS — R296 Repeated falls: Secondary | ICD-10-CM | POA: Diagnosis not present

## 2020-05-05 DIAGNOSIS — F0391 Unspecified dementia with behavioral disturbance: Secondary | ICD-10-CM | POA: Diagnosis not present

## 2020-05-05 DIAGNOSIS — R2231 Localized swelling, mass and lump, right upper limb: Secondary | ICD-10-CM | POA: Diagnosis not present

## 2020-05-05 DIAGNOSIS — R600 Localized edema: Secondary | ICD-10-CM | POA: Diagnosis not present

## 2020-05-05 DIAGNOSIS — W19XXXA Unspecified fall, initial encounter: Secondary | ICD-10-CM | POA: Diagnosis not present

## 2020-05-05 DIAGNOSIS — M79644 Pain in right finger(s): Secondary | ICD-10-CM | POA: Diagnosis not present

## 2020-05-05 DIAGNOSIS — S62624A Displaced fracture of medial phalanx of right ring finger, initial encounter for closed fracture: Secondary | ICD-10-CM | POA: Diagnosis not present

## 2020-05-05 DIAGNOSIS — R296 Repeated falls: Secondary | ICD-10-CM | POA: Diagnosis not present

## 2020-05-07 ENCOUNTER — Ambulatory Visit (INDEPENDENT_AMBULATORY_CARE_PROVIDER_SITE_OTHER): Payer: PPO | Admitting: Otolaryngology

## 2020-05-07 ENCOUNTER — Other Ambulatory Visit: Payer: Self-pay

## 2020-05-07 ENCOUNTER — Encounter (INDEPENDENT_AMBULATORY_CARE_PROVIDER_SITE_OTHER): Payer: Self-pay

## 2020-05-07 VITALS — Temp 97.5°F

## 2020-05-07 DIAGNOSIS — S022XXA Fracture of nasal bones, initial encounter for closed fracture: Secondary | ICD-10-CM | POA: Diagnosis not present

## 2020-05-07 DIAGNOSIS — F411 Generalized anxiety disorder: Secondary | ICD-10-CM | POA: Diagnosis not present

## 2020-05-07 DIAGNOSIS — R451 Restlessness and agitation: Secondary | ICD-10-CM | POA: Diagnosis not present

## 2020-05-07 DIAGNOSIS — F0391 Unspecified dementia with behavioral disturbance: Secondary | ICD-10-CM | POA: Diagnosis not present

## 2020-05-07 NOTE — Progress Notes (Addendum)
HPI: Kristen Gray is a 75 y.o. female who presents is referred by ED locum tenum for evaluation of nasal fracture.  Patient apparently fell on 04/28/2020 and was seen in the ED where she had a CT scan of her face that demonstrated a minimally displaced nasal bone fracture.  She presents to the office today in follow-up.  She does not complain of any trouble breathing through her nose.  She is not concerned about the way the nose appears.  She states that she has always had a large nose.  She is having no further bleeding from her nose.Marland Kitchen  Past Medical History:  Diagnosis Date  . Anemia   . Anxiety   . Anxiety disorder   . Arthritis   . Atrial fibrillation ([place])   . Chronic fatigue   . Cognitive communication deficit   . Crohn's disease ([place])   . Depression   . Essential hypertension   . GERD (gastroesophageal reflux disease)   . Heart murmur   . Hypertension   . Insomnia   . Major depression   . Polyosteoarthritis   . Reflux   . Repeated falls   . Vitamin B12 deficiency (dietary) anemia   . Vitamin D deficiency    Past Surgical History:  Procedure Laterality Date  . ANKLE SURGERY    . CARDIOVERSION N/A 03/09/2020   Procedure: CARDIOVERSION;  Surgeon: Larey Dresser, MD;  Location: [facility] ENDOSCOPY;  Service: Cardiovascular;  Laterality: N/A;  . CARDIOVERSION N/A 03/05/2020   Procedure: CARDIOVERSION;  Surgeon: Larey Dresser, MD;  Location: [facility] ENDOSCOPY;  Service: Cardiovascular;  Laterality: N/A;  . CARPAL TUNNEL RELEASE    . CHOLECYSTECTOMY    . COLON SURGERY    . RIGHT/LEFT HEART CATH AND CORONARY ANGIOGRAPHY N/A 09/15/2016   Procedure: RIGHT/LEFT HEART CATH AND CORONARY ANGIOGRAPHY;  Surgeon: Larey Dresser, MD;  Location: [facility];  Service: Cardiovascular;  Laterality: N/A;  . TEE WITHOUT CARDIOVERSION N/A 03/05/2020   Procedure: TRANSESOPHAGEAL ECHOCARDIOGRAM (TEE);  Surgeon: Larey Dresser, MD;  Location: [facility] ENDOSCOPY;  Service: Cardiovascular;  Laterality:  N/A;   Social History   Socioeconomic History  . Marital status: Widowed    Spouse name: Not on file  . Number of children: Not on file  . Years of education: Not on file  . Highest education level: Not on file  Occupational History  . Occupation: retired  Tobacco Use  . Smoking status: Never Smoker  . Smokeless tobacco: Never Used  Vaping Use  . Vaping Use: Never used  Substance and Sexual Activity  . Alcohol use: Not Currently  . Drug use: Never  . Sexual activity: Not Currently  Other Topics Concern  . Not on file  Social History Narrative   ** Merged History Encounter **       ** Merged History Encounter **       Social Determinants of Health   Financial Resource Strain: Not on file  Food Insecurity: Not on file  Transportation Needs: Not on file  Physical Activity: Not on file  Stress: Not on file  Social Connections: Not on file   Family History  Problem Relation Age of Onset  . Heart disease Mother        RF  . Stroke Father   . Diabetes Father   . Hyperlipidemia Father   . Hypertension Father    Allergies  Allergen Reactions  . Penicillin G Nausea Only    Has patient had a  PCN reaction causing immediate rash, facial/tongue/throat swelling, SOB or lightheadedness with hypotension:No Has patient had a PCN reaction causing severe rash involving mucus membranes or skin necrosis: No Has patient had a PCN reaction that required hospitalization: No Has patient had a PCN reaction occurring within the last 10 years: No--NAUSEA ONLY If all of the above answers are "NO", then may proceed with Cephalosporin use.   Marland Kitchen Penicillins   . Penicillins Nausea Only   Prior to Admission medications   Medication Sig Start Date End Date Taking? Authorizing Provider  acetaminophen (TYLENOL) 500 MG tablet Take 1,000 mg by mouth every 6 (six) hours as needed (FOR PAIN.).    [provider]  albuterol (PROAIR HFA) 108 (90 Base) MCG/ACT inhaler Inhale 2 puffs into the  lungs every 6 (six) hours as needed for wheezing or shortness of breath. 11/08/19   Martinique, Betty G, MD  albuterol (VENTOLIN HFA) 108 (90 Base) MCG/ACT inhaler Inhale 2 puffs into the lungs every 6 (six) hours as needed for wheezing. 03/02/20   [provider]  apixaban (ELIQUIS) 5 MG TABS tablet Take 1 tablet (5 mg total) by mouth 2 (two) times daily. 03/09/20   Clegg, Amy D, NP  balsalazide (COLAZAL) 750 MG capsule Take 2 capsules (1,500 mg total) by mouth 2 (two) times daily. 01/30/20   Zehr, Laban Emperor, PA-C  balsalazide (COLAZAL) 750 MG capsule Take 1,500 mg by mouth 2 (two) times daily. 03/02/20   [provider]  calcium carbonate (OSCAL) 1500 (600 Ca) MG TABS tablet Take 2 tablets by mouth daily. 02/21/20   [provider]  Calcium Carbonate-Vitamin D (CALTRATE 600+D PO) Take 2 tablets by mouth daily.    [provider]  Cholecalciferol (VITAMIN D3) 50 MCG (2000 UT) capsule TAKE 1 CAPSULE BY MOUTH EVERY DAY Patient taking differently: Take 2,000 Units by mouth daily. 01/20/20   Martinique, Betty G, MD  D3 HIGH POTENCY 50 MCG (2000 UT) CAPS Take 1 capsule by mouth daily. 02/21/20   [provider]  ELIQUIS 5 MG TABS tablet Take 5 mg by mouth 2 (two) times daily. 03/09/20   [provider]  esomeprazole (NEXIUM) 20 MG capsule Take 20 mg by mouth daily.    [provider]  flecainide (TAMBOCOR) 100 MG tablet Take 1 tablet (100 mg total) by mouth every 12 (twelve) hours. 03/09/20   Clegg, Amy D, NP  flecainide (TAMBOCOR) 100 MG tablet Take 100 mg by mouth every 12 (twelve) hours. 03/09/20   [provider]  metoprolol succinate (TOPROL-XL) 50 MG 24 hr tablet Take 1 tablet (50 mg total) by mouth 2 (two) times daily. Take with or immediately following a meal. 03/09/20   Clegg, Amy D, NP  metoprolol succinate (TOPROL-XL) 50 MG 24 hr tablet Take 50 mg by mouth 2 (two) times daily. 03/09/20   [provider]  omeprazole (PRILOSEC) 20 MG  capsule Take 1 capsule (20 mg total) by mouth daily before breakfast. 07/02/19   Martinique, Betty G, MD  traZODone (DESYREL) 100 MG tablet TAKE 1/2 TABLET BY MOUTH NIGHTLY AT BEDTIME Patient taking differently: Take 100 mg by mouth at bedtime. 02/04/20   [place], Betty G, MD  traZODone (DESYREL) 50 MG tablet Take 50 mg by mouth at bedtime.    [provider]  venlafaxine XR (EFFEXOR-XR) 150 MG 24 hr capsule Take 150 mg by mouth every morning. 03/12/20   [provider]  venlafaxine XR (EFFEXOR-XR) 75 MG 24 hr capsule Take 2  capsules (150 mg total) by mouth daily with breakfast. Patient taking differently: Take 75 mg by mouth daily with breakfast. 02/04/20   Martinique, Betty G, MD  vitamin B-12 (CYANOCOBALAMIN) 1000 MCG tablet Take 1 tablet (1,000 mcg total) by mouth daily. 01/20/20   [place], Betty G, MD  vitamin B-12 (CYANOCOBALAMIN) 1000 MCG tablet Take 1,000 mcg by mouth daily. 02/21/20   [provider]     Positive ROS: Otherwise negative  All other systems have been reviewed and were otherwise negative with the exception of those mentioned in the HPI and as above.  Physical Exam: Constitutional: Alert, well-appearing, no acute distress Ears: External ears without lesions or tenderness. Ear canals are clear bilaterally with intact, clear TMs.  Nasal: External nose without lesions.  Externally minimal swelling noted today.  Bones are slightly tender to palpation but minimal displacement.  Intranasal exam reveals septum to be relatively midline with no evidence of septal hematoma.  She has some crusting in the nose that was removed with suction.  Nasal passages are otherwise clear bilaterally with no difficulty breathing through her nose. Oral: Lips and gums without lesions. Tongue and palate mucosa without lesions. Posterior oropharynx clear. Neck: No palpable adenopathy or masses Respiratory: Breathing comfortably  Skin: No facial/neck lesions or rash  noted.  Procedures  Assessment: Minimal displacement of nasal bone fracture with no difficulty breathing through her nose.  No evidence of septal hematoma or intranasal infection.  Plan: No further therapy is needed as she should do well.   Radene Journey, MD   CC:

## 2020-05-08 DIAGNOSIS — G4701 Insomnia due to medical condition: Secondary | ICD-10-CM | POA: Diagnosis not present

## 2020-05-08 DIAGNOSIS — G3184 Mild cognitive impairment, so stated: Secondary | ICD-10-CM | POA: Diagnosis not present

## 2020-05-08 DIAGNOSIS — F331 Major depressive disorder, recurrent, moderate: Secondary | ICD-10-CM | POA: Diagnosis not present

## 2020-05-08 DIAGNOSIS — F064 Anxiety disorder due to known physiological condition: Secondary | ICD-10-CM | POA: Diagnosis not present

## 2020-05-12 DIAGNOSIS — R296 Repeated falls: Secondary | ICD-10-CM | POA: Diagnosis not present

## 2020-05-12 DIAGNOSIS — M6281 Muscle weakness (generalized): Secondary | ICD-10-CM | POA: Diagnosis not present

## 2020-05-14 DIAGNOSIS — R451 Restlessness and agitation: Secondary | ICD-10-CM | POA: Diagnosis not present

## 2020-05-14 DIAGNOSIS — I10 Essential (primary) hypertension: Secondary | ICD-10-CM | POA: Diagnosis not present

## 2020-05-14 DIAGNOSIS — F0391 Unspecified dementia with behavioral disturbance: Secondary | ICD-10-CM | POA: Diagnosis not present

## 2020-05-14 DIAGNOSIS — F411 Generalized anxiety disorder: Secondary | ICD-10-CM | POA: Diagnosis not present

## 2020-05-17 ENCOUNTER — Emergency Department (HOSPITAL_COMMUNITY): Payer: PPO

## 2020-05-17 ENCOUNTER — Other Ambulatory Visit: Payer: Self-pay

## 2020-05-17 ENCOUNTER — Encounter (HOSPITAL_COMMUNITY): Payer: Self-pay

## 2020-05-17 ENCOUNTER — Emergency Department (HOSPITAL_COMMUNITY)
Admission: EM | Admit: 2020-05-17 | Discharge: 2020-05-17 | Disposition: A | Discharge status: Home / Self Care | Payer: PPO | Attending: Emergency Medicine | Admitting: Emergency Medicine

## 2020-05-17 DIAGNOSIS — W19XXXA Unspecified fall, initial encounter: Secondary | ICD-10-CM | POA: Diagnosis not present

## 2020-05-17 DIAGNOSIS — S0181XA Laceration without foreign body of other part of head, initial encounter: Secondary | ICD-10-CM | POA: Insufficient documentation

## 2020-05-17 DIAGNOSIS — R410 Disorientation, unspecified: Secondary | ICD-10-CM | POA: Insufficient documentation

## 2020-05-17 DIAGNOSIS — Z043 Encounter for examination and observation following other accident: Secondary | ICD-10-CM | POA: Diagnosis not present

## 2020-05-17 DIAGNOSIS — S0993XA Unspecified injury of face, initial encounter: Secondary | ICD-10-CM | POA: Diagnosis present

## 2020-05-17 DIAGNOSIS — F0390 Unspecified dementia without behavioral disturbance: Secondary | ICD-10-CM | POA: Insufficient documentation

## 2020-05-17 DIAGNOSIS — M5021 Other cervical disc displacement,  high cervical region: Secondary | ICD-10-CM | POA: Diagnosis not present

## 2020-05-17 DIAGNOSIS — S01112A Laceration without foreign body of left eyelid and periocular area, initial encounter: Secondary | ICD-10-CM | POA: Diagnosis not present

## 2020-05-17 DIAGNOSIS — I10 Essential (primary) hypertension: Secondary | ICD-10-CM | POA: Diagnosis not present

## 2020-05-17 DIAGNOSIS — R404 Transient alteration of awareness: Secondary | ICD-10-CM | POA: Diagnosis not present

## 2020-05-17 HISTORY — DX: Anxiety disorder, unspecified: F41.9

## 2020-05-17 HISTORY — DX: Insomnia, unspecified: G47.00

## 2020-05-17 HISTORY — DX: Gastro-esophageal reflux disease without esophagitis: K21.9

## 2020-05-17 HISTORY — DX: Essential (primary) hypertension: I10

## 2020-05-17 HISTORY — DX: Cognitive communication deficit: R41.841

## 2020-05-17 HISTORY — DX: Unspecified atrial fibrillation: I48.91

## 2020-05-17 HISTORY — DX: Chronic fatigue, unspecified: R53.82

## 2020-05-17 HISTORY — DX: Vitamin D deficiency, unspecified: E55.9

## 2020-05-17 HISTORY — DX: Vitamin B12 deficiency anemia, unspecified: D51.9

## 2020-05-17 HISTORY — DX: Muscle weakness (generalized): M62.81

## 2020-05-17 HISTORY — DX: Repeated falls: R29.6

## 2020-05-17 HISTORY — DX: Major depressive disorder, single episode, unspecified: F32.9

## 2020-05-17 HISTORY — DX: Crohn's disease, unspecified, without complications: K50.90

## 2020-05-17 LAB — COMPREHENSIVE METABOLIC PANEL
ALT: 16 U/L (ref 0–44)
AST: 21 U/L (ref 15–41)
Albumin: 2.8 g/dL — ABNORMAL LOW (ref 3.5–5.0)
Alkaline Phosphatase: 75 U/L (ref 38–126)
Anion gap: 6 (ref 5–15)
BUN: 16 mg/dL (ref 8–23)
CO2: 27 mmol/L (ref 22–32)
Calcium: 8.9 mg/dL (ref 8.9–10.3)
Chloride: 104 mmol/L (ref 98–111)
Creatinine, Ser: 1.56 mg/dL — ABNORMAL HIGH (ref 0.44–1.00)
GFR, Estimated: 34 mL/min — ABNORMAL LOW (ref >60)
Glucose, Bld: 130 mg/dL — ABNORMAL HIGH (ref 70–99)
Potassium: 3.8 mmol/L (ref 3.5–5.1)
Sodium: 137 mmol/L (ref 135–145)
Total Bilirubin: 0.9 mg/dL (ref 0.3–1.2)
Total Protein: 5.8 g/dL — ABNORMAL LOW (ref 6.5–8.1)

## 2020-05-17 MED ORDER — LIDOCAINE-EPINEPHRINE (PF) 2 %-1:200000 IJ SOLN
20.0000 mL | Freq: Once | INTRAMUSCULAR | Status: DC
  Filled 2020-05-17: qty 20

## 2020-05-17 NOTE — ED Notes (Signed)
Patient transported to CT 

## 2020-05-17 NOTE — ED Notes (Signed)
PTAR called  

## 2020-05-17 NOTE — ED Provider Notes (Signed)
[facility] EMERGENCY DEPARTMENT Provider Note  CSN: [id number] Arrival date & time: 05/17/20 4174  Chief Complaint(s) Fall  HPI Kristen Gray is a 76 y.o. female here for unwitnessed fall had a nursing facility.  Patient is on blood thinners.  At baseline she has dementia and confusion oriented to self only.  She sustained laceration to the left eyebrow.  Denies any headache, neck pain, back pain, chest pain, extremity pain.  Remainder of history, ROS, and physical exam limited due to patient's condition (dementia). Additional information was obtained from EMS.   Level V Caveat.    HPI  Past Medical History Past Medical History:  Diagnosis Date  . Anxiety disorder, unspecified   . Atrial fibrillation ([place])   . Chronic fatigue, unspecified   . Cognitive communication deficit   . Crohn's disease (regional enteritis) ([place])   . Gastroesophageal reflux disease without esophagitis   . Hypertension   . Insomnia, unspecified   . Major depressive disorder   . Muscle weakness (generalized)   . Repeated falls   . Vitamin B12 deficiency anemia, unspecified   . Vitamin D deficiency, unspecified    There are no problems to display for this patient.  Home Medication(s) Prior to Admission medications   Not on File                                                                                                                                    Past Surgical History History reviewed. No pertinent surgical history. Family History History reviewed. No pertinent family history.  Social History Social History   Tobacco Use  . Smoking status: Unknown If Ever Smoked  Vaping Use  . Vaping Use: Unknown  Substance Use Topics  . Alcohol use: Not Currently  . Drug use: Never   Allergies Penicillins  Review of Systems Review of Systems  Unable to perform ROS: Dementia    Physical Exam Vital Signs  I have reviewed the triage vital signs BP (!) 148/63   Pulse  69   Temp 98.1 F (36.7 C) (Temporal)   Resp 16   Ht 5' 3"  (1.6 m)   Wt 52.2 kg   SpO2 99%   BMI 20.37 kg/m   Physical Exam Constitutional:      General: She is not in acute distress.    Appearance: She is well-developed. She is not diaphoretic.  HENT:     Head: Normocephalic. Laceration present.      Right Ear: External ear normal.     Left Ear: External ear normal.     Nose: Nose normal.  Eyes:     General: No scleral icterus.       Right eye: No discharge.        Left eye: No discharge.     Conjunctiva/sclera: Conjunctivae normal.     Pupils: Pupils are equal, round, and reactive to light.  Cardiovascular:  Rate and Rhythm: Normal rate and regular rhythm.     Pulses:          Radial pulses are 2+ on the right side and 2+ on the left side.       Dorsalis pedis pulses are 2+ on the right side and 2+ on the left side.     Heart sounds: Normal heart sounds. No murmur heard. No friction rub. No gallop.   Pulmonary:     Effort: Pulmonary effort is normal. No respiratory distress.     Breath sounds: Normal breath sounds. No stridor. No wheezing.  Abdominal:     General: There is no distension.     Palpations: Abdomen is soft.     Tenderness: There is no abdominal tenderness.  Musculoskeletal:        General: No tenderness.     Cervical back: Normal range of motion and neck supple. No bony tenderness.     Thoracic back: No bony tenderness.     Lumbar back: No bony tenderness.     Comments: Clavicles stable. Chest stable to AP/Lat compression. Pelvis stable to Lat compression. No obvious extremity deformity. No chest or abdominal wall contusion.  Skin:    General: Skin is warm and dry.     Findings: No erythema or rash.  Neurological:     Mental Status: She is alert and oriented to person, place, and time.     Comments: Moving all extremities     ED Results and Treatments Labs (all labs ordered are listed, but only abnormal results are displayed) Labs  Reviewed  COMPREHENSIVE METABOLIC PANEL - Abnormal; Notable for the following components:      Result Value   Glucose, Bld 130 (*)    Creatinine, Ser 1.56 (*)    Total Protein 5.8 (*)    Albumin 2.8 (*)    GFR, Estimated 34 (*)    All other components within normal limits  CBC                                                                                                                         EKG  EKG Interpretation  Date/Time:  Sunday May 17 2020 01:06:43 EDT Ventricular Rate:  67 PR Interval:  206 QRS Duration: 102 QT Interval:  484 QTC Calculation: 511 R Axis:   53 Text Interpretation: Sinus rhythm Consider left atrial enlargement Prolonged QT interval No old tracing to compare Confirmed by Addison Lank [id number]) on 05/17/2020 3:08:49 AM      Radiology CT HEAD WO CONTRAST  Result Date: 05/17/2020 CLINICAL DATA:  Fall EXAM: CT HEAD WITHOUT CONTRAST CT CERVICAL SPINE WITHOUT CONTRAST TECHNIQUE: Multidetector CT imaging of the head and cervical spine was performed following the standard protocol without intravenous contrast. Multiplanar CT image reconstructions of the cervical spine were also generated. COMPARISON:  None. FINDINGS: CT HEAD FINDINGS Brain: There is no mass, hemorrhage or extra-axial collection. The size and configuration of the ventricles and extra-axial CSF  spaces are normal. There is hypoattenuation of the periventricular white matter, most commonly indicating chronic ischemic microangiopathy. Vascular: No abnormal hyperdensity of the major intracranial arteries or dural venous sinuses. No intracranial atherosclerosis. Skull: The visualized skull base, calvarium and extracranial soft tissues are normal. Sinuses/Orbits: No fluid levels or advanced mucosal thickening of the visualized paranasal sinuses. No mastoid or middle ear effusion. The orbits are normal. CT CERVICAL SPINE FINDINGS Alignment: No static subluxation. Facets are aligned. Occipital condyles are  normally positioned. Skull base and vertebrae: No acute fracture. Soft tissues and spinal canal: No prevertebral fluid or swelling. No visible canal hematoma. Disc levels: Small central disc protrusion at C3-4. Upper chest: No pneumothorax, pulmonary nodule or pleural effusion. Other: Normal visualized paraspinal cervical soft tissues. IMPRESSION: 1. Chronic ischemic microangiopathy without acute intracranial abnormality. 2. No acute fracture or static subluxation of the cervical spine. Electronically Signed   By: Ulyses Jarred M.D.   On: 05/17/2020 01:50   CT CERVICAL SPINE WO CONTRAST  Result Date: 05/17/2020 CLINICAL DATA:  Fall EXAM: CT HEAD WITHOUT CONTRAST CT CERVICAL SPINE WITHOUT CONTRAST TECHNIQUE: Multidetector CT imaging of the head and cervical spine was performed following the standard protocol without intravenous contrast. Multiplanar CT image reconstructions of the cervical spine were also generated. COMPARISON:  None. FINDINGS: CT HEAD FINDINGS Brain: There is no mass, hemorrhage or extra-axial collection. The size and configuration of the ventricles and extra-axial CSF spaces are normal. There is hypoattenuation of the periventricular white matter, most commonly indicating chronic ischemic microangiopathy. Vascular: No abnormal hyperdensity of the major intracranial arteries or dural venous sinuses. No intracranial atherosclerosis. Skull: The visualized skull base, calvarium and extracranial soft tissues are normal. Sinuses/Orbits: No fluid levels or advanced mucosal thickening of the visualized paranasal sinuses. No mastoid or middle ear effusion. The orbits are normal. CT CERVICAL SPINE FINDINGS Alignment: No static subluxation. Facets are aligned. Occipital condyles are normally positioned. Skull base and vertebrae: No acute fracture. Soft tissues and spinal canal: No prevertebral fluid or swelling. No visible canal hematoma. Disc levels: Small central disc protrusion at C3-4. Upper chest: No  pneumothorax, pulmonary nodule or pleural effusion. Other: Normal visualized paraspinal cervical soft tissues. IMPRESSION: 1. Chronic ischemic microangiopathy without acute intracranial abnormality. 2. No acute fracture or static subluxation of the cervical spine. Electronically Signed   By: Ulyses Jarred M.D.   On: 05/17/2020 01:50    Pertinent labs & imaging results that were available during my care of the patient were reviewed by me and considered in my medical decision making (see chart for details).  Medications Ordered in ED Medications  lidocaine-EPINEPHrine (XYLOCAINE W/EPI) 2 %-1:200000 (PF) injection 20 mL (has no administration in time range)                                                                                                                                    Procedures .1-3 Lead EKG Interpretation Performed  by: Fatima Blank, MD Authorized by: Fatima Blank, MD     Interpretation: normal     ECG rate:  71   ECG rate assessment: normal     Rhythm: sinus rhythm     Ectopy: none     Conduction: normal   ..Laceration Repair  Date/Time: 05/17/2020 3:38 AM Performed by: Fatima Blank, MD Authorized by: Fatima Blank, MD   Consent:    Consent obtained:  Verbal   Consent given by:  Patient   Risks discussed:  Poor cosmetic result, pain, need for additional repair and infection Universal protocol:    Procedure explained and questions answered to patient or proxy's satisfaction: yes     Relevant documents present and verified: yes     Patient identity confirmed:  Arm band Anesthesia:    Anesthesia method:  None Laceration details:    Location:  Face   Face location:  L eyebrow   Length (cm):  2   Depth (mm):  4 Pre-procedure details:    Preparation:  Patient was prepped and draped in usual sterile fashion and imaging obtained to evaluate for foreign bodies Exploration:    Hemostasis achieved with:  Direct pressure    Imaging outcome: foreign body not noted     Wound exploration: wound explored through full range of motion and entire depth of wound visualized     Wound extent: no fascia violation noted, no foreign bodies/material noted, no muscle damage noted, no tendon damage noted and no vascular damage noted   Treatment:    Area cleansed with:  Saline   Amount of cleaning:  Standard   Irrigation solution:  Sterile saline   Irrigation volume:  300cc   Debridement:  None   Undermining:  None Skin repair:    Repair method:  Steri-Strips   Number of Steri-Strips:  3 Approximation:    Approximation:  Close Repair type:    Repair type:  Simple Post-procedure details:    Procedure completion:  Tolerated well, no immediate complications    (including critical care time)  Medical Decision Making / ED Course I have reviewed the nursing notes for this encounter and the patient's prior records (if available in EHR or on provided paperwork).   Kristen Gray was evaluated in Emergency Department on 05/17/2020 for the symptoms described in the history of present illness. She was evaluated in the context of the global COVID-19 pandemic, which necessitated consideration that the patient might be at risk for infection with the SARS-CoV-2 virus that causes COVID-19. Institutional protocols and algorithms that pertain to the evaluation of patients at risk for COVID-19 are in a state of rapid change based on information released by regulatory bodies including the CDC and federal and state organizations. These policies and algorithms were followed during the patient's care in the ED.  Unwitnessed fall. Head trauma. On blood thinners. Resulted in laceration of the left eyebrow. No other injuries noted on exam. We will obtain a CT head and cervical spine.  Imaging negative for acute injury. Laceration irrigated and closed as above.      Final Clinical Impression(s) / ED Diagnoses Final diagnoses:  Fall,  initial encounter  Facial laceration, initial encounter    The patient appears reasonably screened and/or stabilized for discharge and I doubt any other medical condition or other [facility] requiring further screening, evaluation, or treatment in the ED at this time prior to discharge. Safe for discharge with strict return precautions.  Disposition: Discharge  Condition: Good  I have discussed the results, Dx and Tx plan with the patient/family who expressed understanding and agree(s) with the plan. Discharge instructions discussed at length. The patient/family was given strict return precautions who verbalized understanding of the instructions. No further questions at time of discharge.    ED Discharge Orders    None       Follow Up: Primary care provider  Call  if you have not been called about your appointment     This chart was dictated using voice recognition software.  Despite best efforts to proofread,  errors can occur which can change the documentation meaning.   Fatima Blank, MD 05/17/20 [phone]

## 2020-05-17 NOTE — ED Triage Notes (Signed)
Pt bib [facility] from [facility] after witnessed fall on blood thinners (Eliquis). Pt fell and hit LT side of head on bedside table and has lac to LT eyebrow (bleeding controlled). EMS unable to get c-collar on pt due to refusal. Pt was ambulatory at facility and used bathroom PTA. Pt alert to self only.

## 2020-05-18 ENCOUNTER — Encounter (HOSPITAL_COMMUNITY): Payer: Self-pay | Admitting: Cardiology

## 2020-05-21 DIAGNOSIS — D649 Anemia, unspecified: Secondary | ICD-10-CM | POA: Diagnosis not present

## 2020-05-21 DIAGNOSIS — I272 Pulmonary hypertension, unspecified: Secondary | ICD-10-CM | POA: Diagnosis not present

## 2020-05-21 DIAGNOSIS — Z6824 Body mass index (BMI) 24.0-24.9, adult: Secondary | ICD-10-CM | POA: Diagnosis not present

## 2020-05-21 DIAGNOSIS — F331 Major depressive disorder, recurrent, moderate: Secondary | ICD-10-CM | POA: Diagnosis not present

## 2020-05-21 DIAGNOSIS — K219 Gastro-esophageal reflux disease without esophagitis: Secondary | ICD-10-CM | POA: Diagnosis not present

## 2020-05-21 DIAGNOSIS — I4891 Unspecified atrial fibrillation: Secondary | ICD-10-CM | POA: Diagnosis not present

## 2020-05-21 DIAGNOSIS — G4700 Insomnia, unspecified: Secondary | ICD-10-CM | POA: Diagnosis not present

## 2020-05-21 DIAGNOSIS — K5090 Crohn's disease, unspecified, without complications: Secondary | ICD-10-CM | POA: Diagnosis not present

## 2020-05-21 DIAGNOSIS — F411 Generalized anxiety disorder: Secondary | ICD-10-CM | POA: Diagnosis not present

## 2020-05-21 DIAGNOSIS — I480 Paroxysmal atrial fibrillation: Secondary | ICD-10-CM | POA: Diagnosis not present

## 2020-05-21 DIAGNOSIS — M6281 Muscle weakness (generalized): Secondary | ICD-10-CM | POA: Diagnosis not present

## 2020-05-21 DIAGNOSIS — E538 Deficiency of other specified B group vitamins: Secondary | ICD-10-CM | POA: Diagnosis not present

## 2020-05-21 DIAGNOSIS — G4733 Obstructive sleep apnea (adult) (pediatric): Secondary | ICD-10-CM | POA: Diagnosis not present

## 2020-05-21 DIAGNOSIS — Z7901 Long term (current) use of anticoagulants: Secondary | ICD-10-CM | POA: Diagnosis not present

## 2020-05-21 DIAGNOSIS — M150 Primary generalized (osteo)arthritis: Secondary | ICD-10-CM | POA: Diagnosis not present

## 2020-05-21 DIAGNOSIS — I10 Essential (primary) hypertension: Secondary | ICD-10-CM | POA: Diagnosis not present

## 2020-05-21 DIAGNOSIS — E876 Hypokalemia: Secondary | ICD-10-CM | POA: Diagnosis not present

## 2020-05-21 DIAGNOSIS — R296 Repeated falls: Secondary | ICD-10-CM | POA: Diagnosis not present

## 2020-05-21 DIAGNOSIS — E559 Vitamin D deficiency, unspecified: Secondary | ICD-10-CM | POA: Diagnosis not present

## 2020-05-25 DIAGNOSIS — I272 Pulmonary hypertension, unspecified: Secondary | ICD-10-CM | POA: Diagnosis not present

## 2020-05-25 DIAGNOSIS — I480 Paroxysmal atrial fibrillation: Secondary | ICD-10-CM | POA: Diagnosis not present

## 2020-05-25 DIAGNOSIS — Z7901 Long term (current) use of anticoagulants: Secondary | ICD-10-CM | POA: Diagnosis not present

## 2020-05-25 DIAGNOSIS — G4700 Insomnia, unspecified: Secondary | ICD-10-CM | POA: Diagnosis not present

## 2020-05-25 DIAGNOSIS — F331 Major depressive disorder, recurrent, moderate: Secondary | ICD-10-CM | POA: Diagnosis not present

## 2020-05-25 DIAGNOSIS — K219 Gastro-esophageal reflux disease without esophagitis: Secondary | ICD-10-CM | POA: Diagnosis not present

## 2020-05-25 DIAGNOSIS — I10 Essential (primary) hypertension: Secondary | ICD-10-CM | POA: Diagnosis not present

## 2020-05-25 DIAGNOSIS — E559 Vitamin D deficiency, unspecified: Secondary | ICD-10-CM | POA: Diagnosis not present

## 2020-05-25 DIAGNOSIS — E876 Hypokalemia: Secondary | ICD-10-CM | POA: Diagnosis not present

## 2020-05-25 DIAGNOSIS — R296 Repeated falls: Secondary | ICD-10-CM | POA: Diagnosis not present

## 2020-05-25 DIAGNOSIS — D649 Anemia, unspecified: Secondary | ICD-10-CM | POA: Diagnosis not present

## 2020-05-25 DIAGNOSIS — E538 Deficiency of other specified B group vitamins: Secondary | ICD-10-CM | POA: Diagnosis not present

## 2020-05-25 DIAGNOSIS — F0391 Unspecified dementia with behavioral disturbance: Secondary | ICD-10-CM | POA: Diagnosis not present

## 2020-05-25 DIAGNOSIS — F411 Generalized anxiety disorder: Secondary | ICD-10-CM | POA: Diagnosis not present

## 2020-05-25 DIAGNOSIS — Z6824 Body mass index (BMI) 24.0-24.9, adult: Secondary | ICD-10-CM | POA: Diagnosis not present

## 2020-05-25 DIAGNOSIS — G4733 Obstructive sleep apnea (adult) (pediatric): Secondary | ICD-10-CM | POA: Diagnosis not present

## 2020-05-25 DIAGNOSIS — M150 Primary generalized (osteo)arthritis: Secondary | ICD-10-CM | POA: Diagnosis not present

## 2020-05-25 DIAGNOSIS — K5090 Crohn's disease, unspecified, without complications: Secondary | ICD-10-CM | POA: Diagnosis not present

## 2020-05-25 DIAGNOSIS — I4891 Unspecified atrial fibrillation: Secondary | ICD-10-CM | POA: Diagnosis not present

## 2020-05-28 DIAGNOSIS — E782 Mixed hyperlipidemia: Secondary | ICD-10-CM | POA: Diagnosis not present

## 2020-05-28 DIAGNOSIS — E119 Type 2 diabetes mellitus without complications: Secondary | ICD-10-CM | POA: Diagnosis not present

## 2020-05-28 DIAGNOSIS — I10 Essential (primary) hypertension: Secondary | ICD-10-CM | POA: Diagnosis not present

## 2020-05-28 DIAGNOSIS — E039 Hypothyroidism, unspecified: Secondary | ICD-10-CM | POA: Diagnosis not present

## 2020-05-29 ENCOUNTER — Emergency Department (HOSPITAL_COMMUNITY)
Admission: EM | Admit: 2020-05-29 | Discharge: 2020-05-29 | Disposition: A | Discharge status: Home / Self Care | Payer: PPO | Attending: Emergency Medicine | Admitting: Emergency Medicine

## 2020-05-29 ENCOUNTER — Emergency Department (HOSPITAL_COMMUNITY): Payer: PPO

## 2020-05-29 DIAGNOSIS — W19XXXA Unspecified fall, initial encounter: Secondary | ICD-10-CM

## 2020-05-29 DIAGNOSIS — Z7901 Long term (current) use of anticoagulants: Secondary | ICD-10-CM | POA: Insufficient documentation

## 2020-05-29 DIAGNOSIS — F064 Anxiety disorder due to known physiological condition: Secondary | ICD-10-CM | POA: Diagnosis not present

## 2020-05-29 DIAGNOSIS — M2550 Pain in unspecified joint: Secondary | ICD-10-CM | POA: Diagnosis not present

## 2020-05-29 DIAGNOSIS — I10 Essential (primary) hypertension: Secondary | ICD-10-CM | POA: Diagnosis not present

## 2020-05-29 DIAGNOSIS — M50221 Other cervical disc displacement at C4-C5 level: Secondary | ICD-10-CM | POA: Diagnosis not present

## 2020-05-29 DIAGNOSIS — M4312 Spondylolisthesis, cervical region: Secondary | ICD-10-CM | POA: Diagnosis not present

## 2020-05-29 DIAGNOSIS — Z23 Encounter for immunization: Secondary | ICD-10-CM | POA: Diagnosis not present

## 2020-05-29 DIAGNOSIS — G3184 Mild cognitive impairment, so stated: Secondary | ICD-10-CM | POA: Diagnosis not present

## 2020-05-29 DIAGNOSIS — W01198A Fall on same level from slipping, tripping and stumbling with subsequent striking against other object, initial encounter: Secondary | ICD-10-CM | POA: Diagnosis not present

## 2020-05-29 DIAGNOSIS — F0391 Unspecified dementia with behavioral disturbance: Secondary | ICD-10-CM | POA: Diagnosis not present

## 2020-05-29 DIAGNOSIS — S0083XA Contusion of other part of head, initial encounter: Secondary | ICD-10-CM | POA: Diagnosis not present

## 2020-05-29 DIAGNOSIS — F331 Major depressive disorder, recurrent, moderate: Secondary | ICD-10-CM | POA: Diagnosis not present

## 2020-05-29 DIAGNOSIS — R9431 Abnormal electrocardiogram [ECG] [EKG]: Secondary | ICD-10-CM | POA: Diagnosis not present

## 2020-05-29 DIAGNOSIS — R58 Hemorrhage, not elsewhere classified: Secondary | ICD-10-CM | POA: Diagnosis not present

## 2020-05-29 DIAGNOSIS — S0003XA Contusion of scalp, initial encounter: Secondary | ICD-10-CM | POA: Insufficient documentation

## 2020-05-29 DIAGNOSIS — G4701 Insomnia due to medical condition: Secondary | ICD-10-CM | POA: Diagnosis not present

## 2020-05-29 DIAGNOSIS — M5021 Other cervical disc displacement,  high cervical region: Secondary | ICD-10-CM | POA: Diagnosis not present

## 2020-05-29 DIAGNOSIS — S199XXA Unspecified injury of neck, initial encounter: Secondary | ICD-10-CM | POA: Diagnosis not present

## 2020-05-29 DIAGNOSIS — I6782 Cerebral ischemia: Secondary | ICD-10-CM | POA: Diagnosis not present

## 2020-05-29 DIAGNOSIS — Z7401 Bed confinement status: Secondary | ICD-10-CM | POA: Diagnosis not present

## 2020-05-29 DIAGNOSIS — S0990XA Unspecified injury of head, initial encounter: Secondary | ICD-10-CM

## 2020-05-29 MED ORDER — TETANUS-DIPHTH-ACELL PERTUSSIS 5-2.5-18.5 LF-MCG/0.5 IM SUSY
0.5000 mL | PREFILLED_SYRINGE | Freq: Once | INTRAMUSCULAR | Status: AC
  Administered 2020-05-29: 0.5 mL via INTRAMUSCULAR
  Filled 2020-05-29: qty 0.5

## 2020-05-29 NOTE — Progress Notes (Signed)
   05/29/20 0700  Clinical Encounter Type  Visited With Patient not available  Visit Type Trauma  Referral From Nurse  Consult/Referral To Chaplain  Chaplain followed up with patient. Patient is asleep. This chaplain spoke with nurse,Callie who stated the patient will be returning to the facility today. Chaplain remains available if needed. This note was prepared by Jeanine Luz, M.Div..  For questions please contact by phone [phone].

## 2020-05-29 NOTE — ED Triage Notes (Signed)
Unwitnessed fall at [employer] living facility. [address] while attempting to transport self back to wheelchair from bathroom. A&O x4. C/o dizziness with EMS. On Eliquis.   EMS Vitals: 170/80 HR 80 100% RA Pulse 80 CBG 178

## 2020-05-29 NOTE — ED Notes (Signed)
Patient transported to CT 

## 2020-05-29 NOTE — ED Notes (Signed)
[facility]AR here to transport pt.

## 2020-05-29 NOTE — ED Provider Notes (Signed)
Emergency Department Provider Note   I have reviewed the triage vital signs and the nursing notes.   HISTORY  Chief Complaint No chief complaint on file.   HPI Kristen Gray is a 75 y.o. female presents to the emergency department by EMS as a level 2 trauma after falling with head injury on Eliquis.  She lives at a nursing facility.  She got up to use the bathroom upon sitting down and states that the chair slid from under her and she fell backwards hitting the back of her head.  No loss of consciousness.  EMS report patient is at her mental status baseline, conversational, pleasant.  She has pain in the back of her head but denies pain in other locations.  Denies loss of consciousness.  The fall was not witnessed by staff but patient is able to provide a history.  Denies any unilateral numbness or weakness.  No voice changes.  Denies any pain in the chest, abdomen, pelvis, lower back.    No past medical history on file.  There are no problems to display for this patient.   Allergies Patient has no allergy information on record.  No family history on file.  Social History    Review of Systems  Constitutional: No fever/chills Eyes: No visual changes. ENT: No sore throat. Cardiovascular: Denies chest pain. Respiratory: Denies shortness of breath. Gastrointestinal: No abdominal pain.  No nausea, no vomiting.  No diarrhea.  No constipation. Genitourinary: Negative for dysuria. Musculoskeletal: Negative for back pain. Skin: Abrasion to the posterior scalp.  Neurological: Negative for focal weakness or numbness. Positive HA.   10-point ROS otherwise negative.  ____________________________________________   PHYSICAL EXAM:  VITAL SIGNS: ED Triage Vitals  Enc Vitals Group     BP 05/29/20 0310 (!) 190/90     Pulse Rate 05/29/20 0314 91     Resp 05/29/20 0314 19     Temp 05/29/20 0314 98.6 F (37 C)     Temp Source 05/29/20 0314 Oral     SpO2 05/29/20 0314 98 %      Weight 05/29/20 0312 140 lb (63.5 kg)     Height 05/29/20 0312 4' 9"  (1.448 m)   Constitutional: Alert and oriented. Well appearing and in no acute distress. Eyes: Conjunctivae are normal.  Head: posterior scalp hematoma.  Nose: No congestion/rhinnorhea. Mouth/Throat: Mucous membranes are moist.  Neck: No stridor.  Cardiovascular: Normal rate, regular rhythm. Good peripheral circulation. Grossly normal heart sounds.   Respiratory: Normal respiratory effort.  No retractions. Lungs CTAB. Gastrointestinal: Soft and nontender. No distention.  Musculoskeletal: No lower extremity tenderness nor edema. No gross deformities of extremities. Normal active/passive ROM of the upper and lower extremities.  Neurologic:  Normal speech and language. No gross focal neurologic deficits are appreciated.  Skin:  Skin is warm and dry. Occipital hematoma with abrasion noted. Wound is hemostatic.  ____________________________________________  EKG   EKG Interpretation  Date/Time:  Friday May 29 2020 03:16:18 EDT Ventricular Rate:  91 PR Interval:  193 QRS Duration: 97 QT Interval:  392 QTC Calculation: 483 R Axis:   63 Text Interpretation: Sinus rhythm Consider left atrial enlargement Probable LVH with secondary repol abnrm Confirmed by Nanda Quinton [phone]) on 05/29/2020 3:28:24 AM       ____________________________________________  RADIOLOGY  CT Head Wo Contrast  Result Date: 05/29/2020 CLINICAL DATA:  Neck trauma. EXAM: CT HEAD WITHOUT CONTRAST CT CERVICAL SPINE WITHOUT CONTRAST TECHNIQUE: Multidetector CT imaging of the head and cervical spine  was performed following the standard protocol without intravenous contrast. Multiplanar CT image reconstructions of the cervical spine were also generated. COMPARISON:  05/17/2020 FINDINGS: CT HEAD FINDINGS Brain: No evidence of acute infarction, hemorrhage, hydrocephalus, extra-axial collection or mass lesion/mass effect. Chronic small vessel ischemia in the  cerebral white matter which is at least moderate. Preserved brain volume for age Vascular: No hyperdense vessel or unexpected calcification. Skull: Right forehead swelling.  No acute fracture. Sinuses/Orbits: No visible injury CT CERVICAL SPINE FINDINGS Alignment: No traumatic malalignment. Degenerative C4-5 anterolisthesis that is mild Skull base and vertebrae: No acute fracture focal bone lesion Soft tissues and spinal canal: No prevertebral fluid or swelling. No visible canal hematoma. Disc levels: Generalized disc degeneration and multilevel facet spurring. Partially calcified disc protrusions seen centrally at C2-3 to C4-5. Prominent ridging at C6-7. Upper chest: No evidence of injury IMPRESSION: 1. No evidence of acute intracranial or cervical spine injury. 2. Forehead hematoma without calvarial fracture. Electronically Signed   By: Monte Fantasia M.D.   On: 05/29/2020 05:08   CT Cervical Spine Wo Contrast  Result Date: 05/29/2020 CLINICAL DATA:  Neck trauma. EXAM: CT HEAD WITHOUT CONTRAST CT CERVICAL SPINE WITHOUT CONTRAST TECHNIQUE: Multidetector CT imaging of the head and cervical spine was performed following the standard protocol without intravenous contrast. Multiplanar CT image reconstructions of the cervical spine were also generated. COMPARISON:  05/17/2020 FINDINGS: CT HEAD FINDINGS Brain: No evidence of acute infarction, hemorrhage, hydrocephalus, extra-axial collection or mass lesion/mass effect. Chronic small vessel ischemia in the cerebral white matter which is at least moderate. Preserved brain volume for age Vascular: No hyperdense vessel or unexpected calcification. Skull: Right forehead swelling.  No acute fracture. Sinuses/Orbits: No visible injury CT CERVICAL SPINE FINDINGS Alignment: No traumatic malalignment. Degenerative C4-5 anterolisthesis that is mild Skull base and vertebrae: No acute fracture focal bone lesion Soft tissues and spinal canal: No prevertebral fluid or swelling. No  visible canal hematoma. Disc levels: Generalized disc degeneration and multilevel facet spurring. Partially calcified disc protrusions seen centrally at C2-3 to C4-5. Prominent ridging at C6-7. Upper chest: No evidence of injury IMPRESSION: 1. No evidence of acute intracranial or cervical spine injury. 2. Forehead hematoma without calvarial fracture. Electronically Signed   By: Monte Fantasia M.D.   On: 05/29/2020 05:08    ____________________________________________   PROCEDURES  Procedure(s) performed:   Procedures  None  ____________________________________________   INITIAL IMPRESSION / ASSESSMENT AND PLAN / ED COURSE  Pertinent labs & imaging results that were available during my care of the patient were reviewed by me and considered in my medical decision making (see chart for details).   Patient arrives to the emergency department as a level 2 trauma after a fall on blood thinners.  She has a occipital scalp hematoma with small abrasion.  Reviewed the merged chart and I do not see a recent tetanus shot.  Patient is unsure of her last tetanus.  Plan for updating labs along with CT imaging of the head and cervical spine.  EKG interpreted by me as above.  Episode does not appear syncopal and patient gives a good history of slip and fall. No other areas of pain.   CT head and cervical spine with no acute findings.  The patient has an abrasion to the posterior scalp but this does not appear to require suture or staple.  The wound is hemostatic.  She is at her mental status baseline and ready to go home. ____________________________________________  FINAL CLINICAL IMPRESSION(S) / ED  DIAGNOSES  Final diagnoses:  Fall, initial encounter  Injury of head, initial encounter    MEDICATIONS GIVEN DURING THIS VISIT:  Medications  Tdap (BOOSTRIX) injection 0.5 mL (0.5 mLs Intramuscular Given 05/29/20 0321)    Note:  This document was prepared using Dragon voice recognition software and  may include unintentional dictation errors.  Nanda Quinton, MD, [facility] Emergency Medicine    Airabella Barley, Wonda Olds, MD 05/29/20 [phone]

## 2020-05-29 NOTE — Discharge Instructions (Signed)
You were seen in the emergency department today after a fall.  The CT scan of your head looks normal with no bleeding or fracture.  You have some swelling over the scalp which she should keep clean and dry.  You can apply an ice pack to help reduce swelling.

## 2020-05-29 NOTE — ED Notes (Signed)
PTAR called  

## 2020-06-08 DIAGNOSIS — M150 Primary generalized (osteo)arthritis: Secondary | ICD-10-CM | POA: Diagnosis not present

## 2020-06-08 DIAGNOSIS — Z7901 Long term (current) use of anticoagulants: Secondary | ICD-10-CM | POA: Diagnosis not present

## 2020-06-08 DIAGNOSIS — F331 Major depressive disorder, recurrent, moderate: Secondary | ICD-10-CM | POA: Diagnosis not present

## 2020-06-08 DIAGNOSIS — I480 Paroxysmal atrial fibrillation: Secondary | ICD-10-CM | POA: Diagnosis not present

## 2020-06-08 DIAGNOSIS — K5090 Crohn's disease, unspecified, without complications: Secondary | ICD-10-CM | POA: Diagnosis not present

## 2020-06-08 DIAGNOSIS — E559 Vitamin D deficiency, unspecified: Secondary | ICD-10-CM | POA: Diagnosis not present

## 2020-06-08 DIAGNOSIS — D649 Anemia, unspecified: Secondary | ICD-10-CM | POA: Diagnosis not present

## 2020-06-08 DIAGNOSIS — I272 Pulmonary hypertension, unspecified: Secondary | ICD-10-CM | POA: Diagnosis not present

## 2020-06-08 DIAGNOSIS — G4733 Obstructive sleep apnea (adult) (pediatric): Secondary | ICD-10-CM | POA: Diagnosis not present

## 2020-06-08 DIAGNOSIS — K219 Gastro-esophageal reflux disease without esophagitis: Secondary | ICD-10-CM | POA: Diagnosis not present

## 2020-06-08 DIAGNOSIS — G4700 Insomnia, unspecified: Secondary | ICD-10-CM | POA: Diagnosis not present

## 2020-06-08 DIAGNOSIS — F411 Generalized anxiety disorder: Secondary | ICD-10-CM | POA: Diagnosis not present

## 2020-06-08 DIAGNOSIS — I10 Essential (primary) hypertension: Secondary | ICD-10-CM | POA: Diagnosis not present

## 2020-06-08 DIAGNOSIS — E876 Hypokalemia: Secondary | ICD-10-CM | POA: Diagnosis not present

## 2020-06-08 DIAGNOSIS — E538 Deficiency of other specified B group vitamins: Secondary | ICD-10-CM | POA: Diagnosis not present

## 2020-06-08 DIAGNOSIS — Z6824 Body mass index (BMI) 24.0-24.9, adult: Secondary | ICD-10-CM | POA: Diagnosis not present

## 2020-06-08 DIAGNOSIS — I4891 Unspecified atrial fibrillation: Secondary | ICD-10-CM | POA: Diagnosis not present

## 2020-06-20 DIAGNOSIS — R296 Repeated falls: Secondary | ICD-10-CM | POA: Diagnosis not present

## 2020-06-20 DIAGNOSIS — M6281 Muscle weakness (generalized): Secondary | ICD-10-CM | POA: Diagnosis not present

## 2020-06-22 DIAGNOSIS — M25561 Pain in right knee: Secondary | ICD-10-CM | POA: Diagnosis not present

## 2020-06-22 DIAGNOSIS — F0391 Unspecified dementia with behavioral disturbance: Secondary | ICD-10-CM | POA: Diagnosis not present

## 2020-06-22 DIAGNOSIS — R296 Repeated falls: Secondary | ICD-10-CM | POA: Diagnosis not present

## 2020-06-22 DIAGNOSIS — I480 Paroxysmal atrial fibrillation: Secondary | ICD-10-CM | POA: Diagnosis not present

## 2020-06-26 DIAGNOSIS — F064 Anxiety disorder due to known physiological condition: Secondary | ICD-10-CM | POA: Diagnosis not present

## 2020-06-26 DIAGNOSIS — Z6824 Body mass index (BMI) 24.0-24.9, adult: Secondary | ICD-10-CM | POA: Diagnosis not present

## 2020-06-26 DIAGNOSIS — F411 Generalized anxiety disorder: Secondary | ICD-10-CM | POA: Diagnosis not present

## 2020-06-26 DIAGNOSIS — E538 Deficiency of other specified B group vitamins: Secondary | ICD-10-CM | POA: Diagnosis not present

## 2020-06-26 DIAGNOSIS — G4700 Insomnia, unspecified: Secondary | ICD-10-CM | POA: Diagnosis not present

## 2020-06-26 DIAGNOSIS — E559 Vitamin D deficiency, unspecified: Secondary | ICD-10-CM | POA: Diagnosis not present

## 2020-06-26 DIAGNOSIS — Z7901 Long term (current) use of anticoagulants: Secondary | ICD-10-CM | POA: Diagnosis not present

## 2020-06-26 DIAGNOSIS — I272 Pulmonary hypertension, unspecified: Secondary | ICD-10-CM | POA: Diagnosis not present

## 2020-06-26 DIAGNOSIS — K5090 Crohn's disease, unspecified, without complications: Secondary | ICD-10-CM | POA: Diagnosis not present

## 2020-06-26 DIAGNOSIS — G4701 Insomnia due to medical condition: Secondary | ICD-10-CM | POA: Diagnosis not present

## 2020-06-26 DIAGNOSIS — E876 Hypokalemia: Secondary | ICD-10-CM | POA: Diagnosis not present

## 2020-06-26 DIAGNOSIS — I10 Essential (primary) hypertension: Secondary | ICD-10-CM | POA: Diagnosis not present

## 2020-06-26 DIAGNOSIS — F0391 Unspecified dementia with behavioral disturbance: Secondary | ICD-10-CM | POA: Diagnosis not present

## 2020-06-26 DIAGNOSIS — D649 Anemia, unspecified: Secondary | ICD-10-CM | POA: Diagnosis not present

## 2020-06-26 DIAGNOSIS — I4891 Unspecified atrial fibrillation: Secondary | ICD-10-CM | POA: Diagnosis not present

## 2020-06-26 DIAGNOSIS — K219 Gastro-esophageal reflux disease without esophagitis: Secondary | ICD-10-CM | POA: Diagnosis not present

## 2020-06-26 DIAGNOSIS — M150 Primary generalized (osteo)arthritis: Secondary | ICD-10-CM | POA: Diagnosis not present

## 2020-06-26 DIAGNOSIS — F331 Major depressive disorder, recurrent, moderate: Secondary | ICD-10-CM | POA: Diagnosis not present

## 2020-06-26 DIAGNOSIS — G4733 Obstructive sleep apnea (adult) (pediatric): Secondary | ICD-10-CM | POA: Diagnosis not present

## 2020-06-26 DIAGNOSIS — I480 Paroxysmal atrial fibrillation: Secondary | ICD-10-CM | POA: Diagnosis not present

## 2020-07-06 DIAGNOSIS — K219 Gastro-esophageal reflux disease without esophagitis: Secondary | ICD-10-CM | POA: Diagnosis not present

## 2020-07-06 DIAGNOSIS — E559 Vitamin D deficiency, unspecified: Secondary | ICD-10-CM | POA: Diagnosis not present

## 2020-07-06 DIAGNOSIS — I480 Paroxysmal atrial fibrillation: Secondary | ICD-10-CM | POA: Diagnosis not present

## 2020-07-06 DIAGNOSIS — F0391 Unspecified dementia with behavioral disturbance: Secondary | ICD-10-CM | POA: Diagnosis not present

## 2020-07-09 DIAGNOSIS — Z79899 Other long term (current) drug therapy: Secondary | ICD-10-CM | POA: Diagnosis not present

## 2020-07-14 DIAGNOSIS — Z6824 Body mass index (BMI) 24.0-24.9, adult: Secondary | ICD-10-CM | POA: Diagnosis not present

## 2020-07-14 DIAGNOSIS — Z7901 Long term (current) use of anticoagulants: Secondary | ICD-10-CM | POA: Diagnosis not present

## 2020-07-14 DIAGNOSIS — F331 Major depressive disorder, recurrent, moderate: Secondary | ICD-10-CM | POA: Diagnosis not present

## 2020-07-14 DIAGNOSIS — F411 Generalized anxiety disorder: Secondary | ICD-10-CM | POA: Diagnosis not present

## 2020-07-14 DIAGNOSIS — I480 Paroxysmal atrial fibrillation: Secondary | ICD-10-CM | POA: Diagnosis not present

## 2020-07-14 DIAGNOSIS — I4891 Unspecified atrial fibrillation: Secondary | ICD-10-CM | POA: Diagnosis not present

## 2020-07-14 DIAGNOSIS — G4700 Insomnia, unspecified: Secondary | ICD-10-CM | POA: Diagnosis not present

## 2020-07-14 DIAGNOSIS — I10 Essential (primary) hypertension: Secondary | ICD-10-CM | POA: Diagnosis not present

## 2020-07-14 DIAGNOSIS — G4733 Obstructive sleep apnea (adult) (pediatric): Secondary | ICD-10-CM | POA: Diagnosis not present

## 2020-07-14 DIAGNOSIS — E876 Hypokalemia: Secondary | ICD-10-CM | POA: Diagnosis not present

## 2020-07-14 DIAGNOSIS — D649 Anemia, unspecified: Secondary | ICD-10-CM | POA: Diagnosis not present

## 2020-07-14 DIAGNOSIS — K5090 Crohn's disease, unspecified, without complications: Secondary | ICD-10-CM | POA: Diagnosis not present

## 2020-07-14 DIAGNOSIS — I272 Pulmonary hypertension, unspecified: Secondary | ICD-10-CM | POA: Diagnosis not present

## 2020-07-14 DIAGNOSIS — E538 Deficiency of other specified B group vitamins: Secondary | ICD-10-CM | POA: Diagnosis not present

## 2020-07-14 DIAGNOSIS — E559 Vitamin D deficiency, unspecified: Secondary | ICD-10-CM | POA: Diagnosis not present

## 2020-07-14 DIAGNOSIS — K219 Gastro-esophageal reflux disease without esophagitis: Secondary | ICD-10-CM | POA: Diagnosis not present

## 2020-07-14 DIAGNOSIS — M150 Primary generalized (osteo)arthritis: Secondary | ICD-10-CM | POA: Diagnosis not present

## 2020-07-21 DIAGNOSIS — R296 Repeated falls: Secondary | ICD-10-CM | POA: Diagnosis not present

## 2020-07-21 DIAGNOSIS — M6281 Muscle weakness (generalized): Secondary | ICD-10-CM | POA: Diagnosis not present

## 2020-07-24 DIAGNOSIS — F331 Major depressive disorder, recurrent, moderate: Secondary | ICD-10-CM | POA: Diagnosis not present

## 2020-07-24 DIAGNOSIS — G4701 Insomnia due to medical condition: Secondary | ICD-10-CM | POA: Diagnosis not present

## 2020-07-24 DIAGNOSIS — F0391 Unspecified dementia with behavioral disturbance: Secondary | ICD-10-CM | POA: Diagnosis not present

## 2020-07-24 DIAGNOSIS — F064 Anxiety disorder due to known physiological condition: Secondary | ICD-10-CM | POA: Diagnosis not present

## 2020-07-31 ENCOUNTER — Encounter (HOSPITAL_COMMUNITY): Payer: Self-pay

## 2020-07-31 ENCOUNTER — Inpatient Hospital Stay (HOSPITAL_COMMUNITY)
Admission: EM | Admit: 2020-07-31 | Discharge: 2020-08-05 | DRG: 689 | Disposition: A | Discharge status: Skilled Nursing Facility | Payer: PPO | Source: Skilled Nursing Facility | Attending: Internal Medicine | Admitting: Internal Medicine

## 2020-07-31 ENCOUNTER — Other Ambulatory Visit: Payer: Self-pay

## 2020-07-31 DIAGNOSIS — Z7901 Long term (current) use of anticoagulants: Secondary | ICD-10-CM | POA: Diagnosis not present

## 2020-07-31 DIAGNOSIS — Z8249 Family history of ischemic heart disease and other diseases of the circulatory system: Secondary | ICD-10-CM | POA: Diagnosis not present

## 2020-07-31 DIAGNOSIS — F0390 Unspecified dementia without behavioral disturbance: Secondary | ICD-10-CM | POA: Diagnosis present

## 2020-07-31 DIAGNOSIS — E669 Obesity, unspecified: Secondary | ICD-10-CM | POA: Diagnosis present

## 2020-07-31 DIAGNOSIS — N1832 Chronic kidney disease, stage 3b: Secondary | ICD-10-CM | POA: Diagnosis present

## 2020-07-31 DIAGNOSIS — Z823 Family history of stroke: Secondary | ICD-10-CM | POA: Diagnosis not present

## 2020-07-31 DIAGNOSIS — Z79899 Other long term (current) drug therapy: Secondary | ICD-10-CM

## 2020-07-31 DIAGNOSIS — K5090 Crohn's disease, unspecified, without complications: Secondary | ICD-10-CM | POA: Diagnosis present

## 2020-07-31 DIAGNOSIS — Z83438 Family history of other disorder of lipoprotein metabolism and other lipidemia: Secondary | ICD-10-CM | POA: Diagnosis not present

## 2020-07-31 DIAGNOSIS — I959 Hypotension, unspecified: Secondary | ICD-10-CM | POA: Diagnosis not present

## 2020-07-31 DIAGNOSIS — N3000 Acute cystitis without hematuria: Secondary | ICD-10-CM | POA: Diagnosis not present

## 2020-07-31 DIAGNOSIS — I480 Paroxysmal atrial fibrillation: Secondary | ICD-10-CM | POA: Diagnosis not present

## 2020-07-31 DIAGNOSIS — N390 Urinary tract infection, site not specified: Secondary | ICD-10-CM | POA: Diagnosis not present

## 2020-07-31 DIAGNOSIS — Z6834 Body mass index (BMI) 34.0-34.9, adult: Secondary | ICD-10-CM

## 2020-07-31 DIAGNOSIS — E785 Hyperlipidemia, unspecified: Secondary | ICD-10-CM | POA: Diagnosis not present

## 2020-07-31 DIAGNOSIS — Z833 Family history of diabetes mellitus: Secondary | ICD-10-CM

## 2020-07-31 DIAGNOSIS — R001 Bradycardia, unspecified: Secondary | ICD-10-CM | POA: Diagnosis present

## 2020-07-31 DIAGNOSIS — Z880 Allergy status to penicillin: Secondary | ICD-10-CM

## 2020-07-31 DIAGNOSIS — Z7401 Bed confinement status: Secondary | ICD-10-CM | POA: Diagnosis not present

## 2020-07-31 DIAGNOSIS — R0902 Hypoxemia: Secondary | ICD-10-CM | POA: Diagnosis not present

## 2020-07-31 DIAGNOSIS — R739 Hyperglycemia, unspecified: Secondary | ICD-10-CM | POA: Diagnosis present

## 2020-07-31 DIAGNOSIS — Z20822 Contact with and (suspected) exposure to covid-19: Secondary | ICD-10-CM | POA: Diagnosis not present

## 2020-07-31 DIAGNOSIS — F32A Depression, unspecified: Secondary | ICD-10-CM | POA: Diagnosis present

## 2020-07-31 DIAGNOSIS — I4891 Unspecified atrial fibrillation: Secondary | ICD-10-CM | POA: Diagnosis not present

## 2020-07-31 DIAGNOSIS — R5381 Other malaise: Secondary | ICD-10-CM | POA: Diagnosis present

## 2020-07-31 DIAGNOSIS — I129 Hypertensive chronic kidney disease with stage 1 through stage 4 chronic kidney disease, or unspecified chronic kidney disease: Secondary | ICD-10-CM | POA: Diagnosis present

## 2020-07-31 DIAGNOSIS — Z9049 Acquired absence of other specified parts of digestive tract: Secondary | ICD-10-CM

## 2020-07-31 DIAGNOSIS — R4189 Other symptoms and signs involving cognitive functions and awareness: Secondary | ICD-10-CM | POA: Diagnosis present

## 2020-07-31 DIAGNOSIS — R531 Weakness: Secondary | ICD-10-CM | POA: Diagnosis not present

## 2020-07-31 DIAGNOSIS — R609 Edema, unspecified: Secondary | ICD-10-CM | POA: Diagnosis not present

## 2020-07-31 DIAGNOSIS — K219 Gastro-esophageal reflux disease without esophagitis: Secondary | ICD-10-CM | POA: Diagnosis present

## 2020-07-31 DIAGNOSIS — G9341 Metabolic encephalopathy: Secondary | ICD-10-CM | POA: Diagnosis present

## 2020-07-31 LAB — CBC WITH DIFFERENTIAL/PLATELET
Abs Immature Granulocytes: 0.09 10*3/uL — ABNORMAL HIGH (ref 0.00–0.07)
Basophils Absolute: 0.1 10*3/uL (ref 0.0–0.1)
Basophils Relative: 0 %
Eosinophils Absolute: 0.1 10*3/uL (ref 0.0–0.5)
Eosinophils Relative: 0 %
HCT: 31.9 % — ABNORMAL LOW (ref 36.0–46.0)
Hemoglobin: 9.8 g/dL — ABNORMAL LOW (ref 12.0–15.0)
Immature Granulocytes: 1 %
Lymphocytes Relative: 10 %
Lymphs Abs: 1.2 10*3/uL (ref 0.7–4.0)
MCH: 26.8 pg (ref 26.0–34.0)
MCHC: 30.7 g/dL (ref 30.0–36.0)
MCV: 87.4 fL (ref 80.0–100.0)
Monocytes Absolute: 1.2 10*3/uL — ABNORMAL HIGH (ref 0.1–1.0)
Monocytes Relative: 10 %
Neutro Abs: 9.4 10*3/uL — ABNORMAL HIGH (ref 1.7–7.7)
Neutrophils Relative %: 79 %
Platelets: 269 10*3/uL (ref 150–400)
RBC: 3.65 MIL/uL — ABNORMAL LOW (ref 3.87–5.11)
RDW: 15.5 % (ref 11.5–15.5)
WBC: 12 10*3/uL — ABNORMAL HIGH (ref 4.0–10.5)
nRBC: 0 % (ref 0.0–0.2)

## 2020-07-31 LAB — URINALYSIS, ROUTINE W REFLEX MICROSCOPIC
Bilirubin Urine: NEGATIVE
Glucose, UA: NEGATIVE mg/dL
Ketones, ur: NEGATIVE mg/dL
Nitrite: NEGATIVE
Protein, ur: 100 mg/dL — AB
Specific Gravity, Urine: 1.025 (ref 1.005–1.030)
pH: 5 (ref 5.0–8.0)

## 2020-07-31 LAB — BASIC METABOLIC PANEL
Anion gap: 10 (ref 5–15)
BUN: 26 mg/dL — ABNORMAL HIGH (ref 8–23)
CO2: 25 mmol/L (ref 22–32)
Calcium: 8.4 mg/dL — ABNORMAL LOW (ref 8.9–10.3)
Chloride: 100 mmol/L (ref 98–111)
Creatinine, Ser: 1.21 mg/dL — ABNORMAL HIGH (ref 0.44–1.00)
GFR, Estimated: 46 mL/min — ABNORMAL LOW (ref >60)
Glucose, Bld: 209 mg/dL — ABNORMAL HIGH (ref 70–99)
Potassium: 3.8 mmol/L (ref 3.5–5.1)
Sodium: 135 mmol/L (ref 135–145)

## 2020-07-31 LAB — BLOOD GAS, VENOUS
Acid-Base Excess: 1.2 mmol/L (ref 0.0–2.0)
Bicarbonate: 25.5 mmol/L (ref 20.0–28.0)
O2 Saturation: 13.6 %
Patient temperature: 98.6
pCO2, Ven: 41.4 mmHg — ABNORMAL LOW (ref 44.0–60.0)
pH, Ven: 7.406 (ref 7.250–7.430)
pO2, Ven: <31 mmHg — CL (ref 32.0–45.0)

## 2020-07-31 LAB — MAGNESIUM: Magnesium: 1.8 mg/dL (ref 1.7–2.4)

## 2020-07-31 NOTE — ED Triage Notes (Signed)
Pt BIB [facility] from the Memory Care Unit at [facility]. Per EMS, staff is concerned for a UTI. Pt has been more somnolent that normal and urinary frequency and foul odor. Pt has no complaints at this time. Pt is A&O to self.

## 2020-07-31 NOTE — ED Provider Notes (Signed)
Early DEPT Provider Note   CSN: [id number] Arrival date & time: 07/31/20  1440     History Chief Complaint  Patient presents with   Urinary Frequency    Kristen Gray is a 75 y.o. female.  HPI  75 year old female with past medical history of cognitive delay, Crohn's disease, HTN, HLD, chronic anemia, atrial fibrillation anticoagulated on Eliquis presents emergency department from [facility] with concern for increased sleepiness and foul odor to urine.  Patient is only oriented to herself, she is pleasant but has no complaint.  There is been no report of recent fever or other acute illness, no family or staff at bedside.  Past Medical History:  Diagnosis Date   Anemia    Anxiety    Anxiety disorder    Anxiety disorder, unspecified    Arthritis    Atrial fibrillation (HCC)    Chronic fatigue    Chronic fatigue, unspecified    Cognitive communication deficit    Crohn's disease ([place])    Crohn's disease (regional enteritis) ([place])    Depression    Essential hypertension    Gastroesophageal reflux disease without esophagitis    GERD (gastroesophageal reflux disease)    Heart murmur    Hypertension    Insomnia    Insomnia, unspecified    Major depression    Major depressive disorder    Muscle weakness (generalized)    Polyosteoarthritis    Reflux    Repeated falls    Vitamin B12 deficiency (dietary) anemia    Vitamin B12 deficiency anemia, unspecified    Vitamin D deficiency    Vitamin D deficiency, unspecified     Patient Active Problem List   Diagnosis Date Noted   Atrial fibrillation with RVR ([place]) 03/05/2020   Atrial fibrillation ([place]) 03/05/2020   AKI (acute kidney injury) ([place])    Moderate episode of recurrent major depressive disorder ([place]) 09/16/2019   Crohn disease ([place]) 02/25/2019   Chronic fatigue 02/25/2019   Vitamin B 12 deficiency 02/25/2019   Unilateral primary osteoarthritis, left knee 03/27/2018   Hypokalemia  02/20/2018   GERD (gastroesophageal reflux disease) 07/28/2017   Unilateral primary osteoarthritis, right knee 01/31/2017   Sebaceous cyst 10/21/2016   Anemia 10/20/2016   Primary osteoarthritis of right hip 09/12/2016   Pulmonary hypertension ([place]) 07/26/2016   OSA (obstructive sleep apnea) 07/26/2016   Morbid obesity ([place]) 04/14/2016   Hypertension, essential 04/13/2016   Vitamin D deficiency 04/13/2016   Insomnia 04/13/2016   Generalized anxiety disorder 04/13/2016   Major depression 04/13/2016   Osteoarthritis, 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/13/2016   Dyspnea 10/07/2015    Past Surgical History:  Procedure Laterality Date   ANKLE SURGERY     CARDIOVERSION N/A 03/09/2020   Procedure: CARDIOVERSION;  Surgeon: Larey Dresser, MD;  Location: [place];  Service: Cardiovascular;  Laterality: N/A;   CARDIOVERSION N/A 03/05/2020   Procedure: CARDIOVERSION;  Surgeon: Larey Dresser, MD;  Location: [place];  Service: Cardiovascular;  Laterality: N/A;   CARPAL TUNNEL RELEASE     CHOLECYSTECTOMY     COLON SURGERY     RIGHT/LEFT HEART CATH AND CORONARY ANGIOGRAPHY N/A 09/15/2016   Procedure: RIGHT/LEFT HEART CATH AND CORONARY ANGIOGRAPHY;  Surgeon: Larey Dresser, MD;  Location: [place] [facility];  Service: Cardiovascular;  Laterality: N/A;   TEE WITHOUT CARDIOVERSION N/A 03/05/2020   Procedure: TRANSESOPHAGEAL ECHOCARDIOGRAM (TEE);  Surgeon: Larey Dresser, MD;  Location: [facility] ENDOSCOPY;  Service: Cardiovascular;  Laterality: N/A;     OB  History   No obstetric history on file.     Family History  Problem Relation Age of Onset   Heart disease Mother        RF   Stroke Father    Diabetes Father    Hyperlipidemia Father    Hypertension Father     Social History   Tobacco Use   Smoking status: Never   Smokeless tobacco: Never  Vaping Use   Vaping Use: Unknown  Substance Use Topics   Alcohol use: Not Currently   Drug use: Never    Home Medications Prior to Admission  medications   Medication Sig Start Date End Date Taking? Authorizing Provider  acetaminophen (TYLENOL) 500 MG tablet Take 1,000 mg by mouth every 6 (six) hours as needed (FOR PAIN.).    [provider]  albuterol (PROAIR HFA) 108 (90 Base) MCG/ACT inhaler Inhale 2 puffs into the lungs every 6 (six) hours as needed for wheezing or shortness of breath. 11/08/19   [place], Betty G, MD  albuterol (VENTOLIN HFA) 108 (90 Base) MCG/ACT inhaler Inhale 2 puffs into the lungs every 6 (six) hours as needed for wheezing. 03/02/20   [provider]  apixaban (ELIQUIS) 5 MG TABS tablet Take 1 tablet (5 mg total) by mouth 2 (two) times daily. 03/09/20   Clegg, Amy D, NP  balsalazide (COLAZAL) 750 MG capsule Take 2 capsules (1,500 mg total) by mouth 2 (two) times daily. 01/30/20   Zehr, Laban Emperor, PA-C  balsalazide (COLAZAL) 750 MG capsule Take 1,500 mg by mouth 2 (two) times daily. 03/02/20   [provider]  calcium carbonate (OSCAL) 1500 (600 Ca) MG TABS tablet Take 2 tablets by mouth daily. 02/21/20   [provider]  Calcium Carbonate-Vitamin D (CALTRATE 600+D PO) Take 2 tablets by mouth daily.    [provider]  Cholecalciferol (VITAMIN D3) 50 MCG (2000 UT) capsule TAKE 1 CAPSULE BY MOUTH EVERY DAY Patient taking differently: Take 2,000 Units by mouth daily. 01/20/20   [place], Betty G, MD  D3 HIGH POTENCY 50 MCG (2000 UT) CAPS Take 1 capsule by mouth daily. 02/21/20   [provider]  ELIQUIS 5 MG TABS tablet Take 5 mg by mouth 2 (two) times daily. 03/09/20   [provider]  esomeprazole (NEXIUM) 20 MG capsule Take 20 mg by mouth daily.    [provider]  flecainide (TAMBOCOR) 100 MG tablet Take 1 tablet (100 mg total) by mouth every 12 (twelve) hours. 03/09/20   Clegg, Amy D, NP  flecainide (TAMBOCOR) 100 MG tablet Take 100 mg by mouth every 12 (twelve) hours. 03/09/20   [provider]  metoprolol succinate (TOPROL-XL) 50 MG 24 hr tablet  Take 1 tablet (50 mg total) by mouth 2 (two) times daily. Take with or immediately following a meal. 03/09/20   Clegg, Amy D, NP  metoprolol succinate (TOPROL-XL) 50 MG 24 hr tablet Take 50 mg by mouth 2 (two) times daily. 03/09/20   [provider]  omeprazole (PRILOSEC) 20 MG capsule Take 1 capsule (20 mg total) by mouth daily before breakfast. 07/02/19   [place], Betty G, MD  traZODone (DESYREL) 100 MG tablet TAKE 1/2 TABLET BY MOUTH NIGHTLY AT BEDTIME Patient taking differently: Take 100 mg by mouth at bedtime. 02/04/20   [place], Betty G, MD  traZODone (DESYREL) 50 MG tablet Take 50 mg by mouth at bedtime.    [provider]  venlafaxine XR (EFFEXOR-XR) 150 MG 24 hr capsule Take 150  mg by mouth every morning. 03/12/20   [provider]  venlafaxine XR (EFFEXOR-XR) 75 MG 24 hr capsule Take 2 capsules (150 mg total) by mouth daily with breakfast. Patient taking differently: Take 75 mg by mouth daily with breakfast. 02/04/20   [place]ique, Betty G, MD  vitamin B-12 (CYANOCOBALAMIN) 1000 MCG tablet Take 1 tablet (1,000 mcg total) by mouth daily. 01/20/20   [place], Betty G, MD  vitamin B-12 (CYANOCOBALAMIN) 1000 MCG tablet Take 1,000 mcg by mouth daily. 02/21/20   [provider]    Allergies    Penicillins, Penicillin g, Penicillins, and Penicillins  Review of Systems   Review of Systems  Reason unable to perform ROS: cognitive delay.   Physical Exam Updated Vital Signs BP (!) 164/60   Pulse (!) 42   Temp 98 F (36.7 C) (Rectal)   Resp 18   SpO2 98%   Physical Exam Vitals and nursing note reviewed.  Constitutional:      Appearance: Normal appearance. She is not toxic-appearing.     Comments: Sleepy but easily arousable  HENT:     Head: Normocephalic.     Mouth/Throat:     Mouth: Mucous membranes are moist.  Eyes:     Pupils: Pupils are equal, round, and reactive to light.  Cardiovascular:     Rate and Rhythm: Bradycardia present.  Pulmonary:      Effort: Pulmonary effort is normal. No respiratory distress.  Abdominal:     General: There is no distension.     Palpations: Abdomen is soft.     Tenderness: There is no abdominal tenderness.  Musculoskeletal:        General: No deformity.     Cervical back: No rigidity.  Skin:    General: Skin is warm.  Neurological:     Mental Status: She is alert. Mental status is at baseline.  Psychiatric:        Mood and Affect: Mood normal.    ED Results / Procedures / Treatments   Labs (all labs ordered are listed, but only abnormal results are displayed) Labs Reviewed  CBC WITH DIFFERENTIAL/PLATELET  BASIC METABOLIC PANEL  URINALYSIS, ROUTINE W REFLEX MICROSCOPIC    EKG None  Radiology No results found.  Procedures Procedures   Medications Ordered in ED Medications - No data to display  ED Course  I have reviewed the triage vital signs and the nursing notes.  Pertinent labs & imaging results that were available during my care of the patient were reviewed by me and considered in my medical decision making (see chart for details).    MDM Rules/Calculators/A&P                          75 year old female with baseline cognitive delay presents emergency department concern for increased drowsiness and periodic confusion.  Patient is bradycardic on arrival with stable blood pressure.  She denies any complaints.  Work-up is significant for urinary tract infection.  Patient is ambulatory at baseline, we attempted to stand her up and she was too weak to even put it with two-person assist.  Patient will be admitted for weakness secondary to urinary tract infection.  Patient's bradycardia is new, her electrolytes are unremarkable.  With a stable blood pressure I do not feel this is contributing to her changed mental status given that she has an ongoing urinary tract infection.  Patients evaluation and results requires admission for further treatment and care. Patient agrees with  admission  plan, offers no new complaints and is stable/unchanged at time of admit.  Final Clinical Impression(s) / ED Diagnoses Final diagnoses:  None    Rx / DC Orders ED Discharge Orders     None        Lorelle Gibbs, DO 08/01/20 0006

## 2020-07-31 NOTE — ED Notes (Signed)
Pt speaking on phone with sister

## 2020-07-31 NOTE — ED Notes (Signed)
Patient attempted ambulation. Patient could not stand up completely from the bed even with assistance.

## 2020-08-01 DIAGNOSIS — N3000 Acute cystitis without hematuria: Secondary | ICD-10-CM

## 2020-08-01 LAB — RESP PANEL BY RT-PCR (FLU A&B, COVID) ARPGX2
Influenza A by PCR: NEGATIVE
Influenza B by PCR: NEGATIVE
SARS Coronavirus 2 by RT PCR: NEGATIVE

## 2020-08-01 MED ORDER — ONDANSETRON HCL 4 MG PO TABS: 4.0000 mg | ORAL_TABLET | Freq: Four times a day (QID) | ORAL | Status: DC | PRN

## 2020-08-01 MED ORDER — FLECAINIDE ACETATE 100 MG PO TABS
100.0000 mg | ORAL_TABLET | Freq: Two times a day (BID) | ORAL | Status: DC
  Administered 2020-08-01 – 2020-08-05 (×8): 100 mg via ORAL
  Filled 2020-08-01 (×9): qty 1

## 2020-08-01 MED ORDER — DIVALPROEX SODIUM 125 MG PO CSDR
125.0000 mg | DELAYED_RELEASE_CAPSULE | Freq: Two times a day (BID) | ORAL | Status: DC
  Administered 2020-08-01 – 2020-08-05 (×8): 125 mg via ORAL
  Filled 2020-08-01 (×9): qty 1

## 2020-08-01 MED ORDER — APIXABAN 5 MG PO TABS
5.0000 mg | ORAL_TABLET | Freq: Two times a day (BID) | ORAL | Status: DC
  Administered 2020-08-01 – 2020-08-05 (×8): 5 mg via ORAL
  Filled 2020-08-01 (×9): qty 1

## 2020-08-01 MED ORDER — BALSALAZIDE DISODIUM 750 MG PO CAPS
1500.0000 mg | ORAL_CAPSULE | Freq: Two times a day (BID) | ORAL | Status: DC
  Administered 2020-08-01 – 2020-08-05 (×8): 1500 mg via ORAL
  Filled 2020-08-01 (×9): qty 2

## 2020-08-01 MED ORDER — PANTOPRAZOLE SODIUM 40 MG PO TBEC
40.0000 mg | DELAYED_RELEASE_TABLET | Freq: Every day | ORAL | Status: DC
  Administered 2020-08-01 – 2020-08-05 (×5): 40 mg via ORAL
  Filled 2020-08-01 (×5): qty 1

## 2020-08-01 MED ORDER — VENLAFAXINE HCL ER 75 MG PO CP24
75.0000 mg | ORAL_CAPSULE | ORAL | Status: DC
  Administered 2020-08-02 – 2020-08-04 (×2): 75 mg via ORAL
  Filled 2020-08-01 (×4): qty 1

## 2020-08-01 MED ORDER — TRAZODONE HCL 50 MG PO TABS
50.0000 mg | ORAL_TABLET | Freq: Every day | ORAL | Status: DC
  Administered 2020-08-01 – 2020-08-04 (×4): 50 mg via ORAL
  Filled 2020-08-01 (×4): qty 1

## 2020-08-01 MED ORDER — ACETAMINOPHEN 325 MG PO TABS
650.0000 mg | ORAL_TABLET | Freq: Four times a day (QID) | ORAL | Status: DC | PRN
  Administered 2020-08-02: 650 mg via ORAL
  Filled 2020-08-01: qty 2

## 2020-08-01 MED ORDER — HYDRALAZINE HCL 20 MG/ML IJ SOLN
5.0000 mg | Freq: Four times a day (QID) | INTRAMUSCULAR | Status: DC | PRN
  Administered 2020-08-04: 5 mg via INTRAVENOUS
  Filled 2020-08-01: qty 1

## 2020-08-01 MED ORDER — VITAMIN B-12 1000 MCG PO TABS
1000.0000 ug | ORAL_TABLET | Freq: Every day | ORAL | Status: DC
  Administered 2020-08-01 – 2020-08-05 (×5): 1000 ug via ORAL
  Filled 2020-08-01 (×5): qty 1

## 2020-08-01 MED ORDER — SODIUM CHLORIDE 0.9 % IV SOLN
2.0000 g | INTRAVENOUS | Status: DC
  Administered 2020-08-02 – 2020-08-04 (×3): 2 g via INTRAVENOUS
  Filled 2020-08-01: qty 2
  Filled 2020-08-01 (×2): qty 20

## 2020-08-01 MED ORDER — ACETAMINOPHEN 650 MG RE SUPP: 650.0000 mg | Freq: Four times a day (QID) | RECTAL | Status: DC | PRN

## 2020-08-01 MED ORDER — SODIUM CHLORIDE 0.9 % IV SOLN: Freq: Once | INTRAVENOUS | Status: AC

## 2020-08-01 MED ORDER — LEVOFLOXACIN IN D5W 750 MG/150ML IV SOLN
750.0000 mg | Freq: Once | INTRAVENOUS | Status: AC
  Administered 2020-08-01: 750 mg via INTRAVENOUS

## 2020-08-01 MED ORDER — SERTRALINE HCL 50 MG PO TABS
50.0000 mg | ORAL_TABLET | Freq: Every day | ORAL | Status: DC
  Administered 2020-08-01 – 2020-08-05 (×5): 50 mg via ORAL
  Filled 2020-08-01 (×5): qty 1

## 2020-08-01 MED ORDER — LORAZEPAM 0.5 MG PO TABS
0.5000 mg | ORAL_TABLET | Freq: Two times a day (BID) | ORAL | Status: DC | PRN
  Administered 2020-08-01 – 2020-08-04 (×4): 0.5 mg via ORAL
  Filled 2020-08-01 (×5): qty 1

## 2020-08-01 MED ORDER — ONDANSETRON HCL 4 MG/2ML IJ SOLN: 4.0000 mg | Freq: Four times a day (QID) | INTRAMUSCULAR | Status: DC | PRN

## 2020-08-01 MED ORDER — ENSURE ENLIVE PO LIQD
237.0000 mL | Freq: Two times a day (BID) | ORAL | Status: DC
  Administered 2020-08-01 – 2020-08-05 (×7): 237 mL via ORAL

## 2020-08-01 NOTE — ED Notes (Signed)
Spoke with pt sister regarding pt status

## 2020-08-01 NOTE — NC FL2 (Signed)
[place] LEVEL OF CARE SCREENING TOOL     IDENTIFICATION  Patient Name: Kristen Gray Birthdate: [birth year] Sex: female Admission Date (Current Location): 07/31/2020  [facility] and [place] Number:  Herbalist and Address:  [facility],  [place], [place]      Provider Number: [id number]  Attending Physician Name and Address:  Jonnie Finner, DO  Relative Name and Phone Number:       Current Level of Care: Hospital Recommended Level of Care: [place] Prior Approval Number:    Date Approved/Denied:   PASRR Number: [id number]  Discharge Plan: Other (Comment) Nanine Means on Reserve)    Current Diagnoses: Patient Active Problem List   Diagnosis Date Noted   UTI (urinary tract infection) 07/31/2020   Atrial fibrillation with RVR ([place]) 03/05/2020   Atrial fibrillation ([place]) 03/05/2020   AKI (acute kidney injury) ([place])    Moderate episode of recurrent major depressive disorder ([place]) 09/16/2019   Crohn disease ([place]) 02/25/2019   Chronic fatigue 02/25/2019   Vitamin B 12 deficiency 02/25/2019   Unilateral primary osteoarthritis, left knee 03/27/2018   Hypokalemia 02/20/2018   GERD (gastroesophageal reflux disease) 07/28/2017   Unilateral primary osteoarthritis, right knee 01/31/2017   Sebaceous cyst 10/21/2016   Anemia 10/20/2016   Primary osteoarthritis of right hip 09/12/2016   Pulmonary hypertension ([place]) 07/26/2016   OSA (obstructive sleep apnea) 07/26/2016   Morbid obesity ([place]) 04/14/2016   Hypertension, essential 04/13/2016   Vitamin D deficiency 04/13/2016   Insomnia 04/13/2016   Generalized anxiety disorder 04/13/2016   Major depression 04/13/2016   Osteoarthritis, 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/13/2016   Dyspnea 10/07/2015    Orientation RESPIRATION BLADDER Height & Weight     Self  Normal Incontinent Weight:   Height:     BEHAVIORAL SYMPTOMS/MOOD NEUROLOGICAL BOWEL NUTRITION STATUS      Incontinent  Diet (Carb modified)  AMBULATORY STATUS COMMUNICATION OF NEEDS Skin   Extensive Assist Verbally Normal                       Personal Care Assistance Level of Assistance  Bathing, Feeding, Dressing Bathing Assistance: Maximum assistance Feeding assistance: Limited assistance Dressing Assistance: Maximum assistance     Functional Limitations Info  Sight, Hearing, Speech Sight Info: Adequate Hearing Info: Adequate Speech Info: Adequate    SPECIAL CARE FACTORS FREQUENCY  PT (By licensed PT), OT (By licensed OT)     PT Frequency: 5x weekly OT Frequency: 5x weekly            Contractures Contractures Info: Not present    Additional Factors Info  Allergies, Code Status Code Status Info: Full Allergies Info: Penicillin           Current Medications (08/01/2020):  This is the current hospital active medication list Current Facility-Administered Medications  Medication Dose Route Frequency Provider Last Rate Last Admin   acetaminophen (TYLENOL) tablet 650 mg  650 mg Oral Q6H PRN Marylyn Ishihara, Tyrone A, DO       Or   acetaminophen (TYLENOL) suppository 650 mg  650 mg Rectal Q6H PRN Marylyn Ishihara, Tyrone A, DO       [START ON 08/02/2020] cefTRIAXone (ROCEPHIN) 2 g in sodium chloride 0.9 % 100 mL IVPB  2 g Intravenous Q24H Kyle, Tyrone A, DO       ondansetron (ZOFRAN) tablet 4 mg  4 mg Oral Q6H PRN Marylyn Ishihara, Tyrone A, DO       Or  ondansetron (ZOFRAN) injection 4 mg  4 mg Intravenous Q6H PRN Cherylann Ratel A, DO       Current Outpatient Medications  Medication Sig Dispense Refill   acetaminophen (TYLENOL) 500 MG tablet Take 1,000 mg by mouth every 6 (six) hours as needed for mild pain.     apixaban (ELIQUIS) 5 MG TABS tablet Take 1 tablet (5 mg total) by mouth 2 (two) times daily. 60 tablet 6   balsalazide (COLAZAL) 750 MG capsule Take 2 capsules (1,500 mg total) by mouth 2 (two) times daily. 120 capsule 11   Calcium Carb-Cholecalciferol (CALCIUM-VITAMIN D3) 600-200 MG-UNIT TABS Take 2  tablets by mouth daily.     Cholecalciferol (VITAMIN D3) 50 MCG (2000 UT) capsule TAKE 1 CAPSULE BY MOUTH EVERY DAY (Patient taking differently: Take 2,000 Units by mouth daily.) 30 capsule 3   esomeprazole (NEXIUM) 20 MG capsule Take 20 mg by mouth daily.     flecainide (TAMBOCOR) 100 MG tablet Take 1 tablet (100 mg total) by mouth every 12 (twelve) hours. 60 tablet 6   LORazepam (ATIVAN) 0.5 MG tablet Take 0.5 mg by mouth every 12 (twelve) hours as needed for anxiety.     Metoprolol Succinate 50 MG CS24 Take 50 mg by mouth daily. Metoprolol Succinate Capsule ER 24 hour sprinkle 36m     sertraline (ZOLOFT) 50 MG tablet Take 50 mg by mouth daily.     traZODone (DESYREL) 50 MG tablet Take 50 mg by mouth at bedtime.     venlafaxine XR (EFFEXOR-XR) 75 MG 24 hr capsule Take 2 capsules (150 mg total) by mouth daily with breakfast. (Patient taking differently: Take 75 mg by mouth every other day.) 60 capsule 3   vitamin B-12 (CYANOCOBALAMIN) 1000 MCG tablet Take 1 tablet (1,000 mcg total) by mouth daily. 30 tablet 3   metoprolol succinate (TOPROL-XL) 50 MG 24 hr tablet Take 1 tablet (50 mg total) by mouth 2 (two) times daily. Take with or immediately following a meal. (Patient not taking: Reported on 08/01/2020) 60 tablet 6   traZODone (DESYREL) 100 MG tablet TAKE 1/2 TABLET BY MOUTH NIGHTLY AT BEDTIME (Patient not taking: Reported on 08/01/2020) 45 tablet 1     Discharge Medications: Please see discharge summary for a list of discharge medications.  Relevant Imaging Results:  Relevant Lab Results:   Additional Information SSN# [id number]/  J&J Vaccine 06/11/2019  CArchie Endo LCSW

## 2020-08-01 NOTE — Social Work (Signed)
TOC CM/SW consulted with attending regarding   [employer]. HTA would like to approve SNF for this patient if we worked her up from the ED.  PT evaluation has been ordered.

## 2020-08-01 NOTE — TOC Initial Note (Signed)
Transition of Care [facility]) - Initial/Assessment Note    Patient Details  Name: Kristen Gray MRN: [id number] Date of Birth: 1945/06/24  [facility] CM/SW Contact:    Archie Endo, LCSW Phone Number: 08/01/2020, 1:13 PM  Clinical Narrative:                 CSW spoke with patient's sister Ursula Beath confirms patient resides at [facility] on [address] in the memory care unit. Stanton Kidney is agreeable for patient to go to a facility for short term rehab and wants her to return to [facility] after rehab. Stanton Kidney is adamant that her sister not be sent to [facility].  CSW completed FL2 and faxed patient's clinical information out for review.  Expected Discharge Plan: Skilled Nursing Facility Barriers to Discharge: Continued Medical Work up, Ship broker   Patient Goals and CMS Choice Patient states their goals for this hospitalization and ongoing recovery are:: CSW spoke to the patient's sister who would like for this patient to return to [facility] after STR. CMS Medicare.gov Compare Post Acute Care list provided to:: Patient Represenative (must comment) (Sister is legal guardian) Choice offered to / list presented to : [facility] POA / Guardian  Expected Discharge Plan and Services Expected Discharge Plan: [place] In-house Referral: Clinical Social Work     Living arrangements for the past 2 months: [place]                                      Prior Living Arrangements/Services Living arrangements for the past 2 months: [place] Lives with:: Facility Resident   Do you feel safe going back to the place where you live?: Yes      Need for Family Participation in Patient Care: No (Comment) Care giver support system in place?: Yes (comment)   Criminal Activity/Legal Involvement Pertinent to Current Situation/Hospitalization: No - Comment as needed  Activities of Daily Living      Permission  Sought/Granted Permission sought to share information with : Case Manager, Customer service manager, Family Supports Permission granted to share information with : Yes, Verbal Permission Granted  Share Information with NAME: Sister           Emotional Assessment Appearance:: Appears stated age Attitude/Demeanor/Rapport: Engaged Affect (typically observed): Accepting Orientation: : Oriented to Self Alcohol / Substance Use: Not Applicable Psych Involvement: No (comment)  Admission diagnosis:  UTI (urinary tract infection) [N39.0] Patient Active Problem List   Diagnosis Date Noted   UTI (urinary tract infection) 07/31/2020   Atrial fibrillation with RVR ([place]) 03/05/2020   Atrial fibrillation ([place]) 03/05/2020   AKI (acute kidney injury) ([place])    Moderate episode of recurrent major depressive disorder ([place]) 09/16/2019   Crohn disease ([place]) 02/25/2019   Chronic fatigue 02/25/2019   Vitamin B 12 deficiency 02/25/2019   Unilateral primary osteoarthritis, left knee 03/27/2018   Hypokalemia 02/20/2018   GERD (gastroesophageal reflux disease) 07/28/2017   Unilateral primary osteoarthritis, right knee 01/31/2017   Sebaceous cyst 10/21/2016   Anemia 10/20/2016   Primary osteoarthritis of right hip 09/12/2016   Pulmonary hypertension ([place]) 07/26/2016   OSA (obstructive sleep apnea) 07/26/2016   Morbid obesity ([place]) 04/14/2016   Hypertension, essential 04/13/2016   Vitamin D deficiency 04/13/2016   Insomnia 04/13/2016   Generalized anxiety disorder 04/13/2016   Major depression 04/13/2016   Osteoarthritis, 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/13/2016  Dyspnea 10/07/2015   PCP:  Garwin Brothers, MD Pharmacy:   [facility] [place], [place] - [address] [place] [address] Phone: [phone] Fax: [phone]     Social Determinants of Health (SDOH) Interventions    Readmission Risk Interventions No flowsheet data found.

## 2020-08-01 NOTE — ED Notes (Signed)
Pt resting in bed quietly at present

## 2020-08-01 NOTE — ED Notes (Signed)
Pt continues to take monitoring equipment off.

## 2020-08-01 NOTE — Progress Notes (Signed)
Patients blood pressure was elevated at 184/101 using automatic blood pressure cuff. Spoke with Irine Seal, MD. Repeated blood pressure manually and reading was 152/70. Dr. Grandville Silos reviewing home medications for blood pressure. Will continue to monitor.

## 2020-08-01 NOTE — Progress Notes (Signed)
Pt. Sister Stanton Kidney was notified of the patient  admission. Sister could be reached at her home phone number listed.

## 2020-08-01 NOTE — H&P (Signed)
History and Physical    Kristen Gray MKL:[id number] DOB: 09/25/1945 DOA: 07/31/2020  PCP: Garwin Brothers, MD  Patient coming from: Memory Care at [facility]  Chief Complaint: Urinary frequency  HPI: Kristen Gray is a 75 y.o. female with medical history significant of Crohn's, HTN, HLD, a fib on eliquis. Presenting with urinary frequency. Patient has a cognitive impairment. History is from chart review. She was brought from her facility d/t increased lethargy and malodorous urine. There was concern that she may have a UTI.   ED Course: UA was dirty. Ucx sent. She was started on levaquin. TRH was called for admission.   Review of Systems:  Unable to obtain d/t mentation  PMHx Past Medical History:  Diagnosis Date   Anemia    Anxiety    Anxiety disorder    Anxiety disorder, unspecified    Arthritis    Atrial fibrillation (HCC)    Chronic fatigue    Chronic fatigue, unspecified    Cognitive communication deficit    Crohn's disease ([place])    Crohn's disease (regional enteritis) ([place])    Depression    Essential hypertension    Gastroesophageal reflux disease without esophagitis    GERD (gastroesophageal reflux disease)    Heart murmur    Hypertension    Insomnia    Insomnia, unspecified    Major depression    Major depressive disorder    Muscle weakness (generalized)    Polyosteoarthritis    Reflux    Repeated falls    Vitamin B12 deficiency (dietary) anemia    Vitamin B12 deficiency anemia, unspecified    Vitamin D deficiency    Vitamin D deficiency, unspecified     PSHx Past Surgical History:  Procedure Laterality Date   ANKLE SURGERY     CARDIOVERSION N/A 03/09/2020   Procedure: CARDIOVERSION;  Surgeon: Larey Dresser, MD;  Location: [facility];  Service: Cardiovascular;  Laterality: N/A;   CARDIOVERSION N/A 03/05/2020   Procedure: CARDIOVERSION;  Surgeon: Larey Dresser, MD;  Location: [facility];  Service: Cardiovascular;  Laterality: N/A;   CARPAL  TUNNEL RELEASE     CHOLECYSTECTOMY     COLON SURGERY     RIGHT/LEFT HEART CATH AND CORONARY ANGIOGRAPHY N/A 09/15/2016   Procedure: RIGHT/LEFT HEART CATH AND CORONARY ANGIOGRAPHY;  Surgeon: Larey Dresser, MD;  Location: [facility];  Service: Cardiovascular;  Laterality: N/A;   TEE WITHOUT CARDIOVERSION N/A 03/05/2020   Procedure: TRANSESOPHAGEAL ECHOCARDIOGRAM (TEE);  Surgeon: Larey Dresser, MD;  Location: [facility];  Service: Cardiovascular;  Laterality: N/A;    SocHx  reports that she has never smoked. She has never used smokeless tobacco. She reports previous alcohol use. She reports that she does not use drugs.  Allergies  Allergen Reactions   Penicillin G Nausea Only    Has patient had a PCN reaction causing immediate rash, facial/tongue/throat swelling, SOB or lightheadedness with hypotension:No Has patient had a PCN reaction causing severe rash involving mucus membranes or skin necrosis: No Has patient had a PCN reaction that required hospitalization: No Has patient had a PCN reaction occurring within the last 10 years: No--NAUSEA ONLY If all of the above answers are "NO", then may proceed with Cephalosporin use.     FamHx Family History  Problem Relation Age of Onset   Heart disease Mother        RF   Stroke Father    Diabetes Father    Hyperlipidemia Father    Hypertension Father  Prior to Admission medications   Medication Sig Start Date End Date Taking? Authorizing Provider  acetaminophen (TYLENOL) 500 MG tablet Take 1,000 mg by mouth every 6 (six) hours as needed (FOR PAIN.).   Yes [provider]  albuterol (PROAIR HFA) 108 (90 Base) MCG/ACT inhaler Inhale 2 puffs into the lungs every 6 (six) hours as needed for wheezing or shortness of breath. 11/08/19  Yes [place], Betty G, MD  apixaban (ELIQUIS) 5 MG TABS tablet Take 1 tablet (5 mg total) by mouth 2 (two) times daily. 03/09/20  Yes Clegg, Amy D, NP  balsalazide (COLAZAL) 750 MG capsule  Take 2 capsules (1,500 mg total) by mouth 2 (two) times daily. 01/30/20  Yes Zehr, Laban Emperor, PA-C  Calcium Carbonate-Vitamin D (CALTRATE 600+D PO) Take 2 tablets by mouth daily.   Yes [provider]  Cholecalciferol (VITAMIN D3) 50 MCG (2000 UT) capsule TAKE 1 CAPSULE BY MOUTH EVERY DAY Patient taking differently: Take 2,000 Units by mouth daily. 01/20/20  Yes [place], Betty G, MD  ELIQUIS 5 MG TABS tablet Take 5 mg by mouth 2 (two) times daily. 03/09/20  Yes [provider]  esomeprazole (NEXIUM) 20 MG capsule Take 20 mg by mouth daily.   Yes [provider]  flecainide (TAMBOCOR) 100 MG tablet Take 1 tablet (100 mg total) by mouth every 12 (twelve) hours. 03/09/20  Yes Clegg, Amy D, NP  metoprolol succinate (TOPROL-XL) 50 MG 24 hr tablet Take 1 tablet (50 mg total) by mouth 2 (two) times daily. Take with or immediately following a meal. Patient taking differently: Take 50 mg by mouth daily. Take with or immediately following a meal. 03/09/20  Yes Clegg, Amy D, NP  traZODone (DESYREL) 50 MG tablet Take 50 mg by mouth at bedtime.   Yes [provider]  venlafaxine XR (EFFEXOR-XR) 75 MG 24 hr capsule Take 2 capsules (150 mg total) by mouth daily with breakfast. Patient taking differently: Take 75 mg by mouth daily with breakfast. 02/04/20  Yes [place], Betty G, MD  vitamin B-12 (CYANOCOBALAMIN) 1000 MCG tablet Take 1 tablet (1,000 mcg total) by mouth daily. 01/20/20  Yes [place], Betty G, MD  DEPAKOTE SPRINKLES 125 MG capsule Take 125 mg by mouth daily. 06/26/20   [provider]  omeprazole (PRILOSEC) 20 MG capsule Take 1 capsule (20 mg total) by mouth daily before breakfast. 07/02/19   [place], Betty G, MD  sertraline (ZOLOFT) 50 MG tablet Take 50 mg by mouth daily. 05/31/20   [provider]  traZODone (DESYREL) 100 MG tablet TAKE 1/2 TABLET BY MOUTH NIGHTLY AT BEDTIME Patient taking differently: Take 100 mg by mouth at bedtime. 02/04/20   [place], Betty G, MD     Physical Exam: Vitals:   08/01/20 0145 08/01/20 0448 08/01/20 0600 08/01/20 0700  BP: 121/60 (!) 143/57 (!) 129/57 (!) 147/59  Pulse: (!) 49 (!) 49 (!) 48   Resp: 17 18 17    Temp:      TempSrc:      SpO2: 97% 98% 97%     General: 75 y.o. female resting in bed in NAD Eyes: PERRL, normal sclera ENMT: Nares patent w/o discharge, orophaynx clear, dentition normal, ears w/o discharge/lesions/ulcers Neck: Supple, trachea midline Cardiovascular: brady, +S1, S2, no m/g/r, equal pulses throughout Respiratory: CTABL, no w/r/r, normal WOB GI: BS+, NDNT, no masses noted, no organomegaly noted MSK: No e/c/c Skin: No rashes, bruises, ulcerations noted Neuro: A&O x name only, no focal deficits but only follows limited commands  Psyc: calm/pleasant  Labs on Admission: I have personally reviewed following labs and imaging studies  CBC: Recent Labs  Lab 07/31/20 1620  WBC 12.0*  NEUTROABS 9.4*  HGB 9.8*  HCT 31.9*  MCV 87.4  PLT 488   Basic Metabolic Panel: Recent Labs  Lab 07/31/20 1620  NA 135  K 3.8  CL 100  CO2 25  GLUCOSE 209*  BUN 26*  CREATININE 1.21*  CALCIUM 8.4*  MG 1.8   GFR: CrCl cannot be calculated (Unknown ideal weight.). Liver Function Tests: No results for input(s): AST, ALT, ALKPHOS, BILITOT, PROT, ALBUMIN in the last 168 hours. No results for input(s): LIPASE, AMYLASE in the last 168 hours. No results for input(s): AMMONIA in the last 168 hours. Coagulation Profile: No results for input(s): INR, PROTIME in the last 168 hours. Cardiac Enzymes: No results for input(s): CKTOTAL, CKMB, CKMBINDEX, TROPONINI in the last 168 hours. BNP (last 3 results) No results for input(s): PROBNP in the last 8760 hours. HbA1C: No results for input(s): HGBA1C in the last 72 hours. CBG: No results for input(s): GLUCAP in the last 168 hours. Lipid Profile: No results for input(s): CHOL, HDL, LDLCALC, TRIG, CHOLHDL, LDLDIRECT in the last 72 hours. Thyroid Function  Tests: No results for input(s): TSH, T4TOTAL, FREET4, T3FREE, THYROIDAB in the last 72 hours. Anemia Panel: No results for input(s): VITAMINB12, FOLATE, FERRITIN, TIBC, IRON, RETICCTPCT in the last 72 hours. Urine analysis:    Component Value Date/Time   COLORURINE AMBER (A) 07/31/2020 1754   APPEARANCEUR HAZY (A) 07/31/2020 1754   LABSPEC 1.025 07/31/2020 1754   PHURINE 5.0 07/31/2020 1754   GLUCOSEU NEGATIVE 07/31/2020 1754   HGBUR SMALL (A) 07/31/2020 1754   BILIRUBINUR NEGATIVE 07/31/2020 1754   KETONESUR NEGATIVE 07/31/2020 1754   PROTEINUR 100 (A) 07/31/2020 1754   NITRITE NEGATIVE 07/31/2020 1754   LEUKOCYTESUR MODERATE (A) 07/31/2020 1754    Radiological Exams on Admission: No results found.  EKG: None obtained in ED  Assessment/Plan UTI Acute on chronic metabolic encephalopathy     - admit to inpt, tele     - UA dirty, awaiting Ucx     - given 776m IV lvq this morning in ED; switch that to rocephin in the AM     - not septic     - treat infection and monitor mentation (unsure of what baseline is, but she does have dementia)  HTN     - resume home regimen when confirmed  A fib     - resume home regimen when confirmed  Crohn's disease     - resume home regimen when confirmed  Hyperglycemia     - check A1c  CKD3b     - at baseline, follow  Debility Weakness    - PT/OT eval  Bradycardia    - order EKG; follow  DVT prophylaxis: eliquis  Code Status: FULL  Family Communication: None at bedside  Consults called: None   Status is: Inpatient  Remains inpatient appropriate because:Inpatient level of care appropriate due to severity of illness  Dispo: The patient is from: SNF              Anticipated d/c is to: SNF              Patient currently is not medically stable to d/c.   Difficult to place patient No  Time spent coordinating admission: 30 minutes  [facility]  If 7PM-7AM, please contact  night-coverage [email or web address]  08/01/2020, 8:09 AM

## 2020-08-01 NOTE — Plan of Care (Signed)
  Problem: Safety: Goal: Ability to remain free from injury will improve Outcome: Progressing   Problem: Skin Integrity: Goal: Risk for impaired skin integrity will decrease Outcome: Progressing   Problem: Urinary Elimination: Goal: Signs and symptoms of infection will decrease Outcome: Progressing

## 2020-08-02 LAB — COMPREHENSIVE METABOLIC PANEL
ALT: 19 U/L (ref 0–44)
AST: 56 U/L — ABNORMAL HIGH (ref 15–41)
Albumin: 2.5 g/dL — ABNORMAL LOW (ref 3.5–5.0)
Alkaline Phosphatase: 144 U/L — ABNORMAL HIGH (ref 38–126)
Anion gap: 8 (ref 5–15)
BUN: 20 mg/dL (ref 8–23)
CO2: 29 mmol/L (ref 22–32)
Calcium: 8.4 mg/dL — ABNORMAL LOW (ref 8.9–10.3)
Chloride: 100 mmol/L (ref 98–111)
Creatinine, Ser: 1.02 mg/dL — ABNORMAL HIGH (ref 0.44–1.00)
GFR, Estimated: 57 mL/min — ABNORMAL LOW (ref >60)
Glucose, Bld: 116 mg/dL — ABNORMAL HIGH (ref 70–99)
Potassium: 3.6 mmol/L (ref 3.5–5.1)
Sodium: 137 mmol/L (ref 135–145)
Total Bilirubin: 0.9 mg/dL (ref 0.3–1.2)
Total Protein: 5.7 g/dL — ABNORMAL LOW (ref 6.5–8.1)

## 2020-08-02 LAB — CBC
HCT: 29.9 % — ABNORMAL LOW (ref 36.0–46.0)
Hemoglobin: 9.2 g/dL — ABNORMAL LOW (ref 12.0–15.0)
MCH: 26.8 pg (ref 26.0–34.0)
MCHC: 30.8 g/dL (ref 30.0–36.0)
MCV: 87.2 fL (ref 80.0–100.0)
Platelets: 218 10*3/uL (ref 150–400)
RBC: 3.43 MIL/uL — ABNORMAL LOW (ref 3.87–5.11)
RDW: 15.6 % — ABNORMAL HIGH (ref 11.5–15.5)
WBC: 10.5 10*3/uL (ref 4.0–10.5)
nRBC: 0 % (ref 0.0–0.2)

## 2020-08-02 NOTE — Evaluation (Signed)
Physical Therapy Evaluation Patient Details Name: Kristen Gray MRN: [id number] DOB: Jul 18, 1945 Today's Date: 08/02/2020   History of Present Illness  This 75 y.o. female admitted with urinary frequency and lethargy.  Dx: r/o UTI, acute on chronic metabolic encephalopathy.  PMH includes: HTN, A-FIb, CKD, bradycardia, Chron's Disease, cognitive impairment  Clinical Impression  Pt admitted with above. She demonstrates the below listed deficits and will benefit from continued PT to maximize safety and independence with mobility.  Pt confused, oriented to self only, but does follow one-step commands inconsistently.  Pt currently requiring at least mod assist and multi-modal cues for all mobility tasks..  Per chart review, she was residing in ALF/memory care unit.  Recommend SNF level rehab to maximize her independence and strength to reduce risk of falls.    Follow Up Recommendations SNF    Equipment Recommendations  None recommended by PT    Recommendations for Other Services       Precautions / Restrictions Precautions Precautions: Fall      Mobility  Bed Mobility Overal bed mobility: Needs Assistance Bed Mobility: Sit to Supine;Supine to Sit     Supine to sit: Mod assist Sit to supine: Mod assist   General bed mobility comments: assist to intitiate and guide movement to lift trunk and LEs onto and off bed    Transfers Overall transfer level: Needs assistance Equipment used: Rolling walker (2 wheeled) Transfers: Sit to/from Stand Sit to Stand: Mod assist         General transfer comment: assist to bring wt up and fwd and to balance in standing  Ambulation/Gait             General Gait Details: Pt stood x 2 only.  Unsafe to attempt stepping without +2 assist 2* pts significant posterior lean and cognitive deficits  Stairs            Wheelchair Mobility    Modified Rankin (Stroke Patients Only)       Balance Overall balance assessment: Needs  assistance Sitting-balance support: Feet supported Sitting balance-Leahy Scale: Fair     Standing balance support: Bilateral upper extremity supported Standing balance-Leahy Scale: Poor Standing balance comment: requires mod a                             Pertinent Vitals/Pain Pain Assessment: No/denies pain Faces Pain Scale: Hurts even more Pain Location: buttocks Pain Descriptors / Indicators: Guarding;Grimacing;Crying Pain Intervention(s): Repositioned    Home Living Family/patient expects to be discharged to:: Skilled nursing facility                 Additional Comments: Pt was residing at [facility]/memory care per chart review    Prior Function           Comments: Pt unable to provide info and no family present.  Per chart review, she has had multiple falls with multiple ED visits.     Hand Dominance   Dominant Hand:  (uncertain)    Extremity/Trunk Assessment   Upper Extremity Assessment Upper Extremity Assessment: Overall WFL for tasks assessed    Lower Extremity Assessment Lower Extremity Assessment: Generalized weakness (based on observed functional mobility.  Pt does not follow cues for detailed accurate assessment)    Cervical / Trunk Assessment Cervical / Trunk Assessment: Kyphotic  Communication   Communication: Expressive difficulties  Cognition Arousal/Alertness: Awake/alert Behavior During Therapy: Anxious;Impulsive Overall Cognitive Status: No family/caregiver present to determine baseline  cognitive functioning                                 General Comments: Pt oriented to self only.  She mumbles incoherrently at times, then will complete a sentence.  She is very restless.  She follows one step commands inconsistently      General Comments      Exercises     Assessment/Plan    PT Assessment Patient needs continued PT services  PT Problem List Decreased strength;Decreased activity  tolerance;Decreased balance;Decreased mobility;Decreased cognition;Decreased knowledge of use of DME;Obesity       PT Treatment Interventions DME instruction;Gait training;Functional mobility training;Therapeutic activities;Therapeutic exercise;Patient/family education;Balance training    PT Goals (Current goals can be found in the Care Plan section)  Acute Rehab PT Goals Patient Stated Goal: Get warm    Frequency Min 2X/week   Barriers to discharge        Co-evaluation               AM-PAC PT "6 Clicks" Mobility  Outcome Measure Help needed turning from your back to your side while in a flat bed without using bedrails?: A Lot Help needed moving from lying on your back to sitting on the side of a flat bed without using bedrails?: A Lot Help needed moving to and from a bed to a chair (including a wheelchair)?: A Lot Help needed standing up from a chair using your arms (e.g., wheelchair or bedside chair)?: A Lot Help needed to walk in hospital room?: A Lot Help needed climbing 3-5 steps with a railing? : Total 6 Click Score: 11    End of Session Equipment Utilized During Treatment: Gait belt Activity Tolerance: Patient limited by fatigue Patient left: in bed;with call bell/phone within reach;with bed alarm set Nurse Communication: Mobility status PT Visit Diagnosis: Muscle weakness (generalized) (M62.81);Difficulty in walking, not elsewhere classified (R26.2);Unsteadiness on feet (R26.81)    Time: 5093-2671 PT Time Calculation (min) (ACUTE ONLY): 18 min   Charges:   PT Evaluation $PT Eval Moderate Complexity: 1 Mod          [place] Pager [phone] Office [phone]   Senay Sistrunk 08/02/2020, 4:10 PM

## 2020-08-02 NOTE — Evaluation (Signed)
Occupational Therapy Evaluation Patient Details Name: Kristen Gray MRN: [id number] DOB: 03-09-1945 Today's Date: 08/02/2020    History of Present Illness This 75 y.o. female admitted with urinary frequency and lethargy.  Dx: r/o UTI, acute on chronic metabolic encephalopathy.  PMH includes: HTN, A-FIb, CKD, bradycardia, Chron's Disease, cognitive impairment   Clinical Impression   Pt admitted with above. She demonstrates the below listed deficits and will benefit from continued OT to maximize safety and independence with BADLs.  Pt confused, oriented to self only, but does follow commands inconsistently.  She was able to complete toilet transfer with mod A, and simple grooming with min A.  Per chart review, she was residing in ALF/memory care unit.  Recommend SNF level rehab to maximize her independence and strength to reduce risk of falls.      Follow Up Recommendations  SNF    Equipment Recommendations  None recommended by OT    Recommendations for Other Services       Precautions / Restrictions Precautions Precautions: Fall      Mobility Bed Mobility Overal bed mobility: Needs Assistance Bed Mobility: Sit to Supine;Supine to Sit     Supine to sit: Mod assist Sit to supine: Mod assist   General bed mobility comments: assist to intitiate and guide movement to lift trunk and LEs onto and off bed    Transfers                      Balance Overall balance assessment: Needs assistance Sitting-balance support: Feet supported Sitting balance-Leahy Scale: Fair     Standing balance support: Single extremity supported;Bilateral upper extremity supported;During functional activity Standing balance-Leahy Scale: Poor Standing balance comment: requires mod a                           ADL either performed or assessed with clinical judgement   ADL Overall ADL's : Needs assistance/impaired     Grooming: Wash/dry hands;Wash/dry  face;Supervision/safety;Sitting   Upper Body Bathing: Maximal assistance;Sitting   Lower Body Bathing: Maximal assistance;Sit to/from stand   Upper Body Dressing : Moderate assistance;Sitting Upper Body Dressing Details (indicate cue type and reason): assisted with donning gown Lower Body Dressing: Total assistance;Sit to/from stand   Toilet Transfer: Moderate assistance;Stand-pivot;BSC Toilet Transfer Details (indicate cue type and reason): assist to stand, maintain balance and max cues to sequence Toileting- Clothing Manipulation and Hygiene: Total assistance;Sit to/from stand       Functional mobility during ADLs: Moderate assistance       Vision         Perception     Praxis      Pertinent Vitals/Pain Pain Assessment: Faces Faces Pain Scale: Hurts even more Pain Location: buttocks Pain Descriptors / Indicators: Guarding;Grimacing;Crying Pain Intervention(s): Repositioned     Hand Dominance  (uncertain)   Extremity/Trunk Assessment Upper Extremity Assessment Upper Extremity Assessment: Overall WFL for tasks assessed   Lower Extremity Assessment Lower Extremity Assessment: Defer to PT evaluation   Cervical / Trunk Assessment Cervical / Trunk Assessment: Kyphotic   Communication Communication Communication: Expressive difficulties   Cognition Arousal/Alertness: Awake/alert Behavior During Therapy: Anxious;Impulsive Overall Cognitive Status: No family/caregiver present to determine baseline cognitive functioning                                 General Comments: Pt oriented to self only.  She mumbles incoherrently  at times, then will complete a sentence.  She is very restless.  She follows one step commands inconsistently   General Comments       Exercises     Shoulder Instructions      Home Living Family/patient expects to be discharged to:: Skilled nursing facility                                 Additional Comments: Pt  was residing at [facility] ALF/memory care per chart review      Prior Functioning/Environment          Comments: Pt unable to provide info and no family present.  Per chart review, she has had multiple falls with multiple ED visits.        OT Problem List: Decreased strength;Decreased activity tolerance;Impaired balance (sitting and/or standing);Decreased cognition;Decreased safety awareness;Decreased knowledge of use of DME or AE;Pain      OT Treatment/Interventions: Self-care/ADL training;DME and/or AE instruction;Therapeutic activities;Cognitive remediation/compensation;Patient/family education;Balance training    OT Goals(Current goals can be found in the care plan section) Acute Rehab OT Goals OT Goal Formulation: Patient unable to participate in goal setting Time For Goal Achievement: 08/16/20 Potential to Achieve Goals: Good ADL Goals Pt Will Perform Eating: with set-up;with supervision;sitting Pt Will Perform Grooming: with min assist;standing Pt Will Transfer to Toilet: with min assist;regular height toilet;bedside commode;grab bars;stand pivot transfer Pt Will Perform Toileting - Clothing Manipulation and hygiene: with mod assist;sit to/from stand  OT Frequency: Min 2X/week   Barriers to D/C: Decreased caregiver support          Co-evaluation              AM-PAC OT "6 Clicks" Daily Activity     Outcome Measure Help from another person eating meals?: A Lot Help from another person taking care of personal grooming?: A Little Help from another person toileting, which includes using toliet, bedpan, or urinal?: A Lot Help from another person bathing (including washing, rinsing, drying)?: A Lot Help from another person to put on and taking off regular upper body clothing?: A Lot Help from another person to put on and taking off regular lower body clothing?: Total 6 Click Score: 12   End of Session Nurse Communication: Mobility status  Activity Tolerance: Other  (comment) (cognitive impairment) Patient left: in bed;with call bell/phone within reach;with bed alarm set  OT Visit Diagnosis: Unsteadiness on feet (R26.81);Cognitive communication deficit (R41.841)                Time: 3343-5686 OT Time Calculation (min): 33 min Charges:  OT General Charges $OT Visit: 1 Visit OT Evaluation $OT Eval Moderate Complexity: 1 Mod OT Treatments $Self Care/Home Management : 8-22 mins  Nilsa Nutting., OTR/L Acute Rehabilitation Services Pager [phone] Office [place], [place] 08/02/2020, 2:14 PM

## 2020-08-02 NOTE — Progress Notes (Signed)
PROGRESS NOTE  Kristen Gray  UXL:[id number] DOB: 1945/09/30 DOA: 07/31/2020 PCP: Garwin Brothers, MD   Brief Narrative: Kristen Gray is a 75 y.o. female with a history of dementia, Crohn's, HTN, HLD, and AFib on eliquis who was sent from [facility] due to lethargy and malodorous urine found to have urinalysis consistent with UTI. Levaquin was given pending urine culture.   Assessment & Plan: Active Problems:   UTI (urinary tract infection)  Acute metabolic encephalopathy on chronic dementia and depression:  - Continue ceftriaxone pending urine culture data.  - Continue depakote, trazodone, venlafaxine - Delirium precautions  UTI:  - CTX pending urine cultures. Does not appear septic.   Paroxysmal atrial fibrillation: Currently sinus bradycardia - Continue home flecainide  - Hold metoprolol for bradycardia - Continue eliquis  Crohn's disease: Quiescent at this time.  - Continue balsalazide   Stage IIIb CKD: CrCl stable.   Debility:  - PT/OT evaluation requested, suspect she may benefit from SNF prior to return to [facility].  GERD:  - PPI  Obesity: Estimated body mass index is 34.83 kg/m as calculated from the following:   Height as of this encounter: 4' 9"  (5.366 m).   Weight as of this encounter: 73 kg.  DVT prophylaxis: Eliquis Code Status: Full Family Communication: None at bedside Disposition Plan:  Status is: Inpatient  Remains inpatient appropriate because:Altered mental status  Dispo: The patient is from:  [facility]              Anticipated d/c is to: SNF              Patient currently is not medically stable to d/c.   Difficult to place patient No  Consultants:  None  Procedures:  None  Antimicrobials: Levaquin x1 7/9 Ceftriaxone 7/9 >>   Subjective: She has wet the bed and requesting linen change. Denies pain anywhere or dyspnea. Says she's not always so spacey. Thinks we're in [facility], knows she lives near [place], not the date.    Objective: Vitals:   08/01/20 1920 08/01/20 1929 08/02/20 0604 08/02/20 0752  BP: (!) 184/101 (!) 152/70 (!) 118/57   Pulse: (!) 55  (!) 52 (!) 55  Resp: 17  18   Temp: 98.1 F (36.7 C)  (!) 97.5 F (36.4 C)   TempSrc: Oral     SpO2: 99%  96%   Weight:      Height:       No intake or output data in the 24 hours ending 08/02/20 1402 Filed Weights   08/01/20 1729  Weight: 73 kg   Gen: 75 y.o. female in no distress fully disrobed herself in bed Pulm: Non-labored breathing. Clear to auscultation bilaterally.  CV: Regular rate and rhythm. No murmur, rub, or gallop. No JVD, no pitting pedal edema. GI: Abdomen soft, non-tender, non-distended, with normoactive bowel sounds. No organomegaly or masses felt. Ext: Warm, no deformities Skin: No rashes, lesions or ulcers on visualized skin Neuro: Alert and disoriented. No focal neurological deficits. Psych: Judgement and insight appear impaired. Mood & affect appropriate.   Data Reviewed: I have personally reviewed following labs and imaging studies  CBC: Recent Labs  Lab 07/31/20 1620 08/02/20 0521  WBC 12.0* 10.5  NEUTROABS 9.4*  --   HGB 9.8* 9.2*  HCT 31.9* 29.9*  MCV 87.4 87.2  PLT 269 440   Basic Metabolic Panel: Recent Labs  Lab 07/31/20 1620 08/02/20 0521  NA 135 137  K 3.8 3.6  CL 100 100  CO2 25 29  GLUCOSE 209* 116*  BUN 26* 20  CREATININE 1.21* 1.02*  CALCIUM 8.4* 8.4*  MG 1.8  --    GFR: Estimated Creatinine Clearance: 38.8 mL/min (A) (by C-G formula based on SCr of 1.02 mg/dL (H)). Liver Function Tests: Recent Labs  Lab 08/02/20 0521  AST 56*  ALT 19  ALKPHOS 144*  BILITOT 0.9  PROT 5.7*  ALBUMIN 2.5*   No results for input(s): LIPASE, AMYLASE in the last 168 hours. No results for input(s): AMMONIA in the last 168 hours. Coagulation Profile: No results for input(s): INR, PROTIME in the last 168 hours. Cardiac Enzymes: No results for input(s): CKTOTAL, CKMB, CKMBINDEX, TROPONINI in the  last 168 hours. BNP (last 3 results) No results for input(s): PROBNP in the last 8760 hours. HbA1C: No results for input(s): HGBA1C in the last 72 hours. CBG: No results for input(s): GLUCAP in the last 168 hours. Lipid Profile: No results for input(s): CHOL, HDL, LDLCALC, TRIG, CHOLHDL, LDLDIRECT in the last 72 hours. Thyroid Function Tests: No results for input(s): TSH, T4TOTAL, FREET4, T3FREE, THYROIDAB in the last 72 hours. Anemia Panel: No results for input(s): VITAMINB12, FOLATE, FERRITIN, TIBC, IRON, RETICCTPCT in the last 72 hours. Urine analysis:    Component Value Date/Time   COLORURINE AMBER (A) 07/31/2020 1754   APPEARANCEUR HAZY (A) 07/31/2020 1754   LABSPEC 1.025 07/31/2020 1754   PHURINE 5.0 07/31/2020 1754   GLUCOSEU NEGATIVE 07/31/2020 1754   HGBUR SMALL (A) 07/31/2020 1754   BILIRUBINUR NEGATIVE 07/31/2020 1754   KETONESUR NEGATIVE 07/31/2020 1754   PROTEINUR 100 (A) 07/31/2020 1754   NITRITE NEGATIVE 07/31/2020 1754   LEUKOCYTESUR MODERATE (A) 07/31/2020 1754   Recent Results (from the past 240 hour(s))  Resp Panel by RT-PCR (Flu A&B, Covid) Nasopharyngeal Swab     Status: None   Collection Time: 08/01/20  1:13 AM   Specimen: Nasopharyngeal Swab; Nasopharyngeal(NP) swabs in vial transport medium  Result Value Ref Range Status   SARS Coronavirus 2 by RT PCR NEGATIVE NEGATIVE Final    Comment: (NOTE) SARS-CoV-2 target nucleic acids are NOT DETECTED.  The SARS-CoV-2 RNA is generally detectable in upper respiratory specimens during the acute phase of infection. The lowest concentration of SARS-CoV-2 viral copies this assay can detect is 138 copies/mL. A negative result does not preclude SARS-Cov-2 infection and should not be used as the sole basis for treatment or other patient management decisions. A negative result may occur with  improper specimen collection/handling, submission of specimen other than nasopharyngeal swab, presence of viral mutation(s)  within the areas targeted by this assay, and inadequate number of viral copies(<138 copies/mL). A negative result must be combined with clinical observations, patient history, and epidemiological information. The expected result is Negative.  Fact Sheet for Patients:  EntrepreneurPulse.com.au  Fact Sheet for Healthcare Providers:  IncredibleEmployment.be  This test is no t yet approved or cleared by the [place] FDA and  has been authorized for detection and/or diagnosis of SARS-CoV-2 by FDA under an Emergency Use Authorization (EUA). This EUA will remain  in effect (meaning this test can be used) for the duration of the COVID-19 declaration under Section 564(b)(1) of the Act, 21 U.S.C.section 360bbb-3(b)(1), unless the authorization is terminated  or revoked sooner.       Influenza A by PCR NEGATIVE NEGATIVE Final   Influenza B by PCR NEGATIVE NEGATIVE Final    Comment: (NOTE) The Xpert Xpress SARS-CoV-2/FLU/RSV plus assay is intended as an aid in the diagnosis  of influenza from Nasopharyngeal swab specimens and should not be used as a sole basis for treatment. Nasal washings and aspirates are unacceptable for Xpert Xpress SARS-CoV-2/FLU/RSV testing.  Fact Sheet for Patients: EntrepreneurPulse.com.au  Fact Sheet for Healthcare Providers: IncredibleEmployment.be  This test is not yet approved or cleared by the [place] FDA and has been authorized for detection and/or diagnosis of SARS-CoV-2 by FDA under an Emergency Use Authorization (EUA). This EUA will remain in effect (meaning this test can be used) for the duration of the COVID-19 declaration under Section 564(b)(1) of the Act, 21 U.S.C. section 360bbb-3(b)(1), unless the authorization is terminated or revoked.  Performed at [facility], [place] [address], [place],  [address]       Radiology Studies: No  results found.  Scheduled Meds:  apixaban  5 mg Oral BID   balsalazide  1,500 mg Oral BID   divalproex  125 mg Oral BID   feeding supplement  237 mL Oral BID BM   flecainide  100 mg Oral Q12H   pantoprazole  40 mg Oral Daily   sertraline  50 mg Oral Daily   traZODone  50 mg Oral QHS   venlafaxine XR  75 mg Oral Q48H   vitamin B-12  1,000 mcg Oral Daily   Continuous Infusions:  cefTRIAXone (ROCEPHIN)  IV 2 g (08/02/20 0812)     LOS: 2 days   Time spent: 25 minutes.  Patrecia Pour, MD [facility] [email or web address] 08/02/2020, 2:02 PM

## 2020-08-03 MED ORDER — LORAZEPAM 0.5 MG PO TABS: 0.5000 mg | ORAL_TABLET | Freq: Two times a day (BID) | ORAL | 0 refills | Status: AC | PRN

## 2020-08-03 MED ORDER — ORAL CARE MOUTH RINSE
15.0000 mL | Freq: Two times a day (BID) | OROMUCOSAL | Status: DC
  Administered 2020-08-03 – 2020-08-05 (×5): 15 mL via OROMUCOSAL

## 2020-08-03 MED ORDER — METOPROLOL SUCCINATE 50 MG PO CS24: 50.0000 mg | EXTENDED_RELEASE_CAPSULE | Freq: Every day | ORAL | Status: AC

## 2020-08-03 NOTE — TOC Progression Note (Signed)
Transition of Care [facility]) - Progression Note    Patient Details  Name: Kristen Gray MRN: [id number] Date of Birth: [birth year]  Transition of Care [facility]) CM/SW [place], [place] Phone Number: 08/03/2020, 11:34 AM  Clinical Narrative:   Patient who is stable for d/c has SNF bed offer at [facility].  Spoke with sister to update her.  Called insurance to initiate authorization.  Texted with Lexine Baton at [facility] to let her know we are hoping to get her over there today. TOC will continue to follow during the course of hospitalization.     Expected Discharge Plan: [place] Barriers to Discharge: Continued Medical Work up, Ship broker  Expected Discharge Plan and Services Expected Discharge Plan: [place] In-house Referral: Clinical Social Work     Living arrangements for the past 2 months: [place] Expected Discharge Date: 08/03/20                                     Social Determinants of Health (SDOH) Interventions    Readmission Risk Interventions No flowsheet data found.

## 2020-08-03 NOTE — Plan of Care (Signed)
Pt continues to attempt to get out of bed at times.  She has required PRN Ativan at HS for crying, yelling out and looking for her husband.  Floor mats initiated due to increased fall risk.    Ayesha Mohair BSN RN CMSRN   Problem: Coping: Goal: Level of anxiety will decrease Outcome: Not Progressing   Problem: Safety: Goal: Ability to remain free from injury will improve Outcome: Not Progressing

## 2020-08-03 NOTE — Discharge Summary (Addendum)
Physician Discharge Summary  Kristen Gray YBW:[id number] DOB: 15-Jun-1945 DOA: 07/31/2020  PCP: Garwin Brothers, MD  Admit date: 07/31/2020 Discharge date: 08/04/2020  Admitted From: [facility] Disposition: SNF   Recommendations for Outpatient Follow-up:  Follow up with PCP in 1-2 weeks. Follow up with cardiology for atrial fibrillation. Recommend holding metoprolol for HR <60bpm.   Home Health: N/A Equipment/Devices: Per SNF Discharge Condition: Stable CODE STATUS: Full Diet recommendation: Heart healthy  Brief/Interim Summary: Kristen Gray is a 75 y.o. female with a history of dementia, Crohn's, HTN, HLD, and AFib on eliquis who was sent from [facility] due to lethargy and malodorous urine found to have urinalysis consistent with UTI. Levaquin was given initially and changed to ceftriaxone with resolution of mild leukocytosis. She has tolerated this without issues and remains hemodynamically stable. She has become more alert and interactive, but remains confused which is presumed baseline.   Discharge Diagnoses:  Active Problems:   UTI (urinary tract infection)  Acute metabolic encephalopathy on chronic dementia and depression: - Treated UTI with improvement in level of arousal. She's eating, drinking, voiding and stooling. Lethargy has resolved. No focal deficits.  - Continue depakote, trazodone, venlafaxine - Delirium precautions   UTI: - Has received 3 daily doses of antibiotics (levaquin x1, ceftriaxone x2 from 7/9 - 7/11). Remains afebrile, hemodynamically stable. Leukocytosis resolved after 1st dose.    Paroxysmal atrial fibrillation: Currently sinus bradycardia - Continue home flecainide - Hold metoprolol for bradycardia (HR <60bpm) - Continue eliquis   Crohn's disease: Quiescent at this time. - Continue balsalazide   Stage IIIb CKD: CrCl stable.   Debility: - PT/OT evaluations performed, stating this patient will benefit from SNF prior to return to  [place].   GERD: - PPI   Obesity: Estimated body mass index is 34.83 kg/m  Discharge Instructions  Allergies as of 08/03/2020       Reactions   Penicillin G Nausea Only   Has patient had a PCN reaction causing immediate rash, facial/tongue/throat swelling, SOB or lightheadedness with hypotension:No Has patient had a PCN reaction causing severe rash involving mucus membranes or skin necrosis: No Has patient had a PCN reaction that required hospitalization: No Has patient had a PCN reaction occurring within the last 10 years: No--NAUSEA ONLY If all of the above answers are "NO", then may proceed with Cephalosporin use.        Medication List     TAKE these medications    acetaminophen 500 MG tablet Commonly known as: TYLENOL Take 1,000 mg by mouth every 6 (six) hours as needed for mild pain.   apixaban 5 MG Tabs tablet Commonly known as: ELIQUIS Take 1 tablet (5 mg total) by mouth 2 (two) times daily.   balsalazide 750 MG capsule Commonly known as: COLAZAL Take 2 capsules (1,500 mg total) by mouth 2 (two) times daily.   Calcium-Vitamin D3 600-200 MG-UNIT Tabs Take 2 tablets by mouth daily.   divalproex 125 MG capsule Commonly known as: DEPAKOTE SPRINKLE Take 125 mg by mouth 2 (two) times daily.   esomeprazole 20 MG capsule Commonly known as: NEXIUM Take 20 mg by mouth daily.   flecainide 100 MG tablet Commonly known as: TAMBOCOR Take 1 tablet (100 mg total) by mouth every 12 (twelve) hours.   LORazepam 0.5 MG tablet Commonly known as: ATIVAN Take 1 tablet (0.5 mg total) by mouth every 12 (twelve) hours as needed for anxiety.   Metoprolol Succinate 50 MG Cs24 Take 50 mg by mouth daily.  Metoprolol Succinate Capsule ER 24 hour sprinkle 19m. HOLD for HR <60bpm. What changed:  additional instructions Another medication with the same name was removed. Continue taking this medication, and follow the directions you see here.   sertraline 50 MG tablet Commonly  known as: ZOLOFT Take 50 mg by mouth daily.   traZODone 50 MG tablet Commonly known as: DESYREL Take 50 mg by mouth at bedtime. What changed: Another medication with the same name was removed. Continue taking this medication, and follow the directions you see here.   Venlafaxine HCl 75 MG Tb24 Take 75 mg by mouth every other day. What changed: Another medication with the same name was removed. Continue taking this medication, and follow the directions you see here.   vitamin B-12 1000 MCG tablet Commonly known as: CYANOCOBALAMIN Take 1 tablet (1,000 mcg total) by mouth daily.   Vitamin D3 50 MCG (2000 UT) capsule TAKE 1 CAPSULE BY MOUTH EVERY DAY        Follow-up Information     AGarwin Brothers MD Follow up.   Specialty: Internal Medicine Contact information: [address] [phone]               Allergies  Allergen Reactions   Penicillin G Nausea Only    Has patient had a PCN reaction causing immediate rash, facial/tongue/throat swelling, SOB or lightheadedness with hypotension:No Has patient had a PCN reaction causing severe rash involving mucus membranes or skin necrosis: No Has patient had a PCN reaction that required hospitalization: No Has patient had a PCN reaction occurring within the last 10 years: No--NAUSEA ONLY If all of the above answers are "NO", then may proceed with Cephalosporin use.     Consultations: None  Procedures/Studies: No results found.   Subjective: Ate all of breakfast. Has been calling out somewhat regularly. No lethargy reported. Denies pain anywhere.   Discharge Exam: Vitals:   08/02/20 1915 08/03/20 0617  BP: (!) 170/72 (!) 167/56  Pulse: 61 (!) 54  Resp: 16 17  Temp: 97.8 F (36.6 C) 97.8 F (36.6 C)  SpO2: 99% 97%   General: Alert, elderly female in no distress in bed. Cardiovascular: Bradycardic, S1/S2 +, no rubs, no gallops Respiratory: CTA bilaterally, no wheezing, no  rhonchi Abdominal: Soft, NT, ND, bowel sounds + Extremities: No edema, no cyanosis Neuro: Alert, not oriented, follows some commands, no focal deficits noted.  Labs: BNP (last 3 results) Recent Labs    03/04/20 1801  BNP 8355.9   Basic Metabolic Panel: Recent Labs  Lab 07/31/20 1620 08/02/20 0521  NA 135 137  K 3.8 3.6  CL 100 100  CO2 25 29  GLUCOSE 209* 116*  BUN 26* 20  CREATININE 1.21* 1.02*  CALCIUM 8.4* 8.4*  MG 1.8  --    Liver Function Tests: Recent Labs  Lab 08/02/20 0521  AST 56*  ALT 19  ALKPHOS 144*  BILITOT 0.9  PROT 5.7*  ALBUMIN 2.5*   No results for input(s): LIPASE, AMYLASE in the last 168 hours. No results for input(s): AMMONIA in the last 168 hours. CBC: Recent Labs  Lab 07/31/20 1620 08/02/20 0521  WBC 12.0* 10.5  NEUTROABS 9.4*  --   HGB 9.8* 9.2*  HCT 31.9* 29.9*  MCV 87.4 87.2  PLT 269 218   Cardiac Enzymes: No results for input(s): CKTOTAL, CKMB, CKMBINDEX, TROPONINI in the last 168 hours. BNP: Invalid input(s): POCBNP CBG: No results for input(s): GLUCAP in the last  168 hours. D-Dimer No results for input(s): DDIMER in the last 72 hours. Hgb A1c No results for input(s): HGBA1C in the last 72 hours. Lipid Profile No results for input(s): CHOL, HDL, LDLCALC, TRIG, CHOLHDL, LDLDIRECT in the last 72 hours. Thyroid function studies No results for input(s): TSH, T4TOTAL, T3FREE, THYROIDAB in the last 72 hours.  Invalid input(s): FREET3 Anemia work up No results for input(s): VITAMINB12, FOLATE, FERRITIN, TIBC, IRON, RETICCTPCT in the last 72 hours. Urinalysis    Component Value Date/Time   COLORURINE AMBER (A) 07/31/2020 1754   APPEARANCEUR HAZY (A) 07/31/2020 1754   LABSPEC 1.025 07/31/2020 1754   PHURINE 5.0 07/31/2020 1754   GLUCOSEU NEGATIVE 07/31/2020 1754   HGBUR SMALL (A) 07/31/2020 1754   BILIRUBINUR NEGATIVE 07/31/2020 1754   KETONESUR NEGATIVE 07/31/2020 1754   PROTEINUR 100 (A) 07/31/2020 1754   NITRITE  NEGATIVE 07/31/2020 1754   LEUKOCYTESUR MODERATE (A) 07/31/2020 1754    Microbiology Recent Results (from the past 240 hour(s))  Resp Panel by RT-PCR (Flu A&B, Covid) Nasopharyngeal Swab     Status: None   Collection Time: 08/01/20  1:13 AM   Specimen: Nasopharyngeal Swab; Nasopharyngeal(NP) swabs in vial transport medium  Result Value Ref Range Status   SARS Coronavirus 2 by RT PCR NEGATIVE NEGATIVE Final    Comment: (NOTE) SARS-CoV-2 target nucleic acids are NOT DETECTED.  The SARS-CoV-2 RNA is generally detectable in upper respiratory specimens during the acute phase of infection. The lowest concentration of SARS-CoV-2 viral copies this assay can detect is 138 copies/mL. A negative result does not preclude SARS-Cov-2 infection and should not be used as the sole basis for treatment or other patient management decisions. A negative result may occur with  improper specimen collection/handling, submission of specimen other than nasopharyngeal swab, presence of viral mutation(s) within the areas targeted by this assay, and inadequate number of viral copies(<138 copies/mL). A negative result must be combined with clinical observations, patient history, and epidemiological information. The expected result is Negative.  Fact Sheet for Patients:  EntrepreneurPulse.com.au  Fact Sheet for Healthcare Providers:  IncredibleEmployment.be  This test is no t yet approved or cleared by the [place] FDA and  has been authorized for detection and/or diagnosis of SARS-CoV-2 by FDA under an Emergency Use Authorization (EUA). This EUA will remain  in effect (meaning this test can be used) for the duration of the COVID-19 declaration under Section 564(b)(1) of the Act, 21 U.S.C.section 360bbb-3(b)(1), unless the authorization is terminated  or revoked sooner.       Influenza A by PCR NEGATIVE NEGATIVE Final   Influenza B by PCR NEGATIVE NEGATIVE Final     Comment: (NOTE) The Xpert Xpress SARS-CoV-2/FLU/RSV plus assay is intended as an aid in the diagnosis of influenza from Nasopharyngeal swab specimens and should not be used as a sole basis for treatment. Nasal washings and aspirates are unacceptable for Xpert Xpress SARS-CoV-2/FLU/RSV testing.  Fact Sheet for Patients: EntrepreneurPulse.com.au  Fact Sheet for Healthcare Providers: IncredibleEmployment.be  This test is not yet approved or cleared by the [place] FDA and has been authorized for detection and/or diagnosis of SARS-CoV-2 by FDA under an Emergency Use Authorization (EUA). This EUA will remain in effect (meaning this test can be used) for the duration of the COVID-19 declaration under Section 564(b)(1) of the Act, 21 U.S.C. section 360bbb-3(b)(1), unless the authorization is terminated or revoked.  Performed at [facility], [address] [place], [place] [address]     Time  coordinating discharge: Approximately 40 minutes  Patrecia Pour, MD  [facility] 08/03/2020, 11:16 AM

## 2020-08-04 LAB — SARS CORONAVIRUS 2 (TAT 6-24 HRS): SARS Coronavirus 2: NEGATIVE

## 2020-08-04 MED ORDER — AMLODIPINE BESYLATE 10 MG PO TABS
10.0000 mg | ORAL_TABLET | Freq: Once | ORAL | Status: AC
  Administered 2020-08-04: 10 mg via ORAL
  Filled 2020-08-04: qty 1

## 2020-08-04 NOTE — TOC Progression Note (Signed)
Transition of Care [facility]) - Progression Note    Patient Details  Name: Kristen Gray MRN: [id number] Date of Birth: [birth year]  Transition of Care [facility]) CM/SW [place],  Phone Number: 08/04/2020, 10:41 AM  Clinical Narrative:   Patient who is stable for d/c will go to [facility] today pending insurance authorization, paperwork from sister. COVID test in place, and bed is available today if all comes together. TOC will continue to follow during the course of hospitalization.      Expected Discharge Plan: [place] Barriers to Discharge: Continued Medical Work up, Ship broker  Expected Discharge Plan and Services Expected Discharge Plan: [place] In-house Referral: Clinical Social Work     Living arrangements for the past 2 months: [place] Expected Discharge Date: 08/04/20                                     Social Determinants of Health (SDOH) Interventions    Readmission Risk Interventions No flowsheet data found.

## 2020-08-04 NOTE — Progress Notes (Signed)
Patient was asleep after being awake all night, unable to get f/u BP after PRN dose. On-coming nurse notified that f/u is needed.

## 2020-08-04 NOTE — Progress Notes (Signed)
S: No complaints. Eating breakfast independently.   O: BP (!) 147/96   Pulse 64   Temp 98.2 F (36.8 C) (Oral)   Resp 20   Ht 4' 9"  (1.448 m)   Wt 73 kg   SpO2 95%   BMI 34.83 kg/m   Nontoxic Clear, nonlabored.  RRR  A/P: Pt remains stable for discharge.   Vance Gather, MD 08/04/2020 9:28 AM

## 2020-08-05 DIAGNOSIS — G9341 Metabolic encephalopathy: Secondary | ICD-10-CM

## 2020-08-05 NOTE — TOC Progression Note (Addendum)
Transition of Care [facility]) - Progression Note    Patient Details  Name: Kristen Gray MRN: [id number] Date of Birth: Jun 19, 1945  Transition of Care [facility]) CM/SW [place], [place] Phone Number: 08/05/2020, 8:45 AM  Clinical Narrative:   Insurance denied SNF, approved transportation. Approval [phone].  Dr Amalia Hailey # for appeal is  [phone].   Denied due to dementia, multiple ED visits. She will need COVID booster if appeal is overturned for admission to [facility]. TOC will continue to follow during the course of hospitalization.    Expected Discharge Plan: Skilled Nursing Facility Barriers to Discharge: Other (must enter comment) (Insurance denied, in appeal)  Expected Discharge Plan and Services Expected Discharge Plan: [place] In-house Referral: Clinical Social Work     Living arrangements for the past 2 months: [place] Expected Discharge Date: 08/04/20                                     Social Determinants of Health (SDOH) Interventions    Readmission Risk Interventions No flowsheet data found.

## 2020-08-05 NOTE — Discharge Summary (Signed)
Physician Discharge Summary  Kristen Gray OVF:[id number] DOB: December 20, 1945 DOA: 07/31/2020  PCP: Garwin Brothers, MD  Admit date: 07/31/2020 Discharge date: 08/05/2020  Admitted From: [place] Disposition: [place]  Recommendations for Outpatient Follow-up:  Follow up with PCP in 1-2 weeks. Follow up with cardiology for atrial fibrillation. Recommend holding metoprolol for HR <60bpm.  Recommend outpatient Palliative care evaluation and follow-up for goals of care discussion  Home Health: N/A Equipment/Devices: None Discharge Condition: Stable CODE STATUS: Full Diet recommendation: Heart healthy  Brief/Interim Summary: 75 y.o. female with a history of dementia, Crohn's, HTN, HLD, and AFib on eliquis who was sent from [facility] due to lethargy and malodorous urine and was found to have urinalysis consistent with UTI. Levaquin was given initially and changed to ceftriaxone with resolution of mild leukocytosis. She has tolerated this without issues and remains hemodynamically stable. She has become more alert and interactive, but remains confused which is presumed baseline.  PT initially recommended SNF placement which was denied by insurance.  She will be discharged back to [facility].  Discharge Diagnoses:   Acute metabolic encephalopathy on chronic dementia and depression: - Treated UTI with improvement in level of arousal. She's eating, drinking, voiding and stooling. Lethargy has resolved. No focal deficits.  - Continue depakote, trazodone, venlafaxine - Delirium precautions   UTI: - Has received 3 daily doses of antibiotics (levaquin x1, ceftriaxone x2 from 7/9 - 7/11). Remains afebrile, hemodynamically stable. Leukocytosis resolved after 1st dose.  -No need for any more antibiotics on discharge   Paroxysmal atrial fibrillation: Currently sinus bradycardia - Continue home flecainide - Hold metoprolol for bradycardia (HR <60bpm) - Continue eliquis    Crohn's disease: Quiescent at this time. - Continue balsalazide   Stage IIIb CKD: CrCl stable.   Debility: - PT initially recommended SNF placement which was denied by insurance.  She will be discharged back to [facility].   GERD: - PPI   Obesity: Estimated body mass index is 34.83 kg/m -Outpatient follow-up  Discharge Instructions  Allergies as of 08/05/2020       Reactions   Penicillin G Nausea Only   Has patient had a PCN reaction causing immediate rash, facial/tongue/throat swelling, SOB or lightheadedness with hypotension:No Has patient had a PCN reaction causing severe rash involving mucus membranes or skin necrosis: No Has patient had a PCN reaction that required hospitalization: No Has patient had a PCN reaction occurring within the last 10 years: No--NAUSEA ONLY If all of the above answers are "NO", then may proceed with Cephalosporin use.        Medication List     TAKE these medications    acetaminophen 500 MG tablet Commonly known as: TYLENOL Take 1,000 mg by mouth every 6 (six) hours as needed for mild pain.   apixaban 5 MG Tabs tablet Commonly known as: ELIQUIS Take 1 tablet (5 mg total) by mouth 2 (two) times daily.   balsalazide 750 MG capsule Commonly known as: COLAZAL Take 2 capsules (1,500 mg total) by mouth 2 (two) times daily.   Calcium-Vitamin D3 600-200 MG-UNIT Tabs Take 2 tablets by mouth daily.   divalproex 125 MG capsule Commonly known as: DEPAKOTE SPRINKLE Take 125 mg by mouth 2 (two) times daily.   esomeprazole 20 MG capsule Commonly known as: NEXIUM Take 20 mg by mouth daily.   flecainide 100 MG tablet Commonly known as: TAMBOCOR Take 1 tablet (100 mg total) by mouth every 12 (twelve) hours.   LORazepam  0.5 MG tablet Commonly known as: ATIVAN Take 1 tablet (0.5 mg total) by mouth every 12 (twelve) hours as needed for anxiety.   Metoprolol Succinate 50 MG Cs24 Take 50 mg by mouth daily. Metoprolol Succinate  Capsule ER 24 hour sprinkle 31m. HOLD for HR <60bpm. What changed:  additional instructions Another medication with the same name was removed. Continue taking this medication, and follow the directions you see here.   sertraline 50 MG tablet Commonly known as: ZOLOFT Take 50 mg by mouth daily.   traZODone 50 MG tablet Commonly known as: DESYREL Take 50 mg by mouth at bedtime. What changed: Another medication with the same name was removed. Continue taking this medication, and follow the directions you see here.   Venlafaxine HCl 75 MG Tb24 Take 75 mg by mouth every other day. What changed: Another medication with the same name was removed. Continue taking this medication, and follow the directions you see here.   vitamin B-12 1000 MCG tablet Commonly known as: CYANOCOBALAMIN Take 1 tablet (1,000 mcg total) by mouth daily.   Vitamin D3 50 MCG (2000 UT) capsule TAKE 1 CAPSULE BY MOUTH EVERY DAY        Follow-up Information     AGarwin Brothers MD Follow up.   Specialty: Internal Medicine Contact information: [address] [phone]               Allergies  Allergen Reactions   Penicillin G Nausea Only    Has patient had a PCN reaction causing immediate rash, facial/tongue/throat swelling, SOB or lightheadedness with hypotension:No Has patient had a PCN reaction causing severe rash involving mucus membranes or skin necrosis: No Has patient had a PCN reaction that required hospitalization: No Has patient had a PCN reaction occurring within the last 10 years: No--NAUSEA ONLY If all of the above answers are "NO", then may proceed with Cephalosporin use.     Consultations: None  Procedures/Studies: None   Subjective: Patient seen and examined at bedside.  Poor historian.  No overnight fever, vomiting reported.  Discharge Exam: Vitals:   08/04/20 1947 08/05/20 0604  BP: (!) 150/55 (!) 156/85  Pulse: 68 71  Resp: 18 20  Temp:  98.3 F (36.8 C) (!) 97.4 F (36.3 C)  SpO2: 97% 96%   General: No acute distress.  Poor historian.  Awake, very slow to respond. Elderly female lying on bed.   Cardiovascular: S1-S2 heard, rate controlled  respiratory: Bilateral decreased breath sounds at bases  abdominal: Soft, NT, ND, bowel sounds + Extremities: No edema, no cyanosis   Labs: BNP (last 3 results) Recent Labs    03/04/20 1801  BNP 877.8*    Basic Metabolic Panel: Recent Labs  Lab 07/31/20 1620 08/02/20 0521  NA 135 137  K 3.8 3.6  CL 100 100  CO2 25 29  GLUCOSE 209* 116*  BUN 26* 20  CREATININE 1.21* 1.02*  CALCIUM 8.4* 8.4*  MG 1.8  --     Liver Function Tests: Recent Labs  Lab 08/02/20 0521  AST 56*  ALT 19  ALKPHOS 144*  BILITOT 0.9  PROT 5.7*  ALBUMIN 2.5*    No results for input(s): LIPASE, AMYLASE in the last 168 hours. No results for input(s): AMMONIA in the last 168 hours. CBC: Recent Labs  Lab 07/31/20 1620 08/02/20 0521  WBC 12.0* 10.5  NEUTROABS 9.4*  --   HGB 9.8* 9.2*  HCT 31.9* 29.9*  MCV 87.4  87.2  PLT 269 218    Cardiac Enzymes: No results for input(s): CKTOTAL, CKMB, CKMBINDEX, TROPONINI in the last 168 hours. BNP: Invalid input(s): POCBNP CBG: No results for input(s): GLUCAP in the last 168 hours. D-Dimer No results for input(s): DDIMER in the last 72 hours. Hgb A1c No results for input(s): HGBA1C in the last 72 hours. Lipid Profile No results for input(s): CHOL, HDL, LDLCALC, TRIG, CHOLHDL, LDLDIRECT in the last 72 hours. Thyroid function studies No results for input(s): TSH, T4TOTAL, T3FREE, THYROIDAB in the last 72 hours.  Invalid input(s): FREET3 Anemia work up No results for input(s): VITAMINB12, FOLATE, FERRITIN, TIBC, IRON, RETICCTPCT in the last 72 hours. Urinalysis    Component Value Date/Time   COLORURINE AMBER (A) 07/31/2020 1754   APPEARANCEUR HAZY (A) 07/31/2020 1754   LABSPEC 1.025 07/31/2020 1754   PHURINE 5.0 07/31/2020 1754    GLUCOSEU NEGATIVE 07/31/2020 1754   HGBUR SMALL (A) 07/31/2020 1754   BILIRUBINUR NEGATIVE 07/31/2020 1754   KETONESUR NEGATIVE 07/31/2020 1754   PROTEINUR 100 (A) 07/31/2020 1754   NITRITE NEGATIVE 07/31/2020 1754   LEUKOCYTESUR MODERATE (A) 07/31/2020 1754    Microbiology Recent Results (from the past 240 hour(s))  Resp Panel by RT-PCR (Flu A&B, Covid) Nasopharyngeal Swab     Status: None   Collection Time: 08/01/20  1:13 AM   Specimen: Nasopharyngeal Swab; Nasopharyngeal(NP) swabs in vial transport medium  Result Value Ref Range Status   SARS Coronavirus 2 by RT PCR NEGATIVE NEGATIVE Final    Comment: (NOTE) SARS-CoV-2 target nucleic acids are NOT DETECTED.  The SARS-CoV-2 RNA is generally detectable in upper respiratory specimens during the acute phase of infection. The lowest concentration of SARS-CoV-2 viral copies this assay can detect is 138 copies/mL. A negative result does not preclude SARS-Cov-2 infection and should not be used as the sole basis for treatment or other patient management decisions. A negative result may occur with  improper specimen collection/handling, submission of specimen other than nasopharyngeal swab, presence of viral mutation(s) within the areas targeted by this assay, and inadequate number of viral copies(<138 copies/mL). A negative result must be combined with clinical observations, patient history, and epidemiological information. The expected result is Negative.  Fact Sheet for Patients:  EntrepreneurPulse.com.au  Fact Sheet for Healthcare Providers:  IncredibleEmployment.be  This test is no t yet approved or cleared by the [place] FDA and  has been authorized for detection and/or diagnosis of SARS-CoV-2 by FDA under an Emergency Use Authorization (EUA). This EUA will remain  in effect (meaning this test can be used) for the duration of the COVID-19 declaration under Section 564(b)(1) of the Act,  21 U.S.C.section 360bbb-3(b)(1), unless the authorization is terminated  or revoked sooner.       Influenza A by PCR NEGATIVE NEGATIVE Final   Influenza B by PCR NEGATIVE NEGATIVE Final    Comment: (NOTE) The Xpert Xpress SARS-CoV-2/FLU/RSV plus assay is intended as an aid in the diagnosis of influenza from Nasopharyngeal swab specimens and should not be used as a sole basis for treatment. Nasal washings and aspirates are unacceptable for Xpert Xpress SARS-CoV-2/FLU/RSV testing.  Fact Sheet for Patients: EntrepreneurPulse.com.au  Fact Sheet for Healthcare Providers: IncredibleEmployment.be  This test is not yet approved or cleared by the [place] FDA and has been authorized for detection and/or diagnosis of SARS-CoV-2 by FDA under an Emergency Use Authorization (EUA). This EUA will remain in effect (meaning this test can be used) for the duration of the COVID-19 declaration  under Section 564(b)(1) of the Act, 21 U.S.C. section 360bbb-3(b)(1), unless the authorization is terminated or revoked.  Performed at [facility], [place] [address], [place], [place] [address]   SARS CORONAVIRUS 2 (TAT 6-24 HRS) Nasopharyngeal Nasopharyngeal Swab     Status: None   Collection Time: 08/03/20  5:11 PM   Specimen: Nasopharyngeal Swab  Result Value Ref Range Status   SARS Coronavirus 2 NEGATIVE NEGATIVE Final    Comment: (NOTE) SARS-CoV-2 target nucleic acids are NOT DETECTED.  The SARS-CoV-2 RNA is generally detectable in upper and lower respiratory specimens during the acute phase of infection. Negative results do not preclude SARS-CoV-2 infection, do not rule out co-infections with other pathogens, and should not be used as the sole basis for treatment or other patient management decisions. Negative results must be combined with clinical observations, patient history, and epidemiological information. The expected result is  Negative.  Fact Sheet for Patients: SugarRoll.be  Fact Sheet for Healthcare Providers: https://www.woods-mathews.com/  This test is not yet approved or cleared by the [place] FDA and  has been authorized for detection and/or diagnosis of SARS-CoV-2 by FDA under an Emergency Use Authorization (EUA). This EUA will remain  in effect (meaning this test can be used) for the duration of the COVID-19 declaration under Se ction 564(b)(1) of the Act, 21 U.S.C. section 360bbb-3(b)(1), unless the authorization is terminated or revoked sooner.  Performed at [facility], [place] [address] [place],  [address]     Time coordinating discharge: Approximately 35 minutes  Aline August, MD  [facility] 08/05/2020, 9:41 AM

## 2020-08-05 NOTE — TOC Transition Note (Signed)
Transition of Care [facility]) - CM/SW Discharge Note   Patient Details  Name: Andretta Ergle MRN: [id number] Date of Birth: [birth year]  [facility]) CM/SW Contact:  Trish Mage, LCSW Phone Number: 08/05/2020, 10:17 AM   Clinical Narrative:   Patient who is stable for d/c will return to [facility] today.  Sister alerted.  FL2 and d/c summary faxed to facility.  PTAR arranged.  Nursing, please call report to [phone], ask for nurse Kirke Shaggy or med tech [place]. TOC sign off.    Final next level of care: Assisted Living Barriers to Discharge: Barriers Resolved   Patient Goals and CMS Choice Patient states their goals for this hospitalization and ongoing recovery are:: CSW spoke to the patient's sister who would like for this patient to return to [facility] after STR. CMS Medicare.gov Compare Post Acute Care list provided to:: Patient Represenative (must comment) (Sister is legal guardian) Choice offered to / list presented to : [place] / [place]  Discharge Placement                       Discharge Plan and Services In-house Referral: Clinical Social Work                                   Social Determinants of Health (SDOH) Interventions     Readmission Risk Interventions No flowsheet data found.

## 2020-08-05 NOTE — NC FL2 (Signed)
[place] LEVEL OF CARE SCREENING TOOL     IDENTIFICATION  Patient Name: Kristen Gray Birthdate: Mar 11, 1945 Sex: female Admission Date (Current Location): 07/31/2020  [facility] [place] Number:  Herbalist and Address:  [facility],  [place], [place]      Provider Number: [id number]  Attending Physician Name and Address:  Aline August, MD  Relative Name and Phone Number:  [name] (Sister)   [phone]    Current Level of Care: Hospital Recommended Level of Care: [place], Memory Care Prior Approval Number:    Date Approved/Denied:   PASRR Number: [id number]  Discharge Plan: Domiciliary (Rest home) [place] [facility])    Current Diagnoses: Patient Active Problem List   Diagnosis Date Noted   UTI (urinary tract infection) 07/31/2020   Atrial fibrillation with RVR ([place]) 03/05/2020   Atrial fibrillation ([place]) 03/05/2020   AKI (acute kidney injury) ([place])    Moderate episode of recurrent major depressive disorder ([place]) 09/16/2019   Crohn disease ([place]) 02/25/2019   Chronic fatigue 02/25/2019   Vitamin B 12 deficiency 02/25/2019   Unilateral primary osteoarthritis, left knee 03/27/2018   Hypokalemia 02/20/2018   GERD (gastroesophageal reflux disease) 07/28/2017   Unilateral primary osteoarthritis, right knee 01/31/2017   Sebaceous cyst 10/21/2016   Anemia 10/20/2016   Primary osteoarthritis of right hip 09/12/2016   Pulmonary hypertension ([place]) 07/26/2016   OSA (obstructive sleep apnea) 07/26/2016   Morbid obesity ([place]) 04/14/2016   Hypertension, essential 04/13/2016   Vitamin D deficiency 04/13/2016   Insomnia 04/13/2016   Generalized anxiety disorder 04/13/2016   Major depression 04/13/2016   Osteoarthritis, 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/13/2016   Dyspnea 10/07/2015    Orientation RESPIRATION BLADDER Height & Weight     Self  Normal Incontinent Weight: 73 kg Height:  4' 9"  (144.8 cm)   BEHAVIORAL SYMPTOMS/MOOD NEUROLOGICAL BOWEL NUTRITION STATUS      Incontinent Diet (Heart Healthy)  AMBULATORY STATUS COMMUNICATION OF NEEDS Skin   Limited Assist Verbally Normal                       Personal Care Assistance Level of Assistance  Bathing, Feeding, Dressing Bathing Assistance: Maximum assistance Feeding assistance: Limited assistance Dressing Assistance: Limited assistance     Functional Limitations Info  Sight, Hearing, Speech Sight Info: Adequate Hearing Info: Adequate Speech Info: Adequate    SPECIAL CARE FACTORS FREQUENCY  PT (By licensed PT), OT (By licensed OT)     PT Frequency: 5x weekly OT Frequency: 5x weekly            Contractures Contractures Info: Not present    Additional Factors Info  Code Status, Allergies Code Status Info: full Allergies Info: penicillins            Discharge Medications: acetaminophen 500 MG tablet Commonly known as: TYLENOL Take 1,000 mg by mouth every 6 (six) hours as needed for mild pain.           apixaban 5 MG Tabs tablet Commonly known as: ELIQUIS Take 1 tablet (5 mg total) by mouth 2 (two) times daily. Signed by:Amy Clegg, NP          balsalazide 750 MG capsule Commonly known as: COLAZAL Take 2 capsules (1,500 mg total) by mouth 2 (two) times daily. Signed OH:[id number] D. Zehr, PA-C          Calcium-Vitamin D3 600-200 MG-UNIT Tabs Take 2 tablets by mouth daily.  divalproex 125 MG capsule Commonly known as: DEPAKOTE SPRINKLE Take 125 mg by mouth 2 (two) times daily.          esomeprazole 20 MG capsule Commonly known as: NEXIUM Take 20 mg by mouth daily.          flecainide 100 MG tablet Commonly known as: TAMBOCOR Take 1 tablet (100 mg total) by mouth every 12 (twelve) hours. Signed by:Amy Clegg, NP          LORazepam 0.5 MG tablet Commonly known as: ATIVAN Take 1 tablet (0.5 mg total) by mouth every 12 (twelve) hours as needed for anxiety. Signed UX:NATF Jeannette How, MD          Icon medications to change how you take   Metoprolol Succinate 50 MG Cs24 Take 50 mg by mouth daily. Metoprolol Succinate Capsule ER 24 hour sprinkle 10m. HOLD for HR <60bpm. What changed: additional instructions Another medication with the same name was removed. Continue taking this medication, and follow the directions you see here. Signed bTD:DUKGBJeannette How MD          sertraline 50 MG tablet Commonly known as: ZOLOFT Take 50 mg by mouth daily.         Icon medications to change how you take   traZODone 50 MG tablet Commonly known as: DESYREL Take 50 mg by mouth at bedtime. What changed: Another medication with the same name was removed. Continue taking this medication, and follow the directions you see here.         Icon medications to change how you take   Venlafaxine HCl 75 MG Tb24 Take 75 mg by mouth every other day. What changed: Another medication with the same name was removed. Continue taking this medication, and follow the directions you see here.          vitamin B-12 1000 MCG tablet Commonly known as: CYANOCOBALAMIN Take 1 tablet (1,000 mcg total) by mouth daily. Signed bUR:KYHCWJMartinique MD         Icon medications to change how you take   Vitamin D3 50 MCG (2000 UT) capsule TAKE 1 CAPSULE BY MOUTH EVERY DAY Signed bCB:JSEGBJMartinique MD You were taking this medication differently than prescribed.           Relevant Imaging Results:  Relevant Lab Results:   Additional Information SSN# [id number] [place]

## 2020-08-13 DIAGNOSIS — F331 Major depressive disorder, recurrent, moderate: Secondary | ICD-10-CM | POA: Diagnosis not present

## 2020-08-13 DIAGNOSIS — F064 Anxiety disorder due to known physiological condition: Secondary | ICD-10-CM | POA: Diagnosis not present

## 2020-08-13 DIAGNOSIS — F0391 Unspecified dementia with behavioral disturbance: Secondary | ICD-10-CM | POA: Diagnosis not present

## 2020-08-13 DIAGNOSIS — G4701 Insomnia due to medical condition: Secondary | ICD-10-CM | POA: Diagnosis not present

## 2020-08-17 DIAGNOSIS — I480 Paroxysmal atrial fibrillation: Secondary | ICD-10-CM | POA: Diagnosis not present

## 2020-08-17 DIAGNOSIS — Z87440 Personal history of urinary (tract) infections: Secondary | ICD-10-CM | POA: Diagnosis not present

## 2020-08-17 DIAGNOSIS — L98411 Non-pressure chronic ulcer of buttock limited to breakdown of skin: Secondary | ICD-10-CM | POA: Diagnosis not present

## 2020-08-19 DIAGNOSIS — I272 Pulmonary hypertension, unspecified: Secondary | ICD-10-CM | POA: Diagnosis not present

## 2020-08-19 DIAGNOSIS — K219 Gastro-esophageal reflux disease without esophagitis: Secondary | ICD-10-CM | POA: Diagnosis not present

## 2020-08-19 DIAGNOSIS — G9341 Metabolic encephalopathy: Secondary | ICD-10-CM | POA: Diagnosis not present

## 2020-08-19 DIAGNOSIS — E559 Vitamin D deficiency, unspecified: Secondary | ICD-10-CM | POA: Diagnosis not present

## 2020-08-19 DIAGNOSIS — G4700 Insomnia, unspecified: Secondary | ICD-10-CM | POA: Diagnosis not present

## 2020-08-19 DIAGNOSIS — D631 Anemia in chronic kidney disease: Secondary | ICD-10-CM | POA: Diagnosis not present

## 2020-08-19 DIAGNOSIS — L988 Other specified disorders of the skin and subcutaneous tissue: Secondary | ICD-10-CM | POA: Diagnosis not present

## 2020-08-19 DIAGNOSIS — K5090 Crohn's disease, unspecified, without complications: Secondary | ICD-10-CM | POA: Diagnosis not present

## 2020-08-19 DIAGNOSIS — N390 Urinary tract infection, site not specified: Secondary | ICD-10-CM | POA: Diagnosis not present

## 2020-08-19 DIAGNOSIS — G4733 Obstructive sleep apnea (adult) (pediatric): Secondary | ICD-10-CM | POA: Diagnosis not present

## 2020-08-19 DIAGNOSIS — F411 Generalized anxiety disorder: Secondary | ICD-10-CM | POA: Diagnosis not present

## 2020-08-19 DIAGNOSIS — I480 Paroxysmal atrial fibrillation: Secondary | ICD-10-CM | POA: Diagnosis not present

## 2020-08-19 DIAGNOSIS — F331 Major depressive disorder, recurrent, moderate: Secondary | ICD-10-CM | POA: Diagnosis not present

## 2020-08-19 DIAGNOSIS — F0280 Dementia in other diseases classified elsewhere without behavioral disturbance: Secondary | ICD-10-CM | POA: Diagnosis not present

## 2020-08-19 DIAGNOSIS — Z9181 History of falling: Secondary | ICD-10-CM | POA: Diagnosis not present

## 2020-08-19 DIAGNOSIS — Z6830 Body mass index (BMI) 30.0-30.9, adult: Secondary | ICD-10-CM | POA: Diagnosis not present

## 2020-08-19 DIAGNOSIS — E538 Deficiency of other specified B group vitamins: Secondary | ICD-10-CM | POA: Diagnosis not present

## 2020-08-19 DIAGNOSIS — Z7901 Long term (current) use of anticoagulants: Secondary | ICD-10-CM | POA: Diagnosis not present

## 2020-08-19 DIAGNOSIS — E785 Hyperlipidemia, unspecified: Secondary | ICD-10-CM | POA: Diagnosis not present

## 2020-08-19 DIAGNOSIS — N1832 Chronic kidney disease, stage 3b: Secondary | ICD-10-CM | POA: Diagnosis not present

## 2020-08-19 DIAGNOSIS — I129 Hypertensive chronic kidney disease with stage 1 through stage 4 chronic kidney disease, or unspecified chronic kidney disease: Secondary | ICD-10-CM | POA: Diagnosis not present

## 2020-08-19 DIAGNOSIS — M150 Primary generalized (osteo)arthritis: Secondary | ICD-10-CM | POA: Diagnosis not present

## 2020-08-20 DIAGNOSIS — M6281 Muscle weakness (generalized): Secondary | ICD-10-CM | POA: Diagnosis not present

## 2020-08-20 DIAGNOSIS — R296 Repeated falls: Secondary | ICD-10-CM | POA: Diagnosis not present

## 2020-08-24 DIAGNOSIS — F331 Major depressive disorder, recurrent, moderate: Secondary | ICD-10-CM | POA: Diagnosis not present

## 2020-08-26 ENCOUNTER — Emergency Department (HOSPITAL_COMMUNITY): Payer: PPO

## 2020-08-26 ENCOUNTER — Encounter (HOSPITAL_COMMUNITY): Payer: Self-pay

## 2020-08-26 ENCOUNTER — Emergency Department (HOSPITAL_COMMUNITY)
Admission: EM | Admit: 2020-08-26 | Discharge: 2020-08-26 | Disposition: A | Discharge status: Home / Self Care | Payer: PPO | Attending: Emergency Medicine | Admitting: Emergency Medicine

## 2020-08-26 DIAGNOSIS — S0993XA Unspecified injury of face, initial encounter: Secondary | ICD-10-CM | POA: Diagnosis not present

## 2020-08-26 DIAGNOSIS — W19XXXA Unspecified fall, initial encounter: Secondary | ICD-10-CM | POA: Diagnosis not present

## 2020-08-26 DIAGNOSIS — M25519 Pain in unspecified shoulder: Secondary | ICD-10-CM | POA: Diagnosis not present

## 2020-08-26 DIAGNOSIS — Y9210 Unspecified residential institution as the place of occurrence of the external cause: Secondary | ICD-10-CM | POA: Diagnosis not present

## 2020-08-26 DIAGNOSIS — Y92129 Unspecified place in nursing home as the place of occurrence of the external cause: Secondary | ICD-10-CM

## 2020-08-26 DIAGNOSIS — D649 Anemia, unspecified: Secondary | ICD-10-CM | POA: Insufficient documentation

## 2020-08-26 DIAGNOSIS — M1612 Unilateral primary osteoarthritis, left hip: Secondary | ICD-10-CM | POA: Diagnosis not present

## 2020-08-26 DIAGNOSIS — R5381 Other malaise: Secondary | ICD-10-CM | POA: Diagnosis not present

## 2020-08-26 DIAGNOSIS — Z7401 Bed confinement status: Secondary | ICD-10-CM | POA: Diagnosis not present

## 2020-08-26 DIAGNOSIS — T1490XA Injury, unspecified, initial encounter: Secondary | ICD-10-CM

## 2020-08-26 DIAGNOSIS — R040 Epistaxis: Secondary | ICD-10-CM | POA: Diagnosis not present

## 2020-08-26 DIAGNOSIS — M25511 Pain in right shoulder: Secondary | ICD-10-CM | POA: Diagnosis not present

## 2020-08-26 DIAGNOSIS — Y900 Blood alcohol level of less than 20 mg/100 ml: Secondary | ICD-10-CM | POA: Diagnosis not present

## 2020-08-26 DIAGNOSIS — Z043 Encounter for examination and observation following other accident: Secondary | ICD-10-CM | POA: Diagnosis not present

## 2020-08-26 DIAGNOSIS — R404 Transient alteration of awareness: Secondary | ICD-10-CM | POA: Diagnosis not present

## 2020-08-26 DIAGNOSIS — R4182 Altered mental status, unspecified: Secondary | ICD-10-CM | POA: Diagnosis not present

## 2020-08-26 DIAGNOSIS — R0902 Hypoxemia: Secondary | ICD-10-CM | POA: Diagnosis not present

## 2020-08-26 DIAGNOSIS — R001 Bradycardia, unspecified: Secondary | ICD-10-CM | POA: Diagnosis not present

## 2020-08-26 DIAGNOSIS — S0990XA Unspecified injury of head, initial encounter: Secondary | ICD-10-CM | POA: Diagnosis not present

## 2020-08-26 DIAGNOSIS — I517 Cardiomegaly: Secondary | ICD-10-CM | POA: Diagnosis not present

## 2020-08-26 DIAGNOSIS — J90 Pleural effusion, not elsewhere classified: Secondary | ICD-10-CM | POA: Diagnosis not present

## 2020-08-26 DIAGNOSIS — F0390 Unspecified dementia without behavioral disturbance: Secondary | ICD-10-CM | POA: Insufficient documentation

## 2020-08-26 DIAGNOSIS — I10 Essential (primary) hypertension: Secondary | ICD-10-CM | POA: Diagnosis not present

## 2020-08-26 DIAGNOSIS — S199XXA Unspecified injury of neck, initial encounter: Secondary | ICD-10-CM | POA: Diagnosis not present

## 2020-08-26 HISTORY — DX: Unspecified dementia, unspecified severity, without behavioral disturbance, psychotic disturbance, mood disturbance, and anxiety: F03.90

## 2020-08-26 LAB — I-STAT CHEM 8, ED
BUN: 17 mg/dL (ref 8–23)
Calcium, Ion: 1.1 mmol/L — ABNORMAL LOW (ref 1.15–1.40)
Chloride: 102 mmol/L (ref 98–111)
Creatinine, Ser: 1 mg/dL (ref 0.44–1.00)
Glucose, Bld: 114 mg/dL — ABNORMAL HIGH (ref 70–99)
HCT: 35 % — ABNORMAL LOW (ref 36.0–46.0)
Hemoglobin: 11.9 g/dL — ABNORMAL LOW (ref 12.0–15.0)
Potassium: 4.2 mmol/L (ref 3.5–5.1)
Sodium: 139 mmol/L (ref 135–145)
TCO2: 28 mmol/L (ref 22–32)

## 2020-08-26 LAB — COMPREHENSIVE METABOLIC PANEL
ALT: 7 U/L (ref 0–44)
AST: 16 U/L (ref 15–41)
Albumin: 2.9 g/dL — ABNORMAL LOW (ref 3.5–5.0)
Alkaline Phosphatase: 59 U/L (ref 38–126)
Anion gap: 8 (ref 5–15)
BUN: 15 mg/dL (ref 8–23)
CO2: 25 mmol/L (ref 22–32)
Calcium: 9 mg/dL (ref 8.9–10.3)
Chloride: 103 mmol/L (ref 98–111)
Creatinine, Ser: 1.05 mg/dL — ABNORMAL HIGH (ref 0.44–1.00)
GFR, Estimated: 55 mL/min — ABNORMAL LOW (ref >60)
Glucose, Bld: 115 mg/dL — ABNORMAL HIGH (ref 70–99)
Potassium: 4.2 mmol/L (ref 3.5–5.1)
Sodium: 136 mmol/L (ref 135–145)
Total Bilirubin: 0.4 mg/dL (ref 0.3–1.2)
Total Protein: 6.2 g/dL — ABNORMAL LOW (ref 6.5–8.1)

## 2020-08-26 LAB — CBC
HCT: 35.4 % — ABNORMAL LOW (ref 36.0–46.0)
Hemoglobin: 10.7 g/dL — ABNORMAL LOW (ref 12.0–15.0)
MCH: 25.7 pg — ABNORMAL LOW (ref 26.0–34.0)
MCHC: 30.2 g/dL (ref 30.0–36.0)
MCV: 84.9 fL (ref 80.0–100.0)
Platelets: 208 10*3/uL (ref 150–400)
RBC: 4.17 MIL/uL (ref 3.87–5.11)
RDW: 16.3 % — ABNORMAL HIGH (ref 11.5–15.5)
WBC: 9 10*3/uL (ref 4.0–10.5)
nRBC: 0 % (ref 0.0–0.2)

## 2020-08-26 LAB — SAMPLE TO BLOOD BANK

## 2020-08-26 LAB — LACTIC ACID, PLASMA: Lactic Acid, Venous: 1.3 mmol/L (ref 0.5–1.9)

## 2020-08-26 LAB — PROTIME-INR
INR: 1.4 — ABNORMAL HIGH (ref 0.8–1.2)
Prothrombin Time: 16.7 seconds — ABNORMAL HIGH (ref 11.4–15.2)

## 2020-08-26 LAB — ETHANOL: Alcohol, Ethyl (B): <10 mg/dL (ref <10)

## 2020-08-26 NOTE — ED Notes (Addendum)
Pt comes via [facility] from memory care at [facility], unwitnessed fall, hit head on eliquis,had a nosebleed that has now resolved. Also c/o of R shoulder pain

## 2020-08-26 NOTE — ED Notes (Signed)
Trauma Response Nurse Note-  Reason for Call / Reason for Trauma activation:   -Level 2 trauma, fall on blood thinners.   Initial Focused Assessment (If applicable, or please see trauma documentation):  -Pt came in with dried blood noted to be around and in the nose and right head/hair. No active external bleeding from the face/head noted. No airway obstruction noted and pt noted to symetrical chest rise and fall.   Interventions:  -IV obtained along with blood work, chest and pelvis x-rays completed. Pt taken to CT scan and in CT at 02:24. Pt returned back to room and TRN cleaned the dried blood off the patients face and out of the hair. No active bleeding noted.   Plan of Care as of this note:  -Waiting on imaging results.   Event Summary:   -Pt came in as a level 2 trauma, fall on blood thinners. IV and blood work obtained. Portable x-ray obtained chest and pelvis x-rays. Pt then taken to CT with TRN while pt was on monitor. Pt brought back to room and face and hair cleaned of the dried blood.  The Following (if applicable):    -MD notified: Dr. Roxanne Mins    -TRN arrival Time: TRN at bedside prior to patients arrival

## 2020-08-26 NOTE — ED Provider Notes (Addendum)
[facility] EMERGENCY DEPARTMENT Provider Note   CSN: [id number] Arrival date & time: 08/26/20  0211     History Chief Complaint  Patient presents with   Kristen Gray is a 75 y.o. female.  The history is provided by the patient and the EMS personnel. The history is limited by the condition of the patient (Dementia).  Fall She has history of dementia and is brought in by ambulance as a level 2 trauma because of a fall with some facial injury.  She is on chronic anticoagulation.  EMS reports about 50 mL of blood on scene which apparently was coming from a nosebleed.  Bleeding has stopped.  Per the facility where she resides, her mentation is at its baseline.  She is complaining of pain in the right shoulder, no other complaints.   Past Medical History:  Diagnosis Date   Dementia ([place])     There are no problems to display for this patient.   History reviewed. No pertinent surgical history.   OB History   No obstetric history on file.     No family history on file.     Home Medications Prior to Admission medications   Not on File    Allergies    Patient has no known allergies.  Review of Systems   Review of Systems  Unable to perform ROS: Dementia   Physical Exam Updated Vital Signs BP (!) 148/88 Comment: manual   Pulse (!) 54   Temp (!) 96.1 F (35.6 C)   Resp 15   Ht 5' 4"  (1.626 m)   Wt 63.5 kg   SpO2 100%   BMI 24.03 kg/m   Physical Exam Vitals and nursing note reviewed.  75 year old female, resting comfortably and in no acute distress. Vital signs are significant for mildly elevated blood pressure, slightly slow heart rate. Oxygen saturation is 100%, which is normal. Head is normocephalic.  There is mild soft tissue swelling in the nose with blood present in the left nostril, no active bleeding. PERRLA, EOMI. Oropharynx is clear. Neck is nontender without adenopathy or JVD. Back is nontender and there is no CVA  tenderness. Lungs are clear without rales, wheezes, or rhonchi. Chest is nontender. Heart has regular rate and rhythm without murmur. Abdomen is soft, flat, nontender without masses or hepatosplenomegaly and peristalsis is normoactive. Extremities have no cyanosis or edema, full range of motion is present.  There is mild pain on passive range of motion of the right shoulder but there is no swelling or tenderness to palpation. Skin is warm and dry without rash. Neurologic: Awake and alert, oriented to person and place but not time, cranial nerves are intact, moves all extremities equally.  ED Results / Procedures / Treatments   Labs (all labs ordered are listed, but only abnormal results are displayed) Labs Reviewed  COMPREHENSIVE METABOLIC PANEL - Abnormal; Notable for the following components:      Result Value   Glucose, Bld 115 (*)    Creatinine, Ser 1.05 (*)    Total Protein 6.2 (*)    Albumin 2.9 (*)    GFR, Estimated 55 (*)    All other components within normal limits  CBC - Abnormal; Notable for the following components:   Hemoglobin 10.7 (*)    HCT 35.4 (*)    MCH 25.7 (*)    RDW 16.3 (*)    All other components within normal limits  PROTIME-INR - Abnormal; Notable  for the following components:   Prothrombin Time 16.7 (*)    INR 1.4 (*)    All other components within normal limits  I-STAT CHEM 8, ED - Abnormal; Notable for the following components:   Glucose, Bld 114 (*)    Calcium, Ion 1.10 (*)    Hemoglobin 11.9 (*)    HCT 35.0 (*)    All other components within normal limits  RESP PANEL BY RT-PCR (FLU A&B, COVID) ARPGX2  ETHANOL  LACTIC ACID, PLASMA  URINALYSIS, ROUTINE W REFLEX MICROSCOPIC  SAMPLE TO BLOOD BANK    EKG None  Radiology DG Shoulder Right  Result Date: 08/26/2020 CLINICAL DATA:  Fall EXAM: RIGHT SHOULDER - 2+ VIEW COMPARISON:  None. FINDINGS: Hook shaped osteophyte of the medial right humeral head. No acute fracture or dislocation. IMPRESSION:  No acute fracture or dislocation of the right shoulder. Electronically Signed   By: Ulyses Jarred M.D.   On: 08/26/2020 03:06   CT HEAD WO CONTRAST  Result Date: 08/26/2020 CLINICAL DATA:  Polytrauma, critical, head/C-spine injury suspected; Facial trauma. Chronic anticoagulation. EXAM: CT HEAD WITHOUT CONTRAST CT MAXILLOFACIAL WITHOUT CONTRAST CT CERVICAL SPINE WITHOUT CONTRAST TECHNIQUE: Multidetector CT imaging of the head, cervical spine, and maxillofacial structures were performed using the standard protocol without intravenous contrast. Multiplanar CT image reconstructions of the cervical spine and maxillofacial structures were also generated. COMPARISON:  None. FINDINGS: CT HEAD FINDINGS Brain: Normal anatomic configuration. Parenchymal volume loss is commensurate with the patient's age. Mild periventricular white matter changes are present likely reflecting the sequela of small vessel ischemia. 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 No abnormal mass effect or midline shift. No evidence of acute intracranial hemorrhage or infarct. Ventricular size is normal. Cerebellum unremarkable. Vascular: No asymmetric hyperdense vasculature at the skull base. Skull: Intact Other: Mastoid air cells and middle ear cavities are clear. CT MAXILLOFACIAL FINDINGS Osseous: No fracture or mandibular dislocation. No destructive process. Orbits: Negative. No traumatic or inflammatory finding. Sinuses: Clear. Soft tissues: Negative. CT CERVICAL SPINE FINDINGS Alignment: 1-2 mm anterolisthesis of C4 upon C5 is likely degenerative in nature. Normal cervical lordosis. Skull base and vertebrae: The craniocervical alignment is normal. Atlantal dental interval is not widened. No acute fracture of the cervical spine. Vertebral body height has been preserved. Soft tissues and spinal canal: Moderate central posterior disc herniation at C3-4 abuts the  thecal sac without remodeling. Milder posterior disc herniation noted at C2-3. Posterior disc osteophyte complex at C5-6 at C6-7 mildly narrows the spinal canal with mild flattening of the thecal sac. The AP diameter of the spinal canal in this region is, at minimum 6-7 mm in AP diameter. No canal hematoma. The prevertebral soft tissues are not thickened. Nose paraspinal fluid collection identified. Disc levels: There is intervertebral disc space narrowing and endplate remodeling at Q2-W9 in keeping with changes of moderate to severe degenerative disc disease. Degenerative changes are noted at C3-4 and C4-5 with disc osteophytes and posterior disc herniations, as described above. The prevertebral soft tissues are not thickened on sagittal reformats. Review of the axial images demonstrates multilevel uncovertebral arthrosis resulting in moderate bilateral neuroforaminal narrowing on the left at C5-6 and bilaterally at C6-7. Milder neuroforaminal narrowing is noted bilaterally at C4-5 and on the right at C5-6. Mild neuroforaminal narrowing is also noted on the right at C3-4. Upper chest: Unremarkable Other: None IMPRESSION: No acute intracranial abnormality.  No calvarial fracture. No acute facial  fracture. No acute fracture or listhesis of the cervical spine. Electronically Signed   By: Fidela Salisbury MD   On: 08/26/2020 03:02   CT CERVICAL SPINE WO CONTRAST  Result Date: 08/26/2020 CLINICAL DATA:  Polytrauma, critical, head/C-spine injury suspected; Facial trauma. Chronic anticoagulation. EXAM: CT HEAD WITHOUT CONTRAST CT MAXILLOFACIAL WITHOUT CONTRAST CT CERVICAL SPINE WITHOUT CONTRAST TECHNIQUE: Multidetector CT imaging of the head, cervical spine, and maxillofacial structures were performed using the standard protocol without intravenous contrast. Multiplanar CT image reconstructions of the cervical spine and maxillofacial structures were also generated. COMPARISON:  None. FINDINGS: CT HEAD FINDINGS Brain:  Normal anatomic configuration. Parenchymal volume loss is commensurate with the patient's age. Mild periventricular white matter changes are present likely reflecting the sequela of small vessel ischemia. 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 No abnormal mass effect or midline shift. No evidence of acute intracranial hemorrhage or infarct. Ventricular size is normal. Cerebellum unremarkable. Vascular: No asymmetric hyperdense vasculature at the skull base. Skull: Intact Other: Mastoid air cells and middle ear cavities are clear. CT MAXILLOFACIAL FINDINGS Osseous: No fracture or mandibular dislocation. No destructive process. Orbits: Negative. No traumatic or inflammatory finding. Sinuses: Clear. Soft tissues: Negative. CT CERVICAL SPINE FINDINGS Alignment: 1-2 mm anterolisthesis of C4 upon C5 is likely degenerative in nature. Normal cervical lordosis. Skull base and vertebrae: The craniocervical alignment is normal. Atlantal dental interval is not widened. No acute fracture of the cervical spine. Vertebral body height has been preserved. Soft tissues and spinal canal: Moderate central posterior disc herniation at C3-4 abuts the thecal sac without remodeling. Milder posterior disc herniation noted at C2-3. Posterior disc osteophyte complex at C5-6 at C6-7 mildly narrows the spinal canal with mild flattening of the thecal sac. The AP diameter of the spinal canal in this region is, at minimum 6-7 mm in AP diameter. No canal hematoma. The prevertebral soft tissues are not thickened. Nose paraspinal fluid collection identified. Disc levels: There is intervertebral disc space narrowing and endplate remodeling at U7-M5 in keeping with changes of moderate to severe degenerative disc disease. Degenerative changes are noted at C3-4 and C4-5 with disc osteophytes and posterior disc herniations, as described above. The prevertebral soft  tissues are not thickened on sagittal reformats. Review of the axial images demonstrates multilevel uncovertebral arthrosis resulting in moderate bilateral neuroforaminal narrowing on the left at C5-6 and bilaterally at C6-7. Milder neuroforaminal narrowing is noted bilaterally at C4-5 and on the right at C5-6. Mild neuroforaminal narrowing is also noted on the right at C3-4. Upper chest: Unremarkable Other: None IMPRESSION: No acute intracranial abnormality.  No calvarial fracture. No acute facial fracture. No acute fracture or listhesis of the cervical spine. Electronically Signed   By: Fidela Salisbury MD   On: 08/26/2020 03:02   DG Pelvis Portable  Result Date: 08/26/2020 CLINICAL DATA:  Unwitnessed fall EXAM: PORTABLE PELVIS 1-2 VIEWS COMPARISON:  None. FINDINGS: Normal alignment. No fracture or dislocation. Severe right hip degenerative arthritis. Mild left hip degenerative arthritis. Soft tissues are unremarkable save for laparotomy suture. IMPRESSION: No acute fracture or dislocation. Electronically Signed   By: Fidela Salisbury MD   On: 08/26/2020 02:29   DG Chest Port 1 View  Result Date: 08/26/2020 CLINICAL DATA:  Unwitnessed fall, right shoulder pain EXAM: PORTABLE CHEST 1 VIEW COMPARISON:  None FINDINGS: The lungs are symmetrically expanded. Trace bilateral perihilar interstitial pulmonary infiltrate most in keeping with perihilar pulmonary edema. Cardiac  size mildly enlarged. Small right pleural effusion. No pneumothorax. No acute bone abnormality. IMPRESSION: Mild cardiomegaly. Trace perihilar pulmonary edema and small right pleural effusion, possibly cardiogenic in nature. Electronically Signed   By: Fidela Salisbury MD   On: 08/26/2020 02:29   CT Maxillofacial Wo Contrast  Result Date: 08/26/2020 CLINICAL DATA:  Polytrauma, critical, head/C-spine injury suspected; Facial trauma. Chronic anticoagulation. EXAM: CT HEAD WITHOUT CONTRAST CT MAXILLOFACIAL WITHOUT CONTRAST CT CERVICAL SPINE WITHOUT  CONTRAST TECHNIQUE: Multidetector CT imaging of the head, cervical spine, and maxillofacial structures were performed using the standard protocol without intravenous contrast. Multiplanar CT image reconstructions of the cervical spine and maxillofacial structures were also generated. COMPARISON:  None. FINDINGS: CT HEAD FINDINGS Brain: Normal anatomic configuration. Parenchymal volume loss is commensurate with the patient's age. Mild periventricular white matter changes are present likely reflecting the sequela of small vessel ischemia. 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 No abnormal mass effect or midline shift. No evidence of acute intracranial hemorrhage or infarct. Ventricular size is normal. Cerebellum unremarkable. Vascular: No asymmetric hyperdense vasculature at the skull base. Skull: Intact Other: Mastoid air cells and middle ear cavities are clear. CT MAXILLOFACIAL FINDINGS Osseous: No fracture or mandibular dislocation. No destructive process. Orbits: Negative. No traumatic or inflammatory finding. Sinuses: Clear. Soft tissues: Negative. CT CERVICAL SPINE FINDINGS Alignment: 1-2 mm anterolisthesis of C4 upon C5 is likely degenerative in nature. Normal cervical lordosis. Skull base and vertebrae: The craniocervical alignment is normal. Atlantal dental interval is not widened. No acute fracture of the cervical spine. Vertebral body height has been preserved. Soft tissues and spinal canal: Moderate central posterior disc herniation at C3-4 abuts the thecal sac without remodeling. Milder posterior disc herniation noted at C2-3. Posterior disc osteophyte complex at C5-6 at C6-7 mildly narrows the spinal canal with mild flattening of the thecal sac. The AP diameter of the spinal canal in this region is, at minimum 6-7 mm in AP diameter. No canal hematoma. The prevertebral soft tissues are not thickened. Nose paraspinal  fluid collection identified. Disc levels: There is intervertebral disc space narrowing and endplate remodeling at O1-Y0 in keeping with changes of moderate to severe degenerative disc disease. Degenerative changes are noted at C3-4 and C4-5 with disc osteophytes and posterior disc herniations, as described above. The prevertebral soft tissues are not thickened on sagittal reformats. Review of the axial images demonstrates multilevel uncovertebral arthrosis resulting in moderate bilateral neuroforaminal narrowing on the left at C5-6 and bilaterally at C6-7. Milder neuroforaminal narrowing is noted bilaterally at C4-5 and on the right at C5-6. Mild neuroforaminal narrowing is also noted on the right at C3-4. Upper chest: Unremarkable Other: None IMPRESSION: No acute intracranial abnormality.  No calvarial fracture. No acute facial fracture. No acute fracture or listhesis of the cervical spine. Electronically Signed   By: Fidela Salisbury MD   On: 08/26/2020 03:02    Procedures Procedures  CRITICAL CARE Performed by: Delora Fuel Total critical care time: 35 minutes Critical care time was exclusive of separately billable procedures and treating other patients. Critical care was necessary to treat or prevent imminent or life-threatening deterioration. Critical care was time spent personally by me on the following activities: development of treatment plan with patient and/or surrogate as well as nursing, discussions with consultants, evaluation of patient's response to treatment, examination of patient, obtaining history from patient or surrogate, ordering and performing treatments and interventions, ordering and review of laboratory  studies, ordering and review of radiographic studies, pulse oximetry and re-evaluation of patient's condition.  Medications Ordered in ED Medications - No data to display  ED Course  I have reviewed the triage vital signs and the nursing notes.  Pertinent imaging results that  were available during my care of the patient were reviewed by me and considered in my medical decision making (see chart for details).   MDM Rules/Calculators/A&P                         Fall with facial trauma in patient who is anticoagulated.  She is being sent for CT of head, maxillofacial bones, cervical spine.  Apparently minor injury to right shoulder, x-rays were ordered.  She has no prior records in the [facility] health system.  CT scans show no acute injury.  Shoulder x-ray also shows no acute injury.  Labs show mild anemia with no prior to compare.  There has been no further bleeding.  She is discharged back to her skilled nursing center.  Final Clinical Impression(s) / ED Diagnoses Final diagnoses:  Fall at nursing home, initial encounter  Epistaxis due to trauma  Normochromic normocytic anemia    Rx / DC Orders ED Discharge Orders     None        Delora Fuel, MD 34/03/52 [id number]    Delora Fuel, MD 59/09/31 [phone]

## 2020-08-26 NOTE — ED Notes (Signed)
Patient transported to CT 

## 2020-08-26 NOTE — ED Notes (Signed)
PTAR called  

## 2020-08-30 ENCOUNTER — Other Ambulatory Visit: Payer: Self-pay

## 2020-08-30 ENCOUNTER — Emergency Department (HOSPITAL_COMMUNITY): Payer: PPO

## 2020-08-30 ENCOUNTER — Emergency Department (HOSPITAL_COMMUNITY)
Admission: EM | Admit: 2020-08-30 | Discharge: 2020-08-30 | Disposition: A | Discharge status: Home / Self Care | Payer: PPO | Attending: Emergency Medicine | Admitting: Emergency Medicine

## 2020-08-30 ENCOUNTER — Encounter (HOSPITAL_COMMUNITY): Payer: Self-pay | Admitting: *Deleted

## 2020-08-30 DIAGNOSIS — Y92002 Bathroom of unspecified non-institutional (private) residence single-family (private) house as the place of occurrence of the external cause: Secondary | ICD-10-CM | POA: Diagnosis not present

## 2020-08-30 DIAGNOSIS — Z743 Need for continuous supervision: Secondary | ICD-10-CM | POA: Diagnosis not present

## 2020-08-30 DIAGNOSIS — Z7901 Long term (current) use of anticoagulants: Secondary | ICD-10-CM | POA: Insufficient documentation

## 2020-08-30 DIAGNOSIS — Z043 Encounter for examination and observation following other accident: Secondary | ICD-10-CM | POA: Diagnosis not present

## 2020-08-30 DIAGNOSIS — R0902 Hypoxemia: Secondary | ICD-10-CM | POA: Diagnosis not present

## 2020-08-30 DIAGNOSIS — Z79899 Other long term (current) drug therapy: Secondary | ICD-10-CM | POA: Insufficient documentation

## 2020-08-30 DIAGNOSIS — F0390 Unspecified dementia without behavioral disturbance: Secondary | ICD-10-CM | POA: Insufficient documentation

## 2020-08-30 DIAGNOSIS — M160 Bilateral primary osteoarthritis of hip: Secondary | ICD-10-CM | POA: Diagnosis not present

## 2020-08-30 DIAGNOSIS — R5383 Other fatigue: Secondary | ICD-10-CM | POA: Diagnosis not present

## 2020-08-30 DIAGNOSIS — I10 Essential (primary) hypertension: Secondary | ICD-10-CM | POA: Insufficient documentation

## 2020-08-30 DIAGNOSIS — I517 Cardiomegaly: Secondary | ICD-10-CM | POA: Diagnosis not present

## 2020-08-30 DIAGNOSIS — S00531A Contusion of lip, initial encounter: Secondary | ICD-10-CM | POA: Diagnosis not present

## 2020-08-30 DIAGNOSIS — I959 Hypotension, unspecified: Secondary | ICD-10-CM | POA: Diagnosis not present

## 2020-08-30 DIAGNOSIS — R9082 White matter disease, unspecified: Secondary | ICD-10-CM | POA: Diagnosis not present

## 2020-08-30 DIAGNOSIS — Z8679 Personal history of other diseases of the circulatory system: Secondary | ICD-10-CM | POA: Diagnosis not present

## 2020-08-30 DIAGNOSIS — W19XXXA Unspecified fall, initial encounter: Secondary | ICD-10-CM

## 2020-08-30 DIAGNOSIS — T1490XA Injury, unspecified, initial encounter: Secondary | ICD-10-CM | POA: Diagnosis present

## 2020-08-30 DIAGNOSIS — R4182 Altered mental status, unspecified: Secondary | ICD-10-CM | POA: Diagnosis not present

## 2020-08-30 DIAGNOSIS — M2578 Osteophyte, vertebrae: Secondary | ICD-10-CM | POA: Diagnosis not present

## 2020-08-30 DIAGNOSIS — R531 Weakness: Secondary | ICD-10-CM | POA: Diagnosis not present

## 2020-08-30 IMAGING — CT CT HEAD W/O CM
4 series · 16 of 47 positions shown, 18 images · non-contrast
Comparison: [DATE]

CLINICAL DATA: Unwitnessed fall, blood thinners

EXAM:
CT HEAD WITHOUT CONTRAST
CT CERVICAL SPINE WITHOUT CONTRAST
TECHNIQUE: Multidetector CT imaging of the head and cervical spine was
performed following the standard protocol without intravenous
contrast. Multiplanar CT image reconstructions of the cervical spine
were also generated.

[Series 1: head without · axial · non-contrast · 0.43mm/px · z∈[-2,+108]mm · 7 of 30 slices shown, 9 images]
[im 4/30  brain]
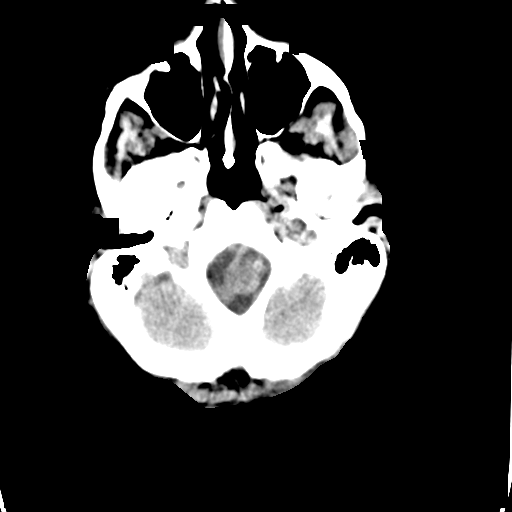
[im 4/30  bone]
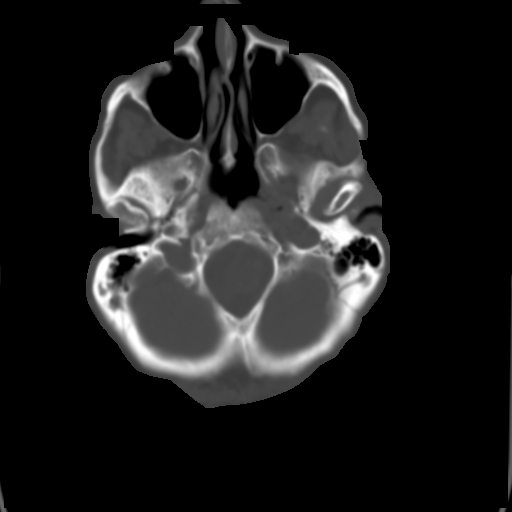
[im 8/30  brain]
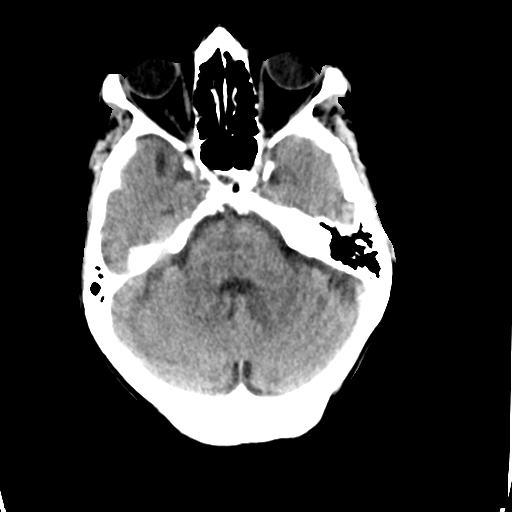
[im 11/30  brain]
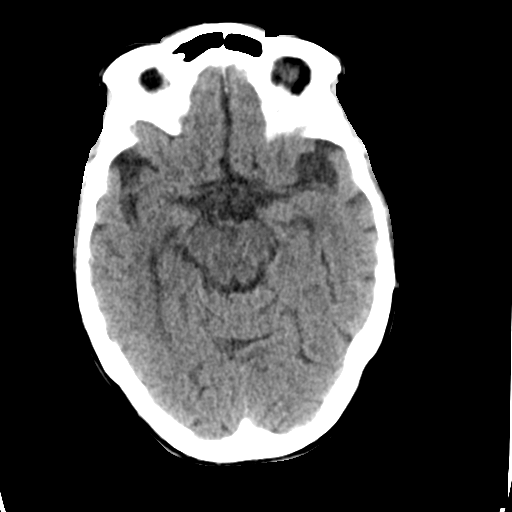
[im 15/30  brain]
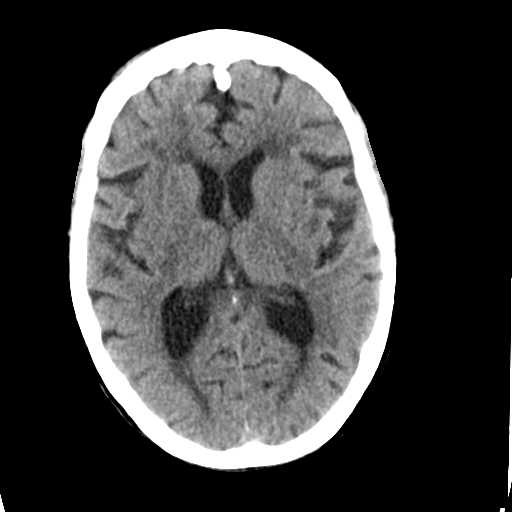
[im 19/30  brain]
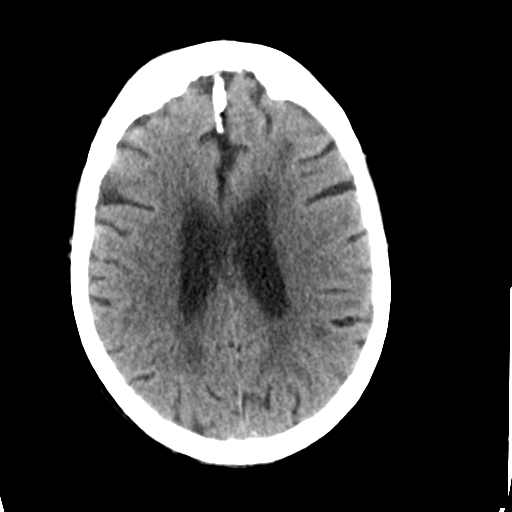
[im 19/30  bone]
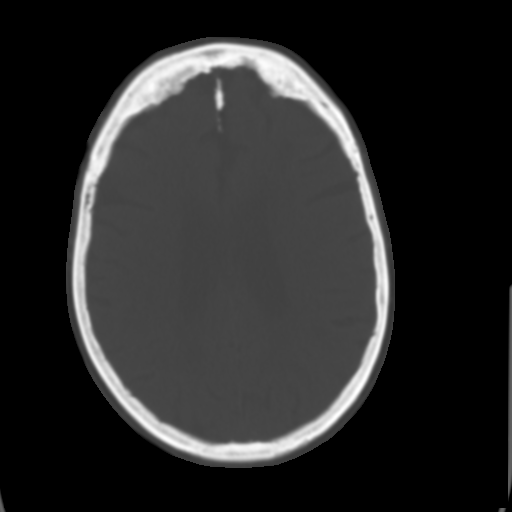
[im 22/30  brain]
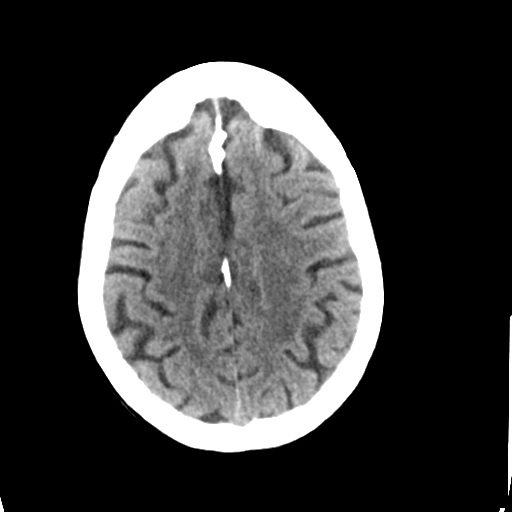
[im 26/30  brain]
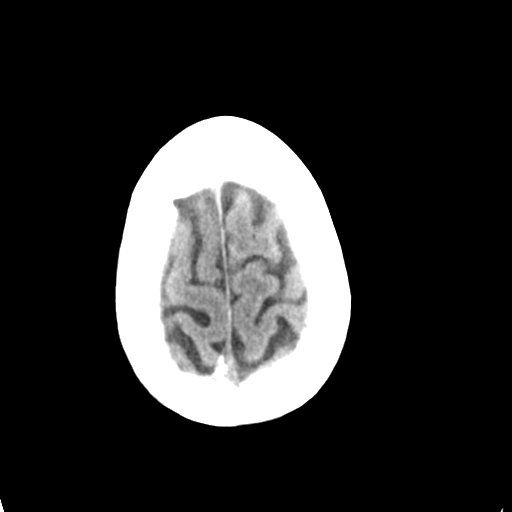

[Series 4: head bone · axial · 0.43mm/px · z∈[-3,+27]mm · 3 of 75 slices shown]
[im 8/75  bone]
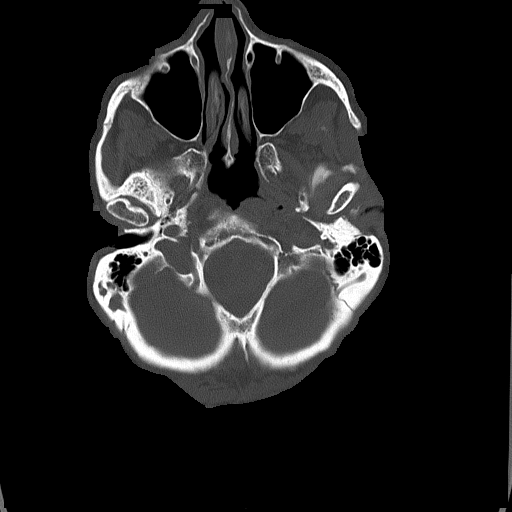
[im 15/75  bone]
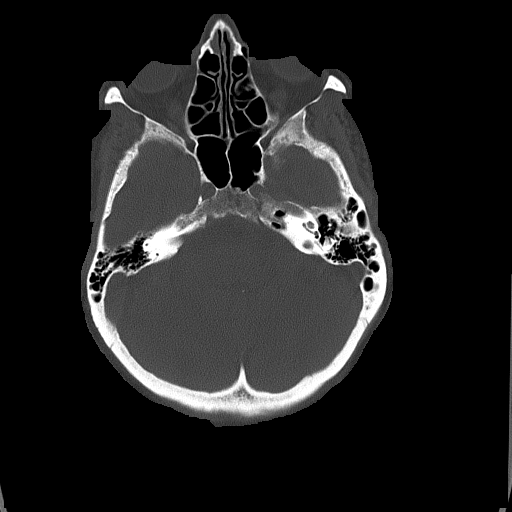
[im 23/75  bone]
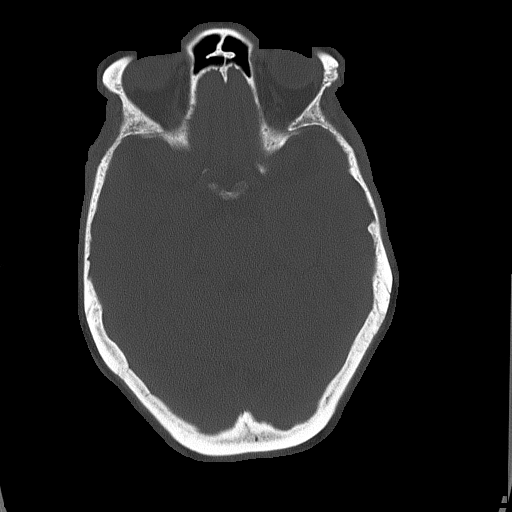

[Series 5: head without cor · coronal · non-contrast · 0.31mm/px · 3 of 66 slices shown]
[im 22/66  brain]
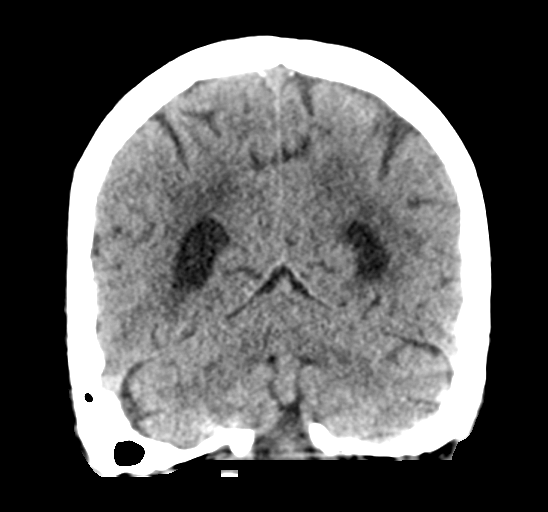
[im 29/66  brain]
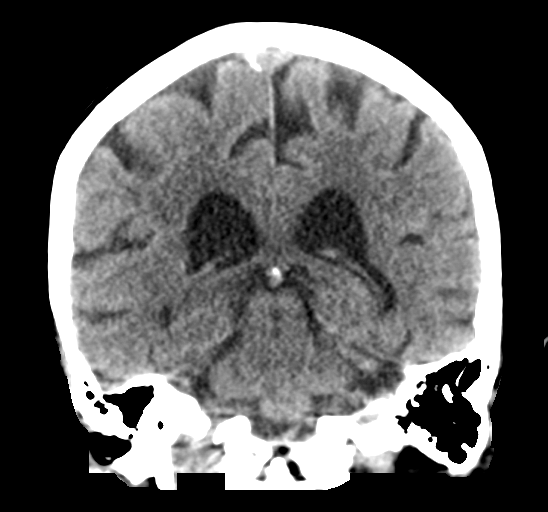
[im 37/66  brain]
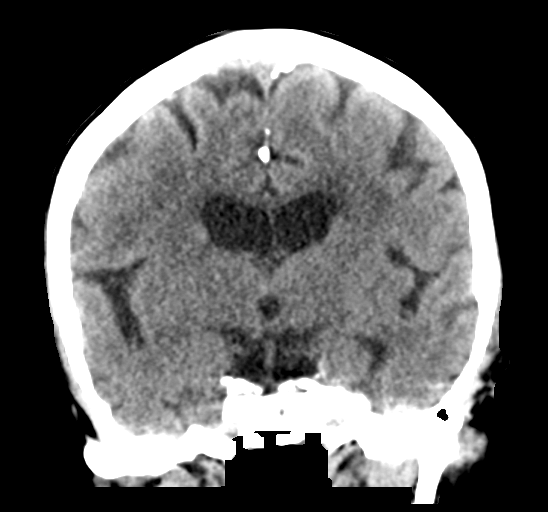

[Series 6: head without sag · sagittal · non-contrast · 0.31mm/px · 3 of 49 slices shown]
[im 17/49  brain]
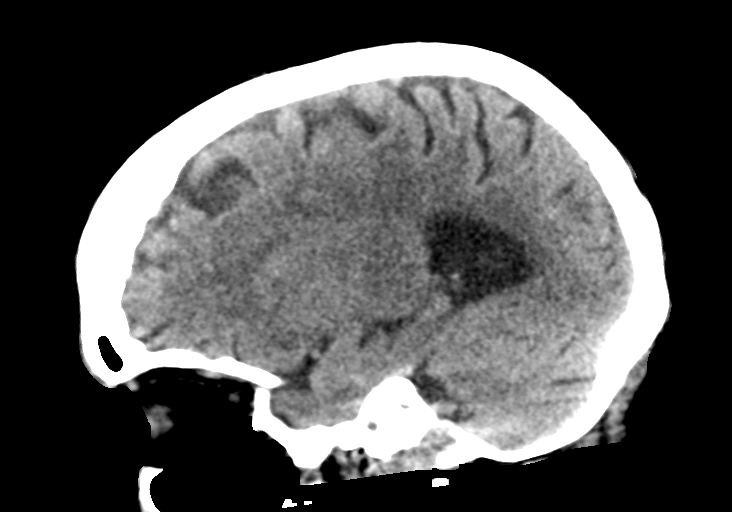
[im 25/49  brain]
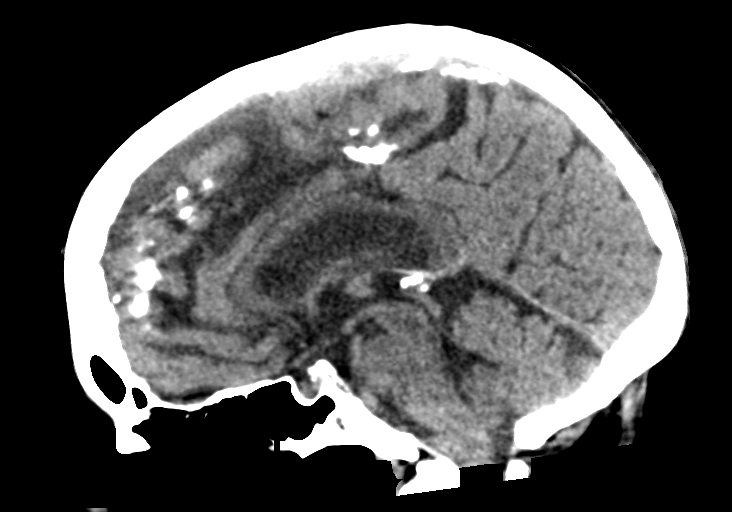
[im 33/49  brain]
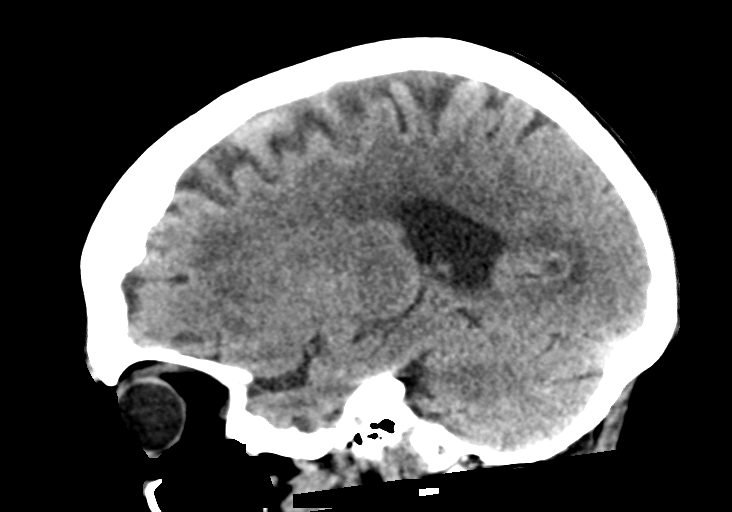

[16 of 47 positions shown; findings below may reference images not displayed]

FINDINGS: CT HEAD FINDINGS

Brain: No evidence of acute infarction, hemorrhage, hydrocephalus,
extra-axial collection or mass lesion/mass effect. Periventricular
and deep white matter hypodensity.

Vascular: No hyperdense vessel or unexpected calcification.

Skull: Normal. Negative for fracture or focal lesion.

Sinuses/Orbits: No acute finding.

Other: None.

CT CERVICAL SPINE FINDINGS

Alignment: Normal.

Skull base and vertebrae: No acute fracture. No primary bone lesion
or focal pathologic process.

Soft tissues and spinal canal: No prevertebral fluid or swelling. No
visible canal hematoma.

Disc levels: Moderate disc space height loss and osteophytosis of
C5-C7. Disc spaces are otherwise relatively preserved.

Upper chest: Negative.

Other: None.
IMPRESSION: 1. No acute intracranial pathology. Small-vessel white matter
disease.
2. No fracture or static subluxation of the cervical spine.
3. Moderate disc space height loss and osteophytosis of C5-C7.

## 2020-08-30 IMAGING — CT CT CERVICAL SPINE W/O CM
3 of 4 series · 13 of 33 positions shown, 16 images · non-contrast
Comparison: [DATE]

CLINICAL DATA: Unwitnessed fall, blood thinners

EXAM:
CT HEAD WITHOUT CONTRAST
CT CERVICAL SPINE WITHOUT CONTRAST
TECHNIQUE: Multidetector CT imaging of the head and cervical spine was
performed following the standard protocol without intravenous
contrast. Multiplanar CT image reconstructions of the cervical spine
were also generated.

[Series 4: c_spine 2.0 orthogonals · axial · 0.32mm/px · z∈[-167,-60]mm · 5 of 83 slices shown, 7 images]
[im 14/83  soft-tissue]
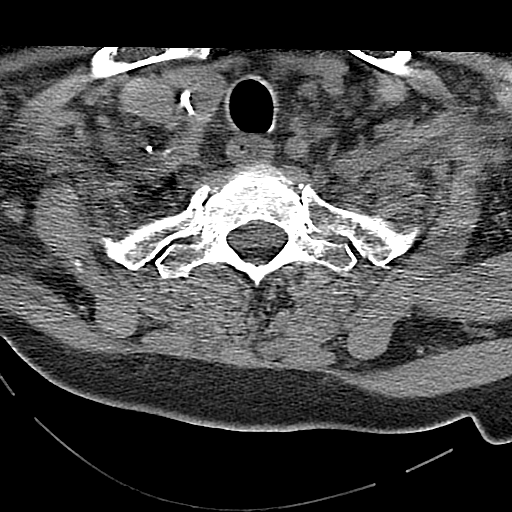
[im 14/83  bone]
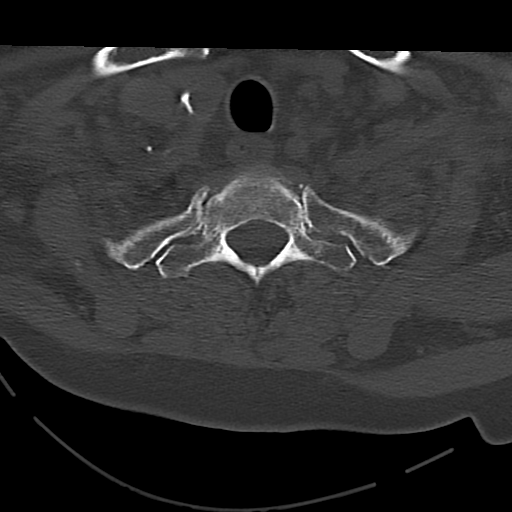
[im 28/83  bone]
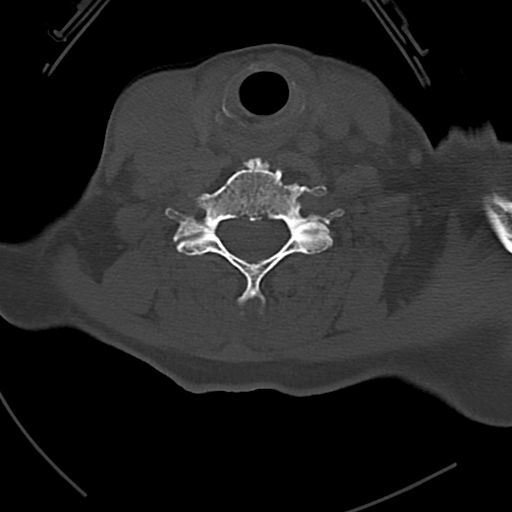
[im 42/83  bone]
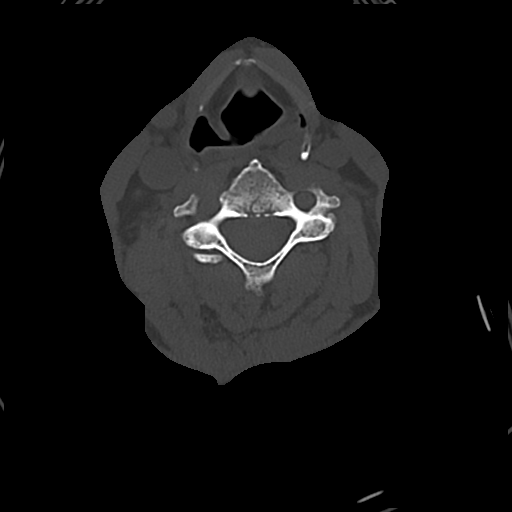
[im 55/83  bone]
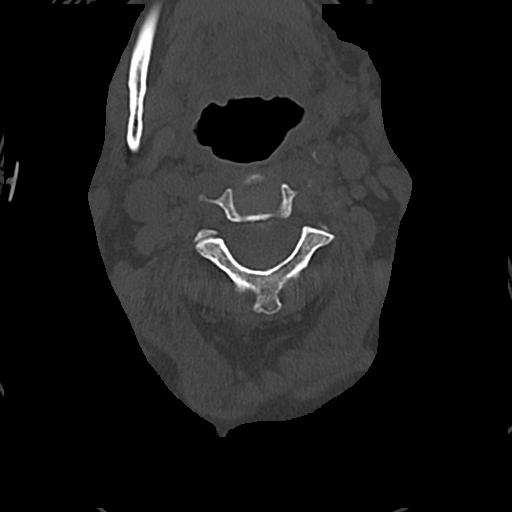
[im 69/83  soft-tissue]
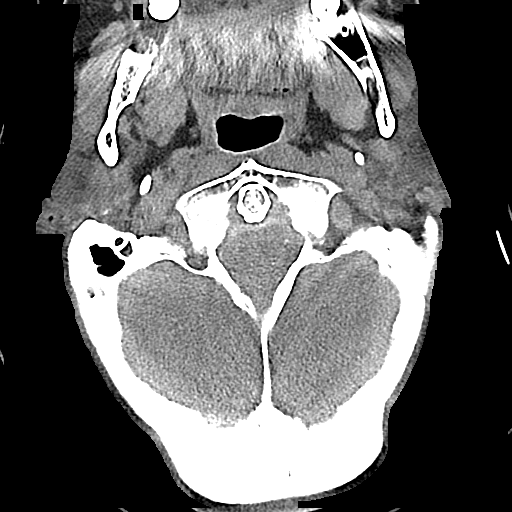
[im 69/83  bone]
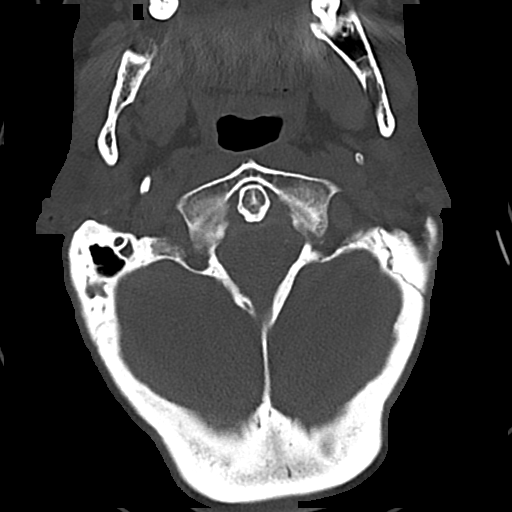

[Series 7: c_spine 2.0 sag bone · sagittal · 0.32mm/px · 5 of 60 slices shown, 6 images]
[im 20/60  bone]
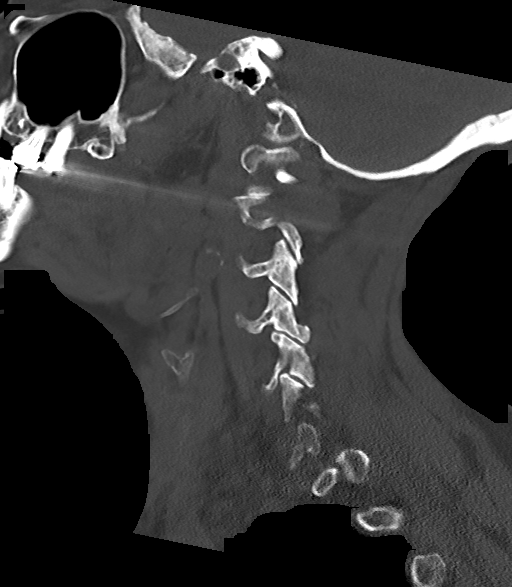
[im 25/60  bone]
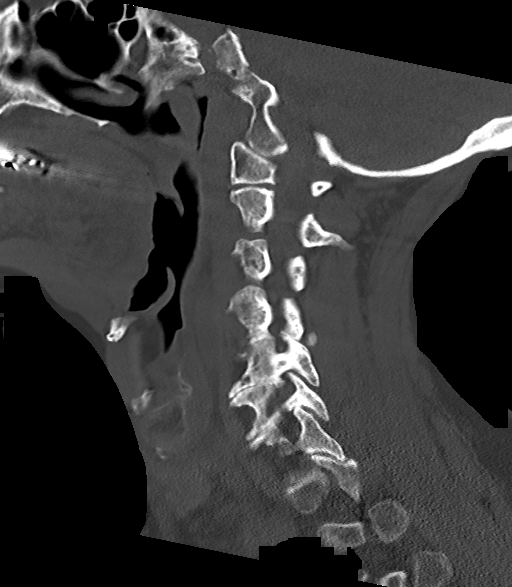
[im 30/60  soft-tissue]
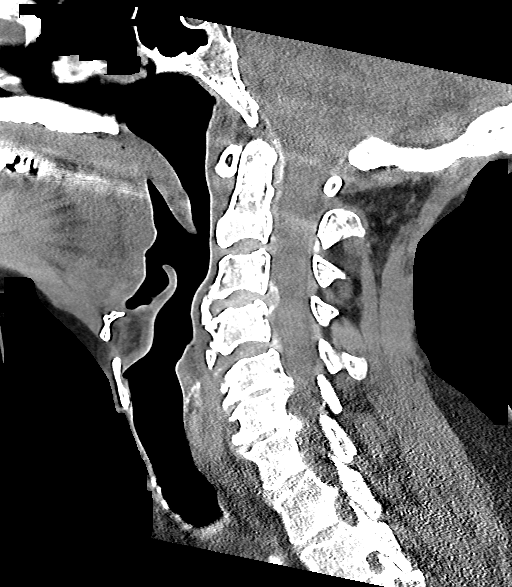
[im 30/60  bone]
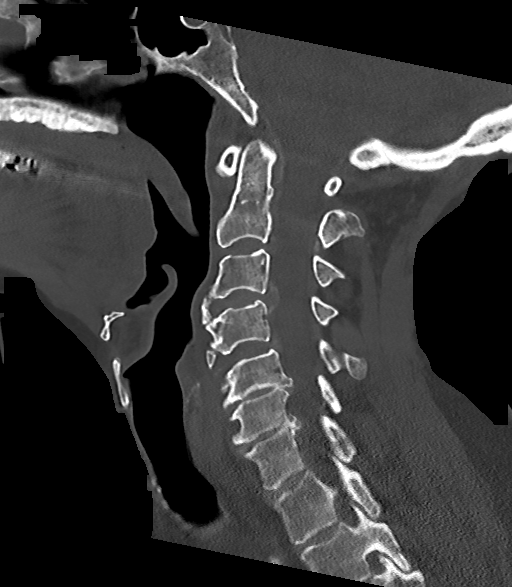
[im 35/60  bone]
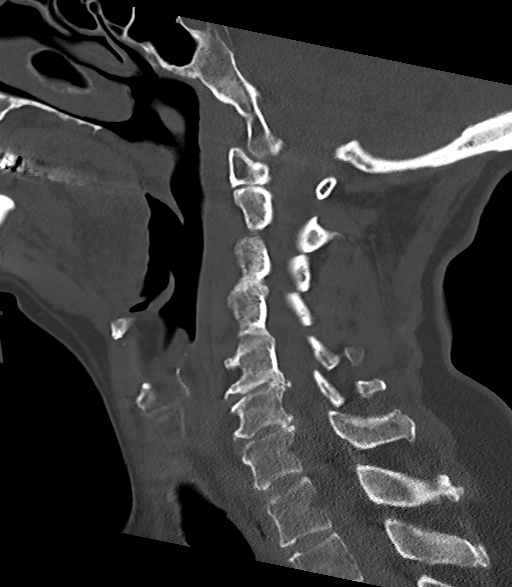
[im 40/60  bone]
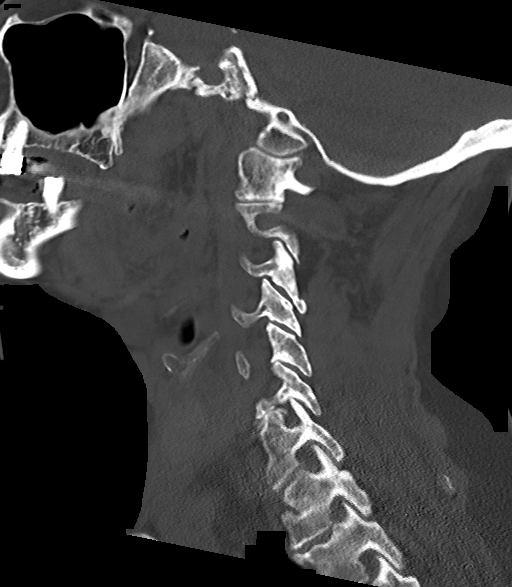

[Series 8: c_spine 2.0 cor bone · coronal · 0.24mm/px · 3 of 66 slices shown]
[im 14/66  bone]
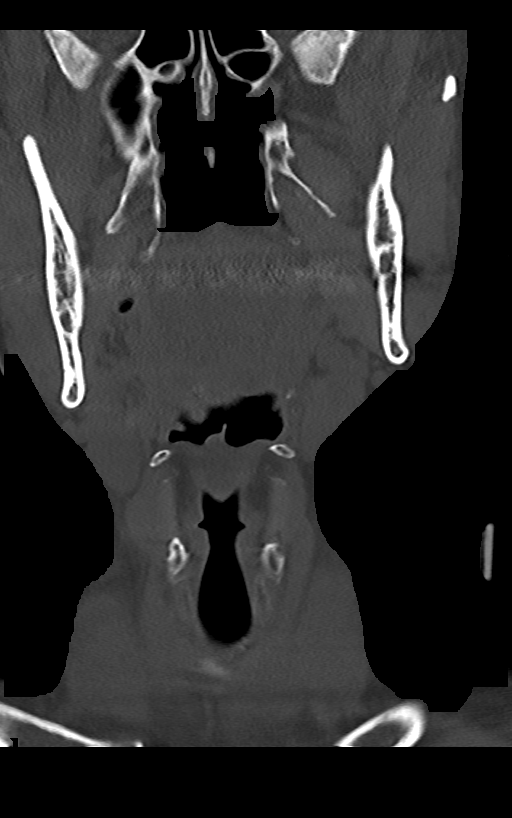
[im 27/66  bone]
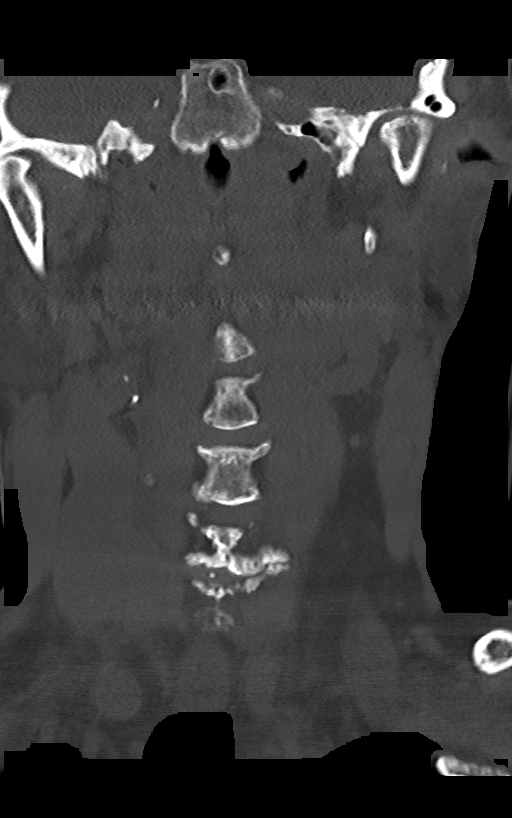
[im 40/66  bone]
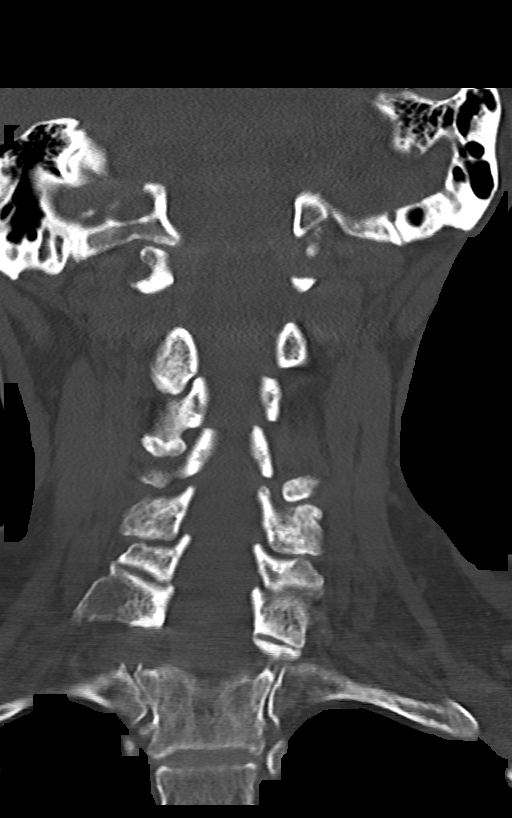

[13 of 33 positions shown; findings below may reference images not displayed]

FINDINGS: CT HEAD FINDINGS

Brain: No evidence of acute infarction, hemorrhage, hydrocephalus,
extra-axial collection or mass lesion/mass effect. Periventricular
and deep white matter hypodensity.

Vascular: No hyperdense vessel or unexpected calcification.

Skull: Normal. Negative for fracture or focal lesion.

Sinuses/Orbits: No acute finding.

Other: None.

CT CERVICAL SPINE FINDINGS

Alignment: Normal.

Skull base and vertebrae: No acute fracture. No primary bone lesion
or focal pathologic process.

Soft tissues and spinal canal: No prevertebral fluid or swelling. No
visible canal hematoma.

Disc levels: Moderate disc space height loss and osteophytosis of
C5-C7. Disc spaces are otherwise relatively preserved.

Upper chest: Negative.

Other: None.
IMPRESSION: 1. No acute intracranial pathology. Small-vessel white matter
disease.
2. No fracture or static subluxation of the cervical spine.
3. Moderate disc space height loss and osteophytosis of C5-C7.

## 2020-08-30 IMAGING — DX DG PORTABLE PELVIS
1 series · 1 of 1 positions shown · non-contrast
Comparison: [DATE] (without report.

CLINICAL DATA: fall

EXAM:
PORTABLE PELVIS 1-2 VIEWS

[pelvis]
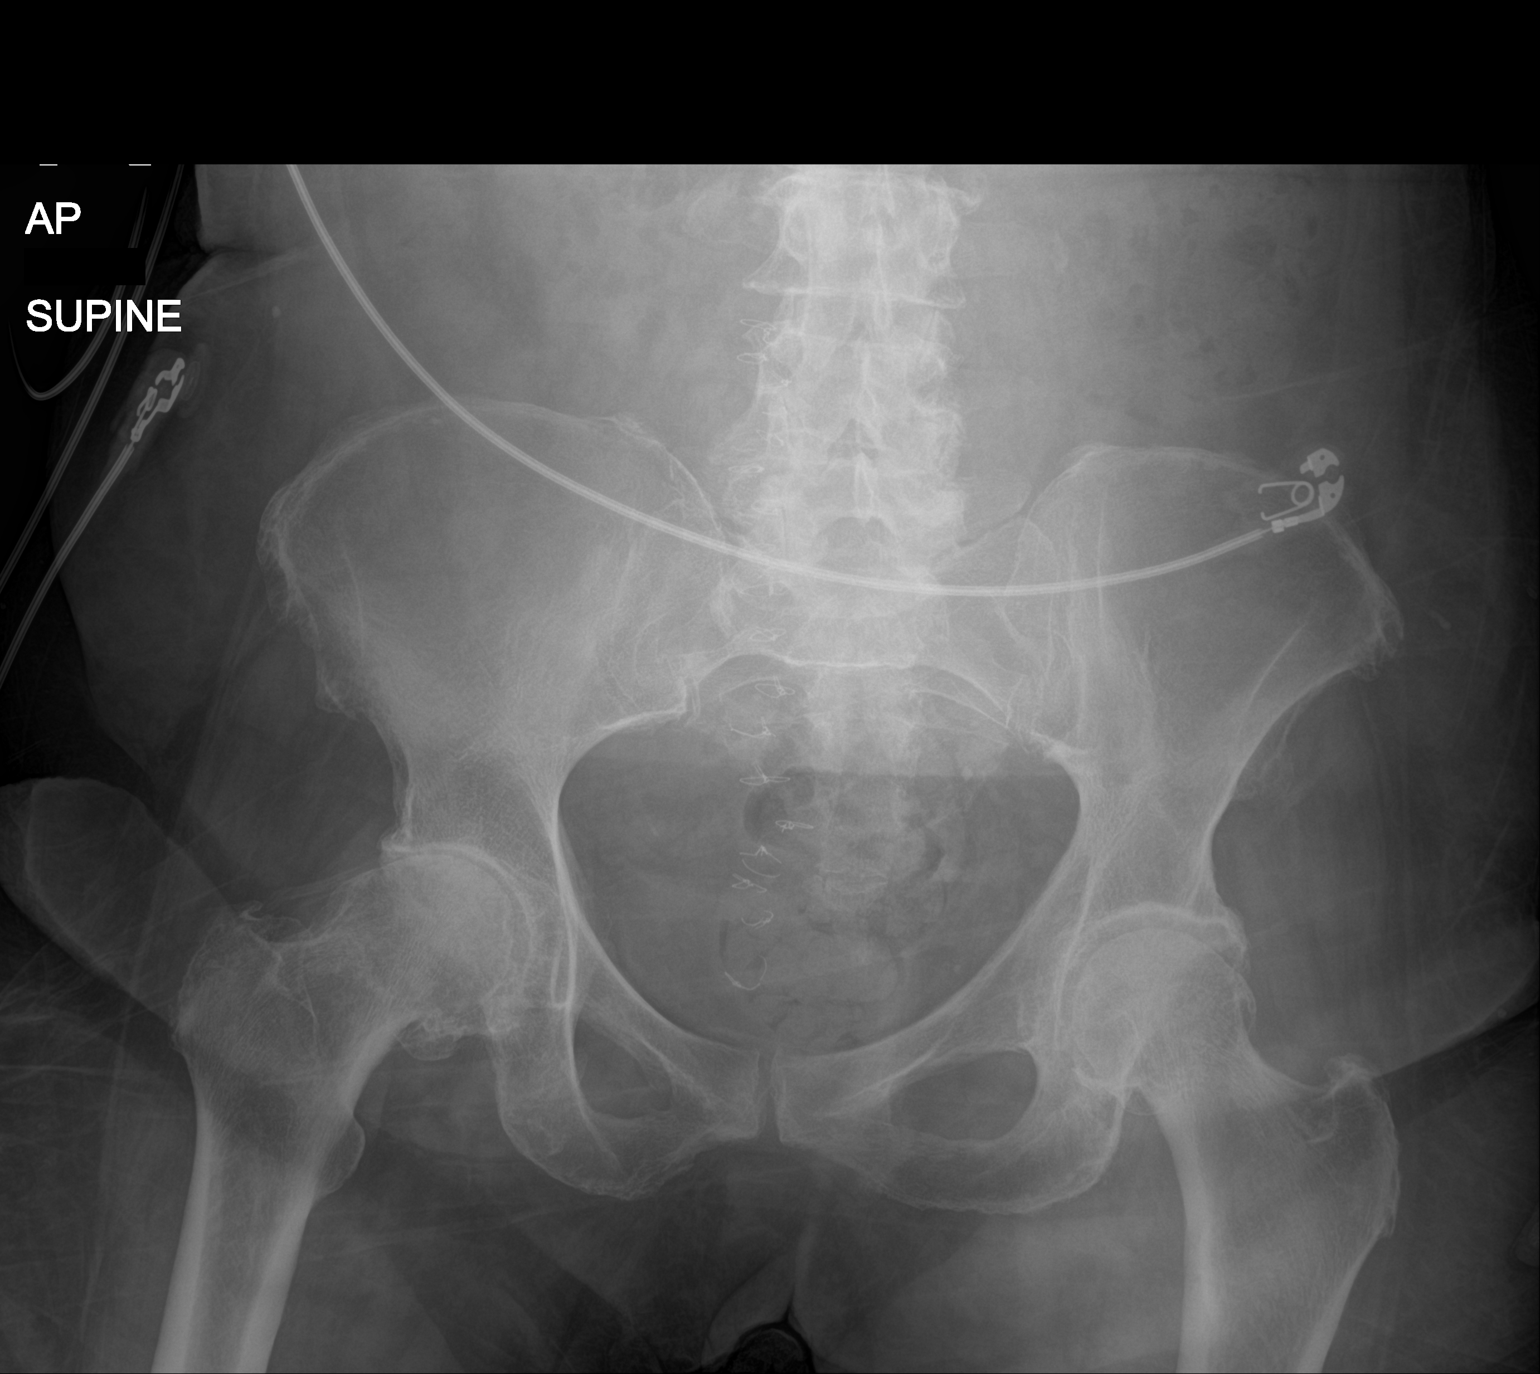

[1 of 1 positions shown; findings below may reference images not displayed]

FINDINGS: There is no evidence of pelvic fracture or diastasis. No hip
dislocation. Severe right and mild-to-moderate left hip degenerative
change. Pelvic enthesopathy. Lower lumbar degenerative change.
Rounded calcification in the imaged left abdomen. Midline sutures.
IMPRESSION: 1. No evidence of acute fracture.
2. Severe right and mild to moderate left hip degenerative change.
3. Rounded calcification in the imaged left abdomen, nonspecific but
potentially a renal calculus versus hyperdense material in the fecal
stream.

## 2020-08-30 IMAGING — DX DG CHEST 1V PORT
1 series · 1 of 1 positions shown · non-contrast
Comparison: [DATE]

CLINICAL DATA: Fall, blood thinners

EXAM:
PORTABLE CHEST 1 VIEW

[chest]
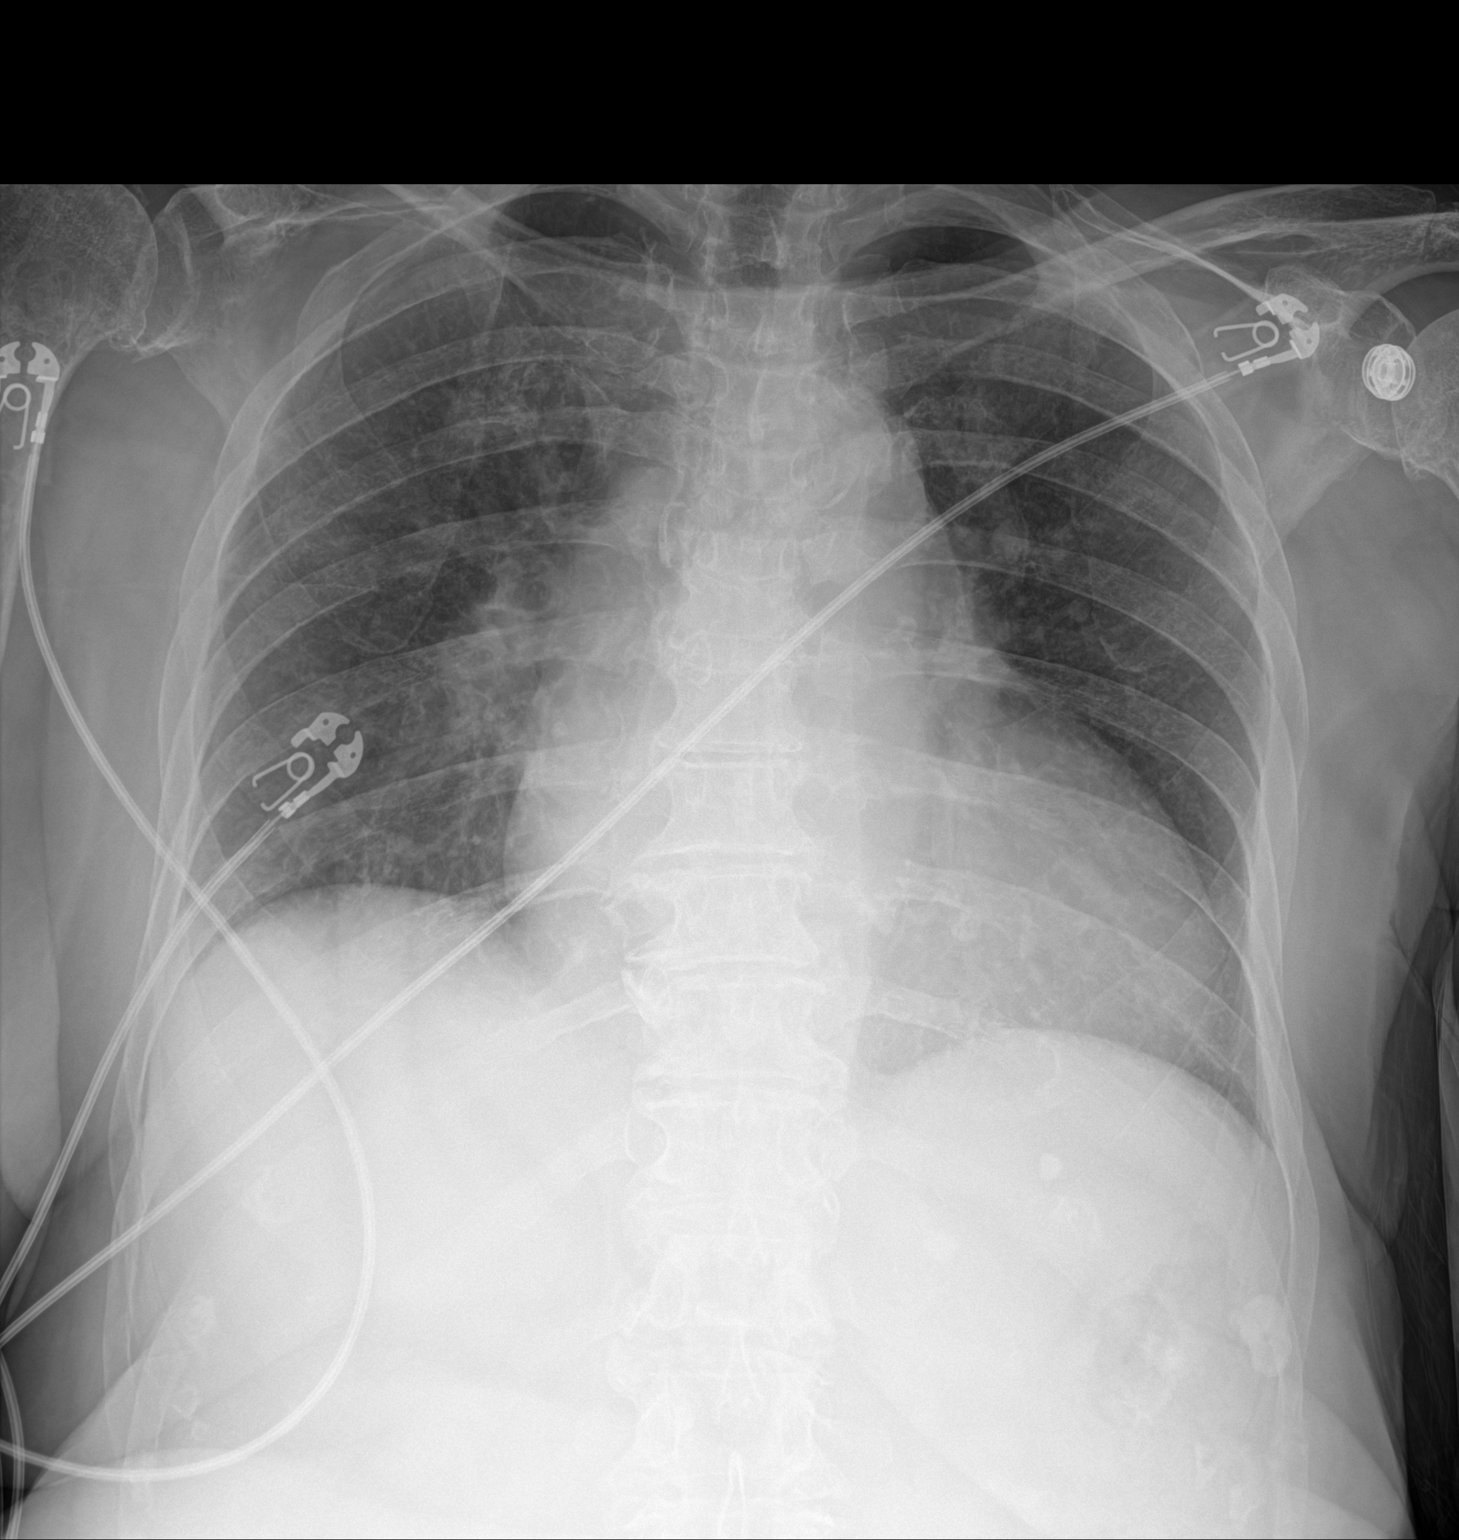

[1 of 1 positions shown; findings below may reference images not displayed]

FINDINGS: Cardiomegaly and prominence of the pulmonary vasculature. Both lungs
are clear. The visualized skeletal structures are unremarkable.
IMPRESSION: Cardiomegaly without acute abnormality of the lungs in AP portable
projection.

## 2020-08-30 NOTE — ED Notes (Signed)
Warm blankets placed on pt.

## 2020-08-30 NOTE — ED Notes (Signed)
PT eating lunch

## 2020-08-30 NOTE — ED Notes (Signed)
Pt to CT with Trauma RN

## 2020-08-30 NOTE — ED Notes (Signed)
[facility] was called for transport to [facility]

## 2020-08-30 NOTE — Discharge Instructions (Addendum)
CT imaging of her head and neck are unremarkable.  Chest x-ray and pelvic x-ray without any injuries.

## 2020-08-30 NOTE — ED Triage Notes (Signed)
PT here via [facility] for unwitnessed fall on blood thinners.  No obvious deformity or injury to head.  Pt c/o hip pain. Level II Trauma called on arrival.

## 2020-08-30 NOTE — ED Provider Notes (Signed)
[facility] Provider Note   CSN: [id number] Arrival date & time: 08/30/20  0102     History Chief Complaint  Patient presents with   Lytle Michaels     [facility] is a 75 y.o. female.  Level 5 caveat due to dementia.  Patient is verbal and does not complain of anything.  She does not remember what happened today.  Supposedly with EMS patient was using bathroom with staff at her memory care facility.  They left her alone for a minute to use the bathroom and when they came back she was found on the floor.  She is on blood thinners.  May be hip pain but patient denies any pain currently.  No obvious deformities  The history is provided by the patient.  Fall This is a new problem. The current episode started less than 1 hour ago. The problem occurs rarely. The problem has been resolved. Nothing aggravates the symptoms. Nothing relieves the symptoms. She has tried nothing for the symptoms. The treatment provided no relief.      Past Medical History:  Diagnosis Date   Anemia    Anxiety    Anxiety disorder    Anxiety disorder, unspecified    Arthritis    Atrial fibrillation (HCC)    Chronic fatigue    Chronic fatigue, unspecified    Cognitive communication deficit    Crohn's disease ([place])    Crohn's disease (regional enteritis) ([place])    Dementia ([place])    Depression    Essential hypertension    Gastroesophageal reflux disease without esophagitis    GERD (gastroesophageal reflux disease)    Heart murmur    Hypertension    Insomnia    Insomnia, unspecified    Major depression    Major depressive disorder    Muscle weakness (generalized)    Polyosteoarthritis    Reflux    Repeated falls    Vitamin B12 deficiency (dietary) anemia    Vitamin B12 deficiency anemia, unspecified    Vitamin D deficiency    Vitamin D deficiency, unspecified     Patient Active Problem List   Diagnosis Date Noted   UTI (urinary tract infection) 07/31/2020    Atrial fibrillation with RVR ([place]) 03/05/2020   Atrial fibrillation ([place]) 03/05/2020   AKI (acute kidney injury) ([place])    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/16/2019   Crohn disease ([place]) 02/25/2019   Chronic fatigue 02/25/2019   Vitamin B 12 deficiency 02/25/2019   Unilateral primary osteoarthritis, left knee 03/27/2018   Hypokalemia 02/20/2018   GERD (gastroesophageal reflux disease) 07/28/2017   Unilateral primary osteoarthritis, right knee 01/31/2017   Sebaceous cyst 10/21/2016   Anemia 10/20/2016   Primary osteoarthritis of right hip 09/12/2016   Pulmonary hypertension ([place]) 07/26/2016   OSA (obstructive sleep apnea) 07/26/2016   Morbid obesity ([place]) 04/14/2016   Hypertension, essential 04/13/2016   Vitamin D deficiency 04/13/2016   Insomnia 04/13/2016   Generalized anxiety disorder 04/13/2016   Major depression 04/13/2016   Osteoarthritis, 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/13/2016   Dyspnea 10/07/2015    Past Surgical History:  Procedure Laterality Date   ANKLE SURGERY     CARDIOVERSION N/A 03/09/2020   Procedure: CARDIOVERSION;  Surgeon: Larey Dresser, MD;  Location: [place];  Service: Cardiovascular;  Laterality: N/A;   CARDIOVERSION N/A 03/05/2020   Procedure: CARDIOVERSION;  Surgeon: Larey Dresser, MD;  Location: [facility] ENDOSCOPY;  Service: Cardiovascular;  Laterality: N/A;   CARPAL TUNNEL RELEASE  CHOLECYSTECTOMY     COLON SURGERY     RIGHT/LEFT HEART CATH AND CORONARY ANGIOGRAPHY N/A 09/15/2016   Procedure: RIGHT/LEFT HEART CATH AND CORONARY ANGIOGRAPHY;  Surgeon: Larey Dresser, MD;  Location: [place] [facility];  Service: Cardiovascular;  Laterality: N/A;   TEE WITHOUT CARDIOVERSION N/A 03/05/2020   Procedure: TRANSESOPHAGEAL ECHOCARDIOGRAM (TEE);  Surgeon: Larey Dresser, MD;  Location: [facility] ENDOSCOPY;  Service: Cardiovascular;  Laterality: N/A;     OB History   No obstetric history on file.     Family History  Problem Relation Age of  Onset   Heart disease Mother        RF   Stroke Father    Diabetes Father    Hyperlipidemia Father    Hypertension Father     Social History   Tobacco Use   Smoking status: Never   Smokeless tobacco: Never  Vaping Use   Vaping Use: Unknown  Substance Use Topics   Alcohol use: Not Currently   Drug use: Never    Home Medications Prior to Admission medications   Medication Sig Start Date End Date Taking? Authorizing Provider  acetaminophen (TYLENOL) 500 MG tablet Take 1,000 mg by mouth every 6 (six) hours as needed for mild pain.    [provider]  apixaban (ELIQUIS) 5 MG TABS tablet Take 1 tablet (5 mg total) by mouth 2 (two) times daily. 03/09/20   Clegg, Amy D, NP  balsalazide (COLAZAL) 750 MG capsule Take 2 capsules (1,500 mg total) by mouth 2 (two) times daily. 01/30/20   Zehr, Laban Emperor, PA-C  Calcium Carb-Cholecalciferol (CALCIUM-VITAMIN D3) 600-200 MG-UNIT TABS Take 2 tablets by mouth daily.    [provider]  Cholecalciferol (VITAMIN D3) 50 MCG (2000 UT) capsule TAKE 1 CAPSULE BY MOUTH EVERY DAY 01/20/20   [place], Betty G, MD  divalproex (DEPAKOTE SPRINKLE) 125 MG capsule Take 125 mg by mouth 2 (two) times daily.    [provider]  esomeprazole (NEXIUM) 20 MG capsule Take 20 mg by mouth daily.    [provider]  flecainide (TAMBOCOR) 100 MG tablet Take 1 tablet (100 mg total) by mouth every 12 (twelve) hours. 03/09/20   Clegg, Amy D, NP  LORazepam (ATIVAN) 0.5 MG tablet Take 1 tablet (0.5 mg total) by mouth every 12 (twelve) hours as needed for anxiety. 08/03/20   Patrecia Pour, MD  Metoprolol Succinate 50 MG CS24 Take 50 mg by mouth daily. Metoprolol Succinate Capsule ER 24 hour sprinkle 27m. HOLD for HR <60bpm. 08/03/20   GPatrecia Pour MD  sertraline (ZOLOFT) 50 MG tablet Take 50 mg by mouth daily. 05/31/20   [provider]  traZODone (DESYREL) 50 MG tablet Take 50 mg by mouth at bedtime.    [provider]  Venlafaxine  HCl 75 MG TB24 Take 75 mg by mouth every other day. 04/24/20   [provider]  vitamin B-12 (CYANOCOBALAMIN) 1000 MCG tablet Take 1 tablet (1,000 mcg total) by mouth daily. 01/20/20   JMartinique Betty G, MD    Allergies    Penicillin g  Review of Systems   Review of Systems  Unable to perform ROS: Dementia   Physical Exam Updated Vital Signs There were no vitals taken for this visit.  Physical Exam Vitals and nursing note reviewed.  Constitutional:      General: She is not in acute distress.    Appearance: She is well-developed. She is not ill-appearing.  HENT:  Head: Normocephalic and atraumatic.     Nose: Nose normal.     Mouth/Throat:     Mouth: Mucous membranes are moist.  Eyes:     Extraocular Movements: Extraocular movements intact.     Conjunctiva/sclera: Conjunctivae normal.     Pupils: Pupils are equal, round, and reactive to light.  Cardiovascular:     Rate and Rhythm: Normal rate and regular rhythm.     Pulses: Normal pulses.     Heart sounds: Normal heart sounds. No murmur heard. Pulmonary:     Effort: Pulmonary effort is normal. No respiratory distress.     Breath sounds: Normal breath sounds.  Abdominal:     Palpations: Abdomen is soft.     Tenderness: There is no abdominal tenderness.  Musculoskeletal:        General: No tenderness. Normal range of motion.     Cervical back: Neck supple. No tenderness.     Comments: No midline spinal tenderness, no tenderness to palpation of major joints including the hips  Skin:    General: Skin is warm and dry.     Capillary Refill: Capillary refill takes less than 2 seconds.     Findings: Bruising present.     Comments: Chronic appearing bruising to the lower lip  Neurological:     General: No focal deficit present.     Mental Status: She is alert.  Psychiatric:        Mood and Affect: Mood normal.    ED Results / Procedures / Treatments   Labs (all labs ordered are listed, but only abnormal results  are displayed) Labs Reviewed - No data to display  EKG None  Radiology CT Head Wo Contrast  Result Date: 08/30/2020 CLINICAL DATA:  Unwitnessed fall, blood thinners EXAM: CT HEAD WITHOUT CONTRAST CT CERVICAL SPINE WITHOUT CONTRAST TECHNIQUE: Multidetector CT imaging of the head and cervical spine was performed following the standard protocol without intravenous contrast. Multiplanar CT image reconstructions of the cervical spine were also generated. COMPARISON:  04/21/2020 FINDINGS: CT HEAD FINDINGS Brain: No evidence of acute infarction, hemorrhage, hydrocephalus, extra-axial collection or mass lesion/mass effect. Periventricular and deep white matter hypodensity. Vascular: No hyperdense vessel or unexpected calcification. Skull: Normal. Negative for fracture or focal lesion. Sinuses/Orbits: No acute finding. Other: None. CT CERVICAL SPINE FINDINGS Alignment: Normal. Skull base and vertebrae: No acute fracture. No primary bone lesion or focal pathologic process. Soft tissues and spinal canal: No prevertebral fluid or swelling. No visible canal hematoma. Disc levels: Moderate disc space height loss and osteophytosis of C5-C7. Disc spaces are otherwise relatively preserved. Upper chest: Negative. Other: None. IMPRESSION: 1. No acute intracranial pathology. Small-vessel white matter disease. 2. No fracture or static subluxation of the cervical spine. 3. Moderate disc space height loss and osteophytosis of C5-C7. Electronically Signed   By: Eddie Candle M.D.   On: 08/30/2020 10:32   CT Cervical Spine Wo Contrast  Result Date: 08/30/2020 CLINICAL DATA:  Unwitnessed fall, blood thinners EXAM: CT HEAD WITHOUT CONTRAST CT CERVICAL SPINE WITHOUT CONTRAST TECHNIQUE: Multidetector CT imaging of the head and cervical spine was performed following the standard protocol without intravenous contrast. Multiplanar CT image reconstructions of the cervical spine were also generated. COMPARISON:  04/21/2020 FINDINGS: CT  HEAD FINDINGS Brain: No evidence of acute infarction, hemorrhage, hydrocephalus, extra-axial collection or mass lesion/mass effect. Periventricular and deep white matter hypodensity. Vascular: No hyperdense vessel or unexpected calcification. Skull: Normal. Negative for fracture or focal lesion. Sinuses/Orbits: No acute finding. Other: None.  CT CERVICAL SPINE FINDINGS Alignment: Normal. Skull base and vertebrae: No acute fracture. No primary bone lesion or focal pathologic process. Soft tissues and spinal canal: No prevertebral fluid or swelling. No visible canal hematoma. Disc levels: Moderate disc space height loss and osteophytosis of C5-C7. Disc spaces are otherwise relatively preserved. Upper chest: Negative. Other: None. IMPRESSION: 1. No acute intracranial pathology. Small-vessel white matter disease. 2. No fracture or static subluxation of the cervical spine. 3. Moderate disc space height loss and osteophytosis of C5-C7. Electronically Signed   By: Eddie Candle M.D.   On: 08/30/2020 10:32   DG Pelvis Portable  Result Date: 08/30/2020 CLINICAL DATA:  fall EXAM: PORTABLE PELVIS 1-2 VIEWS COMPARISON:  09/12/2016 (without report. FINDINGS: There is no evidence of pelvic fracture or diastasis. No hip dislocation. Severe right and mild-to-moderate left hip degenerative change. Pelvic enthesopathy. Lower lumbar degenerative change. Rounded calcification in the imaged left abdomen. Midline sutures. IMPRESSION: 1. No evidence of acute fracture. 2. Severe right and mild to moderate left hip degenerative change. 3. Rounded calcification in the imaged left abdomen, nonspecific but potentially a renal calculus versus hyperdense material in the fecal stream. Electronically Signed   By: Margaretha Sheffield MD   On: 08/30/2020 10:07   DG Chest Portable 1 View  Result Date: 08/30/2020 CLINICAL DATA:  Fall, blood thinners EXAM: PORTABLE CHEST 1 VIEW COMPARISON:  03/18/2020 FINDINGS: Cardiomegaly and prominence of the  pulmonary vasculature. Both lungs are clear. The visualized skeletal structures are unremarkable. IMPRESSION: Cardiomegaly without acute abnormality of the lungs in AP portable projection. Electronically Signed   By: Eddie Candle M.D.   On: 08/30/2020 10:03    Procedures Procedures   Medications Ordered in ED Medications - No data to display  ED Course  I have reviewed the triage vital signs and the nursing notes.  Pertinent labs & imaging results that were available during my care of the patient were reviewed by me and considered in my medical decision making (see chart for details).    MDM Rules/Calculators/A&P                           [facility] is here after unwitnessed fall at her nursing home.  History of dementia.  History of A. fib on blood thinners.  Patient had fall off the toilet after being assisted there by staff.  She was left alone to use the bathroom and they found her on the floor.  Maybe she is complaining of some hip pain but upon arrival here she has no complaints.  She is well-appearing.  She has bruise to the lower face that has been there already per EMS.  She has no obvious deformities.  Neurologically she appears intact.  We will get head CT, neck CT, chest x-ray, pelvis x-ray.  No tenderness to any major joint spaces.  Imaging unremarkable.  Patient discharged back to facility.  This chart was dictated using voice recognition software.  Despite best efforts to proofread,  errors can occur which can change the documentation meaning.  Final Clinical Impression(s) / ED Diagnoses Final diagnoses:  Fall, initial encounter    Rx / DC Orders ED Discharge Orders     None        Lennice Sites, DO 08/30/20 1038

## 2020-08-30 NOTE — Progress Notes (Signed)
Orthopedic Tech Progress Note Patient Details:  [facility] June 30, 1945 [id number] Level 2 trauma Patient ID: Vladimir Faster, female   DOB: 1945/07/24, 75 y.o.   MRN: [id number]  [facility] 08/30/2020, 10:58 AM

## 2020-08-31 DIAGNOSIS — R296 Repeated falls: Secondary | ICD-10-CM | POA: Diagnosis not present

## 2020-08-31 DIAGNOSIS — F0391 Unspecified dementia with behavioral disturbance: Secondary | ICD-10-CM | POA: Diagnosis not present

## 2020-08-31 DIAGNOSIS — I480 Paroxysmal atrial fibrillation: Secondary | ICD-10-CM | POA: Diagnosis not present

## 2020-08-31 DIAGNOSIS — K219 Gastro-esophageal reflux disease without esophagitis: Secondary | ICD-10-CM | POA: Diagnosis not present

## 2020-09-02 DIAGNOSIS — N1832 Chronic kidney disease, stage 3b: Secondary | ICD-10-CM | POA: Diagnosis not present

## 2020-09-02 DIAGNOSIS — L988 Other specified disorders of the skin and subcutaneous tissue: Secondary | ICD-10-CM | POA: Diagnosis not present

## 2020-09-02 DIAGNOSIS — I129 Hypertensive chronic kidney disease with stage 1 through stage 4 chronic kidney disease, or unspecified chronic kidney disease: Secondary | ICD-10-CM | POA: Diagnosis not present

## 2020-09-08 DIAGNOSIS — N1832 Chronic kidney disease, stage 3b: Secondary | ICD-10-CM | POA: Diagnosis not present

## 2020-09-08 DIAGNOSIS — I129 Hypertensive chronic kidney disease with stage 1 through stage 4 chronic kidney disease, or unspecified chronic kidney disease: Secondary | ICD-10-CM | POA: Diagnosis not present

## 2020-09-08 DIAGNOSIS — F331 Major depressive disorder, recurrent, moderate: Secondary | ICD-10-CM | POA: Diagnosis not present

## 2020-09-08 DIAGNOSIS — K5090 Crohn's disease, unspecified, without complications: Secondary | ICD-10-CM | POA: Diagnosis not present

## 2020-09-08 DIAGNOSIS — I272 Pulmonary hypertension, unspecified: Secondary | ICD-10-CM | POA: Diagnosis not present

## 2020-09-08 DIAGNOSIS — M150 Primary generalized (osteo)arthritis: Secondary | ICD-10-CM | POA: Diagnosis not present

## 2020-09-08 DIAGNOSIS — G4700 Insomnia, unspecified: Secondary | ICD-10-CM | POA: Diagnosis not present

## 2020-09-08 DIAGNOSIS — G9341 Metabolic encephalopathy: Secondary | ICD-10-CM | POA: Diagnosis not present

## 2020-09-08 DIAGNOSIS — I480 Paroxysmal atrial fibrillation: Secondary | ICD-10-CM | POA: Diagnosis not present

## 2020-09-08 DIAGNOSIS — F0280 Dementia in other diseases classified elsewhere without behavioral disturbance: Secondary | ICD-10-CM | POA: Diagnosis not present

## 2020-09-08 DIAGNOSIS — F411 Generalized anxiety disorder: Secondary | ICD-10-CM | POA: Diagnosis not present

## 2020-09-08 DIAGNOSIS — D631 Anemia in chronic kidney disease: Secondary | ICD-10-CM | POA: Diagnosis not present

## 2020-09-08 DIAGNOSIS — E538 Deficiency of other specified B group vitamins: Secondary | ICD-10-CM | POA: Diagnosis not present

## 2020-09-08 DIAGNOSIS — K219 Gastro-esophageal reflux disease without esophagitis: Secondary | ICD-10-CM | POA: Diagnosis not present

## 2020-09-08 DIAGNOSIS — Z7901 Long term (current) use of anticoagulants: Secondary | ICD-10-CM | POA: Diagnosis not present

## 2020-09-08 DIAGNOSIS — E559 Vitamin D deficiency, unspecified: Secondary | ICD-10-CM | POA: Diagnosis not present

## 2020-09-08 DIAGNOSIS — Z6830 Body mass index (BMI) 30.0-30.9, adult: Secondary | ICD-10-CM | POA: Diagnosis not present

## 2020-09-08 DIAGNOSIS — N390 Urinary tract infection, site not specified: Secondary | ICD-10-CM | POA: Diagnosis not present

## 2020-09-08 DIAGNOSIS — L988 Other specified disorders of the skin and subcutaneous tissue: Secondary | ICD-10-CM | POA: Diagnosis not present

## 2020-09-08 DIAGNOSIS — E785 Hyperlipidemia, unspecified: Secondary | ICD-10-CM | POA: Diagnosis not present

## 2020-09-08 DIAGNOSIS — G4733 Obstructive sleep apnea (adult) (pediatric): Secondary | ICD-10-CM | POA: Diagnosis not present

## 2020-09-08 DIAGNOSIS — Z9181 History of falling: Secondary | ICD-10-CM | POA: Diagnosis not present

## 2020-09-11 ENCOUNTER — Emergency Department (HOSPITAL_COMMUNITY): Payer: PPO

## 2020-09-11 ENCOUNTER — Inpatient Hospital Stay (HOSPITAL_COMMUNITY)
Admission: EM | Admit: 2020-09-11 | Discharge: 2020-09-16 | DRG: 280 | Disposition: A | Discharge status: Hospice | Payer: PPO | Source: Skilled Nursing Facility | Attending: Family Medicine | Admitting: Family Medicine

## 2020-09-11 DIAGNOSIS — I083 Combined rheumatic disorders of mitral, aortic and tricuspid valves: Secondary | ICD-10-CM | POA: Diagnosis present

## 2020-09-11 DIAGNOSIS — R410 Disorientation, unspecified: Secondary | ICD-10-CM | POA: Diagnosis not present

## 2020-09-11 DIAGNOSIS — Z66 Do not resuscitate: Secondary | ICD-10-CM | POA: Diagnosis not present

## 2020-09-11 DIAGNOSIS — Z823 Family history of stroke: Secondary | ICD-10-CM

## 2020-09-11 DIAGNOSIS — I4891 Unspecified atrial fibrillation: Secondary | ICD-10-CM | POA: Diagnosis present

## 2020-09-11 DIAGNOSIS — D72829 Elevated white blood cell count, unspecified: Secondary | ICD-10-CM

## 2020-09-11 DIAGNOSIS — I5043 Acute on chronic combined systolic (congestive) and diastolic (congestive) heart failure: Secondary | ICD-10-CM

## 2020-09-11 DIAGNOSIS — E8809 Other disorders of plasma-protein metabolism, not elsewhere classified: Secondary | ICD-10-CM | POA: Diagnosis not present

## 2020-09-11 DIAGNOSIS — R29818 Other symptoms and signs involving the nervous system: Secondary | ICD-10-CM | POA: Diagnosis not present

## 2020-09-11 DIAGNOSIS — Z20822 Contact with and (suspected) exposure to covid-19: Secondary | ICD-10-CM | POA: Diagnosis not present

## 2020-09-11 DIAGNOSIS — R4182 Altered mental status, unspecified: Secondary | ICD-10-CM | POA: Diagnosis not present

## 2020-09-11 DIAGNOSIS — Z515 Encounter for palliative care: Secondary | ICD-10-CM | POA: Diagnosis not present

## 2020-09-11 DIAGNOSIS — I482 Chronic atrial fibrillation, unspecified: Secondary | ICD-10-CM | POA: Diagnosis not present

## 2020-09-11 DIAGNOSIS — N1832 Chronic kidney disease, stage 3b: Secondary | ICD-10-CM | POA: Diagnosis present

## 2020-09-11 DIAGNOSIS — N189 Chronic kidney disease, unspecified: Secondary | ICD-10-CM

## 2020-09-11 DIAGNOSIS — R778 Other specified abnormalities of plasma proteins: Secondary | ICD-10-CM | POA: Diagnosis not present

## 2020-09-11 DIAGNOSIS — R7401 Elevation of levels of liver transaminase levels: Secondary | ICD-10-CM | POA: Diagnosis not present

## 2020-09-11 DIAGNOSIS — K219 Gastro-esophageal reflux disease without esophagitis: Secondary | ICD-10-CM | POA: Diagnosis present

## 2020-09-11 DIAGNOSIS — I130 Hypertensive heart and chronic kidney disease with heart failure and stage 1 through stage 4 chronic kidney disease, or unspecified chronic kidney disease: Secondary | ICD-10-CM | POA: Diagnosis not present

## 2020-09-11 DIAGNOSIS — I517 Cardiomegaly: Secondary | ICD-10-CM | POA: Diagnosis not present

## 2020-09-11 DIAGNOSIS — R54 Age-related physical debility: Secondary | ICD-10-CM | POA: Diagnosis present

## 2020-09-11 DIAGNOSIS — E785 Hyperlipidemia, unspecified: Secondary | ICD-10-CM

## 2020-09-11 DIAGNOSIS — F329 Major depressive disorder, single episode, unspecified: Secondary | ICD-10-CM | POA: Diagnosis present

## 2020-09-11 DIAGNOSIS — E871 Hypo-osmolality and hyponatremia: Secondary | ICD-10-CM | POA: Diagnosis not present

## 2020-09-11 DIAGNOSIS — F419 Anxiety disorder, unspecified: Secondary | ICD-10-CM | POA: Diagnosis present

## 2020-09-11 DIAGNOSIS — G4733 Obstructive sleep apnea (adult) (pediatric): Secondary | ICD-10-CM | POA: Diagnosis present

## 2020-09-11 DIAGNOSIS — R29712 NIHSS score 12: Secondary | ICD-10-CM | POA: Diagnosis present

## 2020-09-11 DIAGNOSIS — D649 Anemia, unspecified: Secondary | ICD-10-CM | POA: Diagnosis not present

## 2020-09-11 DIAGNOSIS — L89890 Pressure ulcer of other site, unstageable: Secondary | ICD-10-CM | POA: Diagnosis not present

## 2020-09-11 DIAGNOSIS — Z7189 Other specified counseling: Secondary | ICD-10-CM | POA: Diagnosis not present

## 2020-09-11 DIAGNOSIS — I272 Pulmonary hypertension, unspecified: Secondary | ICD-10-CM | POA: Diagnosis not present

## 2020-09-11 DIAGNOSIS — G9341 Metabolic encephalopathy: Secondary | ICD-10-CM | POA: Diagnosis not present

## 2020-09-11 DIAGNOSIS — E782 Mixed hyperlipidemia: Secondary | ICD-10-CM | POA: Diagnosis not present

## 2020-09-11 DIAGNOSIS — K5090 Crohn's disease, unspecified, without complications: Secondary | ICD-10-CM | POA: Diagnosis not present

## 2020-09-11 DIAGNOSIS — R404 Transient alteration of awareness: Secondary | ICD-10-CM | POA: Diagnosis not present

## 2020-09-11 DIAGNOSIS — I214 Non-ST elevation (NSTEMI) myocardial infarction: Secondary | ICD-10-CM | POA: Diagnosis not present

## 2020-09-11 DIAGNOSIS — I5042 Chronic combined systolic (congestive) and diastolic (congestive) heart failure: Secondary | ICD-10-CM | POA: Diagnosis present

## 2020-09-11 DIAGNOSIS — Z8249 Family history of ischemic heart disease and other diseases of the circulatory system: Secondary | ICD-10-CM

## 2020-09-11 DIAGNOSIS — I639 Cerebral infarction, unspecified: Secondary | ICD-10-CM | POA: Diagnosis not present

## 2020-09-11 DIAGNOSIS — I6340 Cerebral infarction due to embolism of unspecified cerebral artery: Secondary | ICD-10-CM | POA: Insufficient documentation

## 2020-09-11 DIAGNOSIS — I63413 Cerebral infarction due to embolism of bilateral middle cerebral arteries: Secondary | ICD-10-CM | POA: Diagnosis not present

## 2020-09-11 DIAGNOSIS — R001 Bradycardia, unspecified: Secondary | ICD-10-CM | POA: Diagnosis not present

## 2020-09-11 DIAGNOSIS — R9431 Abnormal electrocardiogram [ECG] [EKG]: Secondary | ICD-10-CM

## 2020-09-11 DIAGNOSIS — R7989 Other specified abnormal findings of blood chemistry: Secondary | ICD-10-CM

## 2020-09-11 DIAGNOSIS — R079 Chest pain, unspecified: Secondary | ICD-10-CM | POA: Diagnosis not present

## 2020-09-11 DIAGNOSIS — Z9049 Acquired absence of other specified parts of digestive tract: Secondary | ICD-10-CM

## 2020-09-11 DIAGNOSIS — Z7901 Long term (current) use of anticoagulants: Secondary | ICD-10-CM

## 2020-09-11 DIAGNOSIS — F0390 Unspecified dementia without behavioral disturbance: Secondary | ICD-10-CM | POA: Diagnosis present

## 2020-09-11 DIAGNOSIS — I10 Essential (primary) hypertension: Secondary | ICD-10-CM | POA: Diagnosis present

## 2020-09-11 DIAGNOSIS — E1122 Type 2 diabetes mellitus with diabetic chronic kidney disease: Secondary | ICD-10-CM | POA: Diagnosis present

## 2020-09-11 DIAGNOSIS — Z83438 Family history of other disorder of lipoprotein metabolism and other lipidemia: Secondary | ICD-10-CM

## 2020-09-11 DIAGNOSIS — I480 Paroxysmal atrial fibrillation: Secondary | ICD-10-CM | POA: Diagnosis not present

## 2020-09-11 DIAGNOSIS — R5382 Chronic fatigue, unspecified: Secondary | ICD-10-CM | POA: Diagnosis present

## 2020-09-11 DIAGNOSIS — G4700 Insomnia, unspecified: Secondary | ICD-10-CM | POA: Diagnosis present

## 2020-09-11 DIAGNOSIS — Z833 Family history of diabetes mellitus: Secondary | ICD-10-CM

## 2020-09-11 DIAGNOSIS — Z781 Physical restraint status: Secondary | ICD-10-CM

## 2020-09-11 DIAGNOSIS — Z880 Allergy status to penicillin: Secondary | ICD-10-CM

## 2020-09-11 LAB — COMPREHENSIVE METABOLIC PANEL
ALT: 13 U/L (ref 0–44)
AST: 93 U/L — ABNORMAL HIGH (ref 15–41)
Albumin: 2.6 g/dL — ABNORMAL LOW (ref 3.5–5.0)
Alkaline Phosphatase: 105 U/L (ref 38–126)
Anion gap: 12 (ref 5–15)
BUN: 27 mg/dL — ABNORMAL HIGH (ref 8–23)
CO2: 22 mmol/L (ref 22–32)
Calcium: 8.5 mg/dL — ABNORMAL LOW (ref 8.9–10.3)
Chloride: 97 mmol/L — ABNORMAL LOW (ref 98–111)
Creatinine, Ser: 1.36 mg/dL — ABNORMAL HIGH (ref 0.44–1.00)
GFR, Estimated: 40 mL/min — ABNORMAL LOW (ref >60)
Glucose, Bld: 141 mg/dL — ABNORMAL HIGH (ref 70–99)
Potassium: 3.9 mmol/L (ref 3.5–5.1)
Sodium: 131 mmol/L — ABNORMAL LOW (ref 135–145)
Total Bilirubin: 1 mg/dL (ref 0.3–1.2)
Total Protein: 5.9 g/dL — ABNORMAL LOW (ref 6.5–8.1)

## 2020-09-11 LAB — CBC WITH DIFFERENTIAL/PLATELET
Abs Immature Granulocytes: 0.08 10*3/uL — ABNORMAL HIGH (ref 0.00–0.07)
Basophils Absolute: 0.1 10*3/uL (ref 0.0–0.1)
Basophils Relative: 1 %
Eosinophils Absolute: 0.1 10*3/uL (ref 0.0–0.5)
Eosinophils Relative: 0 %
HCT: 31.6 % — ABNORMAL LOW (ref 36.0–46.0)
Hemoglobin: 9.6 g/dL — ABNORMAL LOW (ref 12.0–15.0)
Immature Granulocytes: 1 %
Lymphocytes Relative: 9 %
Lymphs Abs: 1.3 10*3/uL (ref 0.7–4.0)
MCH: 25.4 pg — ABNORMAL LOW (ref 26.0–34.0)
MCHC: 30.4 g/dL (ref 30.0–36.0)
MCV: 83.6 fL (ref 80.0–100.0)
Monocytes Absolute: 1 10*3/uL (ref 0.1–1.0)
Monocytes Relative: 7 %
Neutro Abs: 11.9 10*3/uL — ABNORMAL HIGH (ref 1.7–7.7)
Neutrophils Relative %: 82 %
Platelets: 223 10*3/uL (ref 150–400)
RBC: 3.78 MIL/uL — ABNORMAL LOW (ref 3.87–5.11)
RDW: 17.3 % — ABNORMAL HIGH (ref 11.5–15.5)
WBC: 14.4 10*3/uL — ABNORMAL HIGH (ref 4.0–10.5)
nRBC: 0.2 % (ref 0.0–0.2)

## 2020-09-11 LAB — TROPONIN I (HIGH SENSITIVITY)
Troponin I (High Sensitivity): 147 ng/L (ref <18)
Troponin I (High Sensitivity): 134 ng/L (ref <18)

## 2020-09-11 IMAGING — CT CT HEAD W/O CM
1 of 2 series · 16 of 30 positions shown, 20 images · non-contrast
Comparison: Head CT dated [DATE].

CLINICAL DATA: Altered mental status.

EXAM:
CT HEAD WITHOUT CONTRAST
TECHNIQUE: Contiguous axial images were obtained from the base of the skull
through the vertex without intravenous contrast.

[Series 6: head 3.0 mpr ax · axial · 0.37mm/px · z∈[-122,+4]mm · 16 of 48 slices shown, 20 images]
[im 3/48  brain]
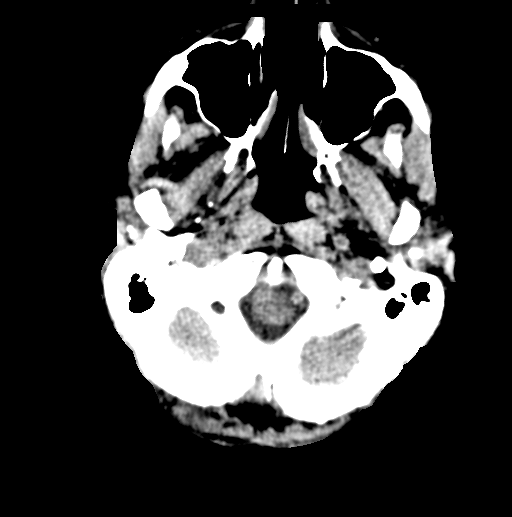
[im 3/48  bone]
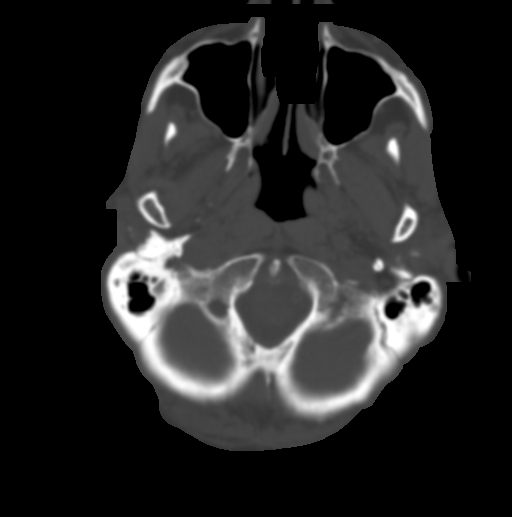
[im 6/48  brain]
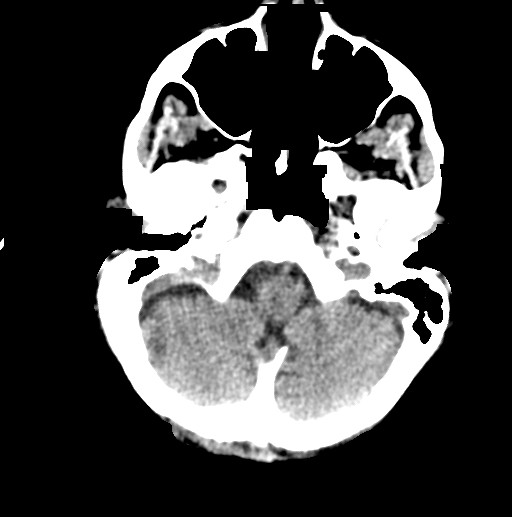
[im 9/48  brain]
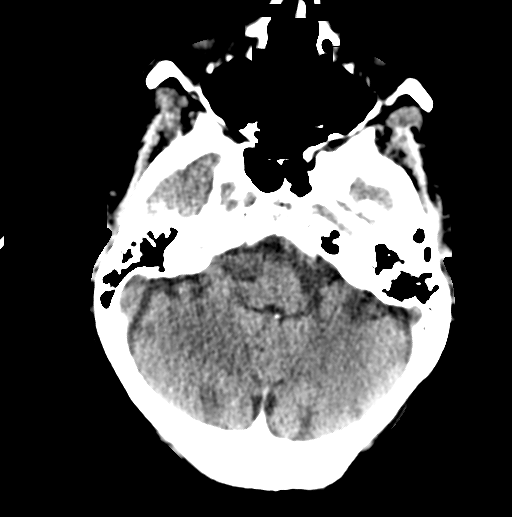
[im 12/48  brain]
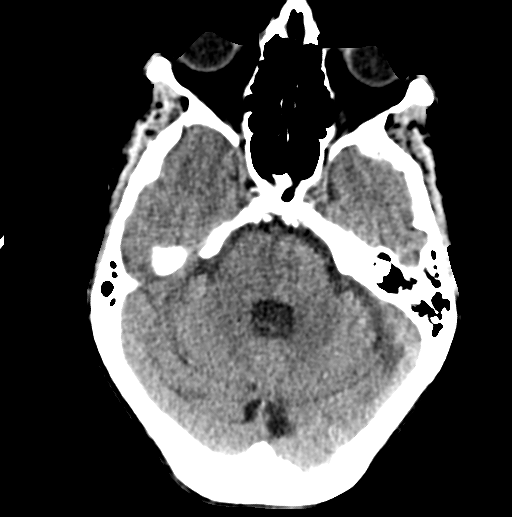
[im 14/48  brain]
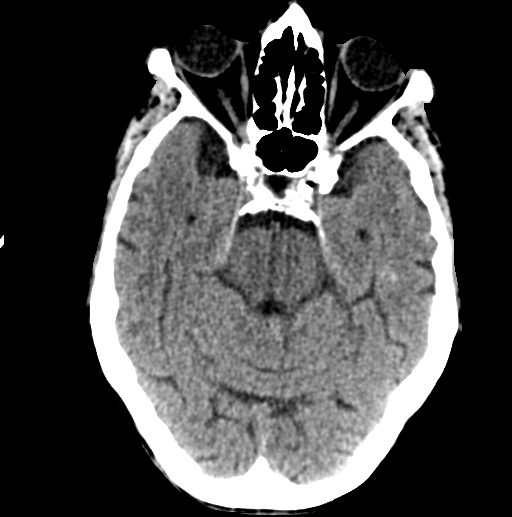
[im 14/48  bone]
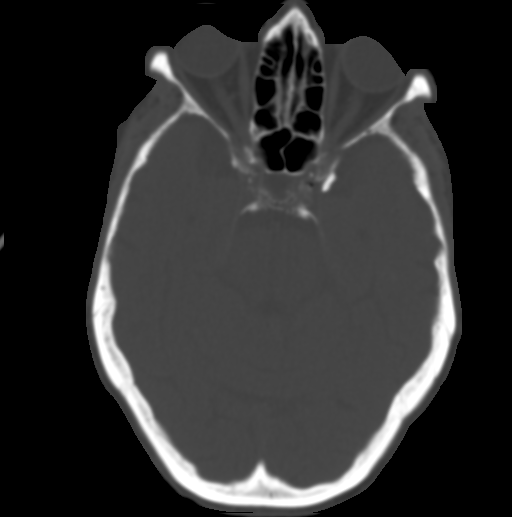
[im 17/48  brain]
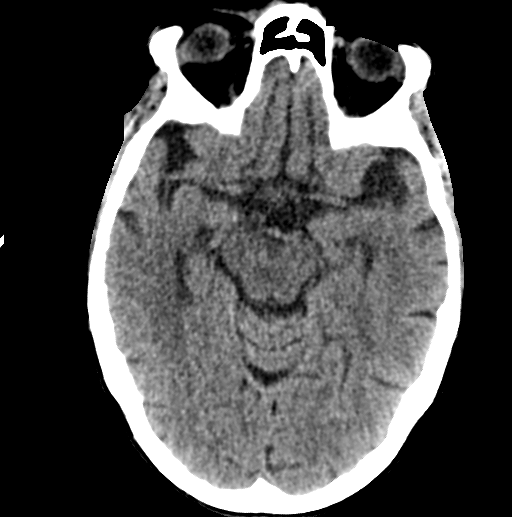
[im 20/48  brain]
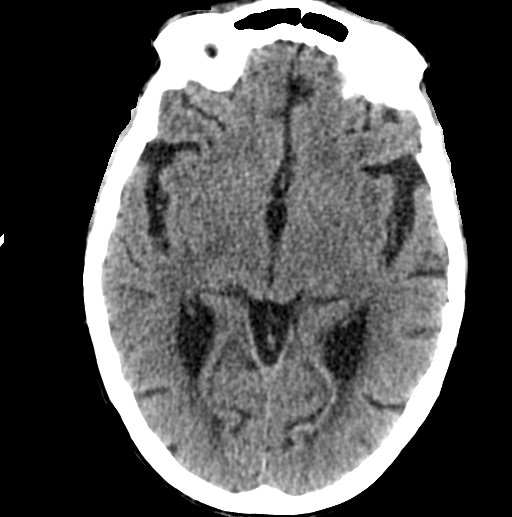
[im 23/48  brain]
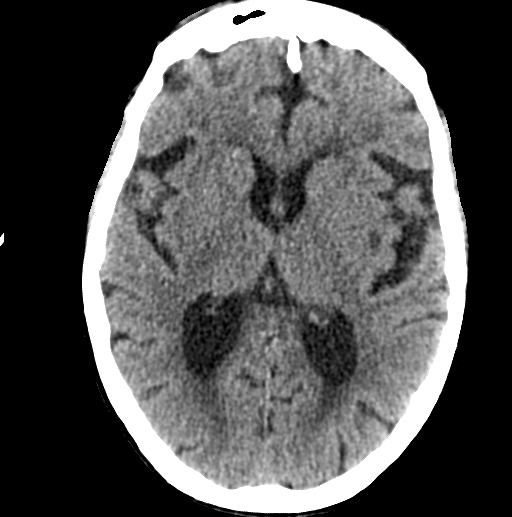
[im 25/48  brain]
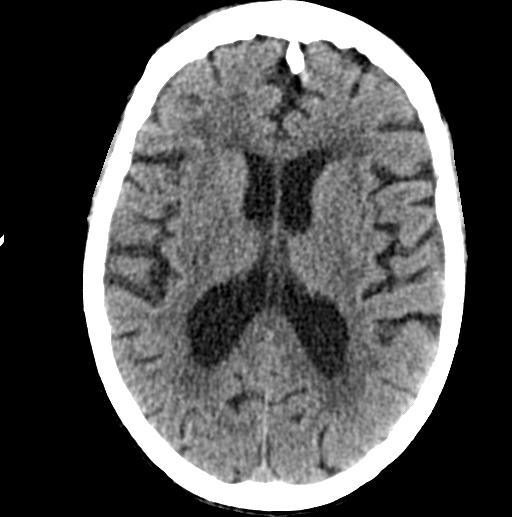
[im 25/48  bone]
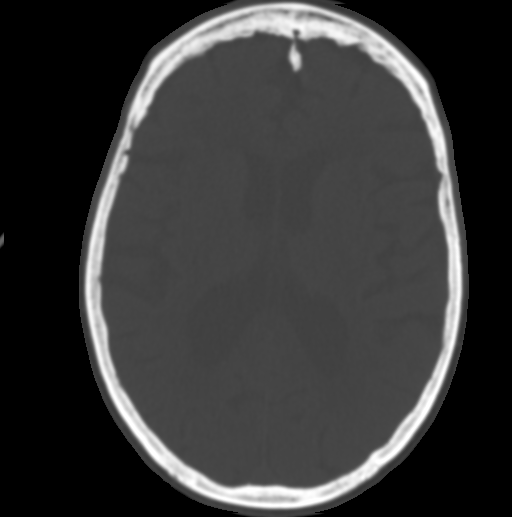
[im 28/48  brain]
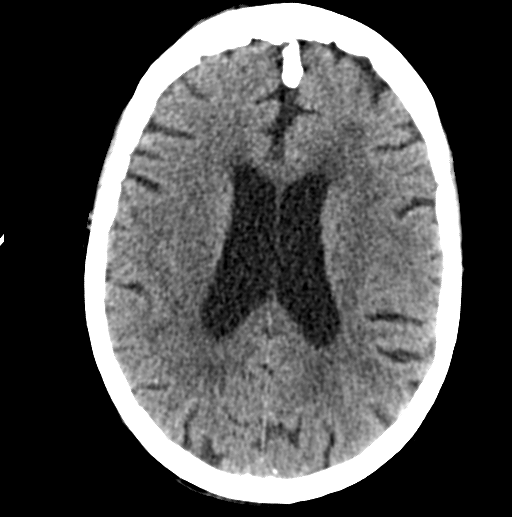
[im 31/48  brain]
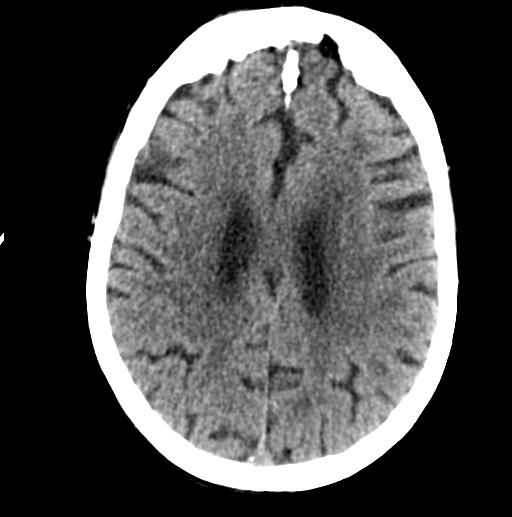
[im 34/48  brain]
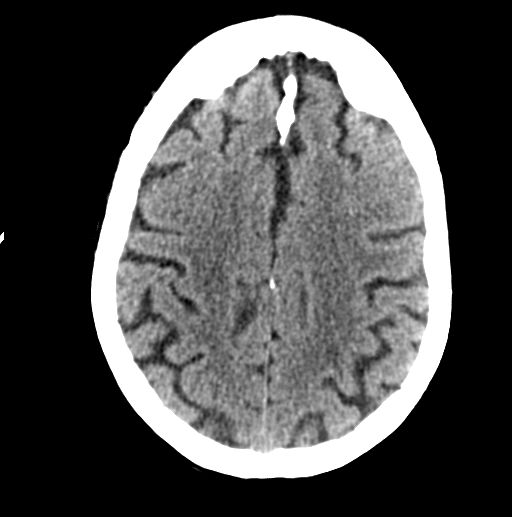
[im 36/48  brain]
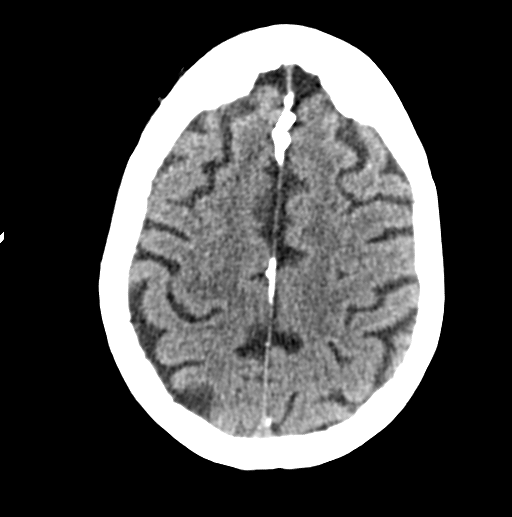
[im 36/48  bone]
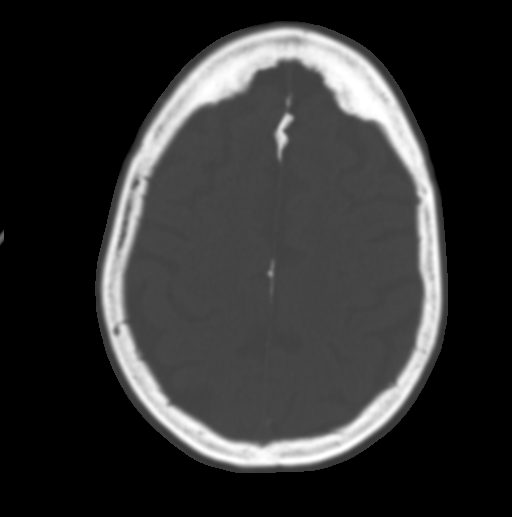
[im 39/48  brain]
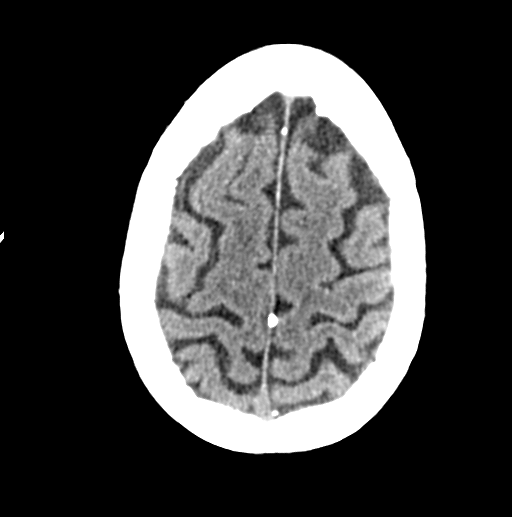
[im 42/48  brain]
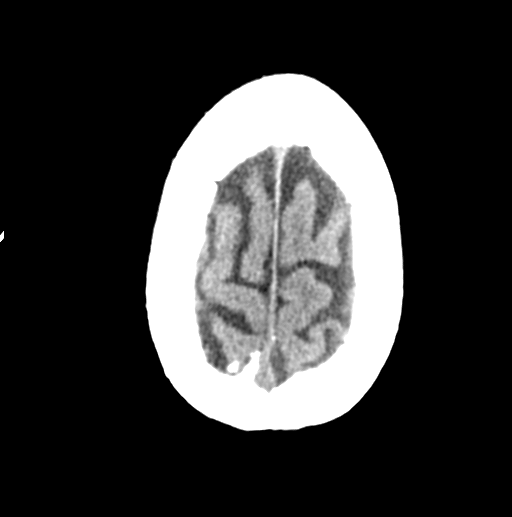
[im 45/48  brain]
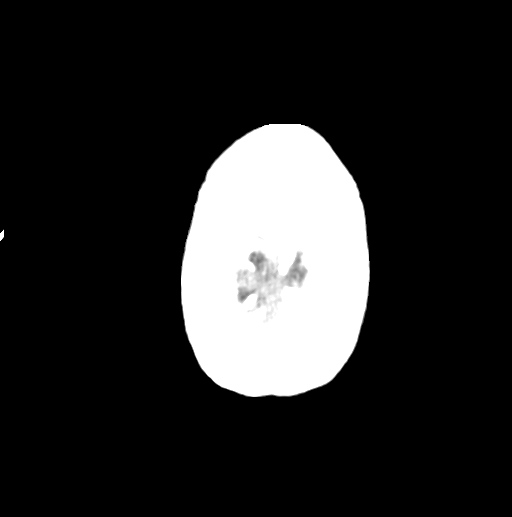

[16 of 30 positions shown; findings below may reference images not displayed]

FINDINGS: Brain: There is mild age-related atrophy and chronic microvascular
ischemic changes. There is no acute intracranial hemorrhage. No mass
effect or midline shift. No extra-axial fluid collection.

Vascular: No hyperdense vessel or unexpected calcification.

Skull: Normal. Negative for fracture or focal lesion.

Sinuses/Orbits: No acute finding.

Other: None
IMPRESSION: 1. No acute intracranial pathology.
2. Mild age-related atrophy and chronic microvascular ischemic
changes.

## 2020-09-11 IMAGING — DX DG CHEST 1V PORT
1 series · 1 of 1 positions shown · non-contrast
Comparison: [DATE]

CLINICAL DATA: Chest pain

EXAM:
PORTABLE CHEST 1 VIEW

[chest]
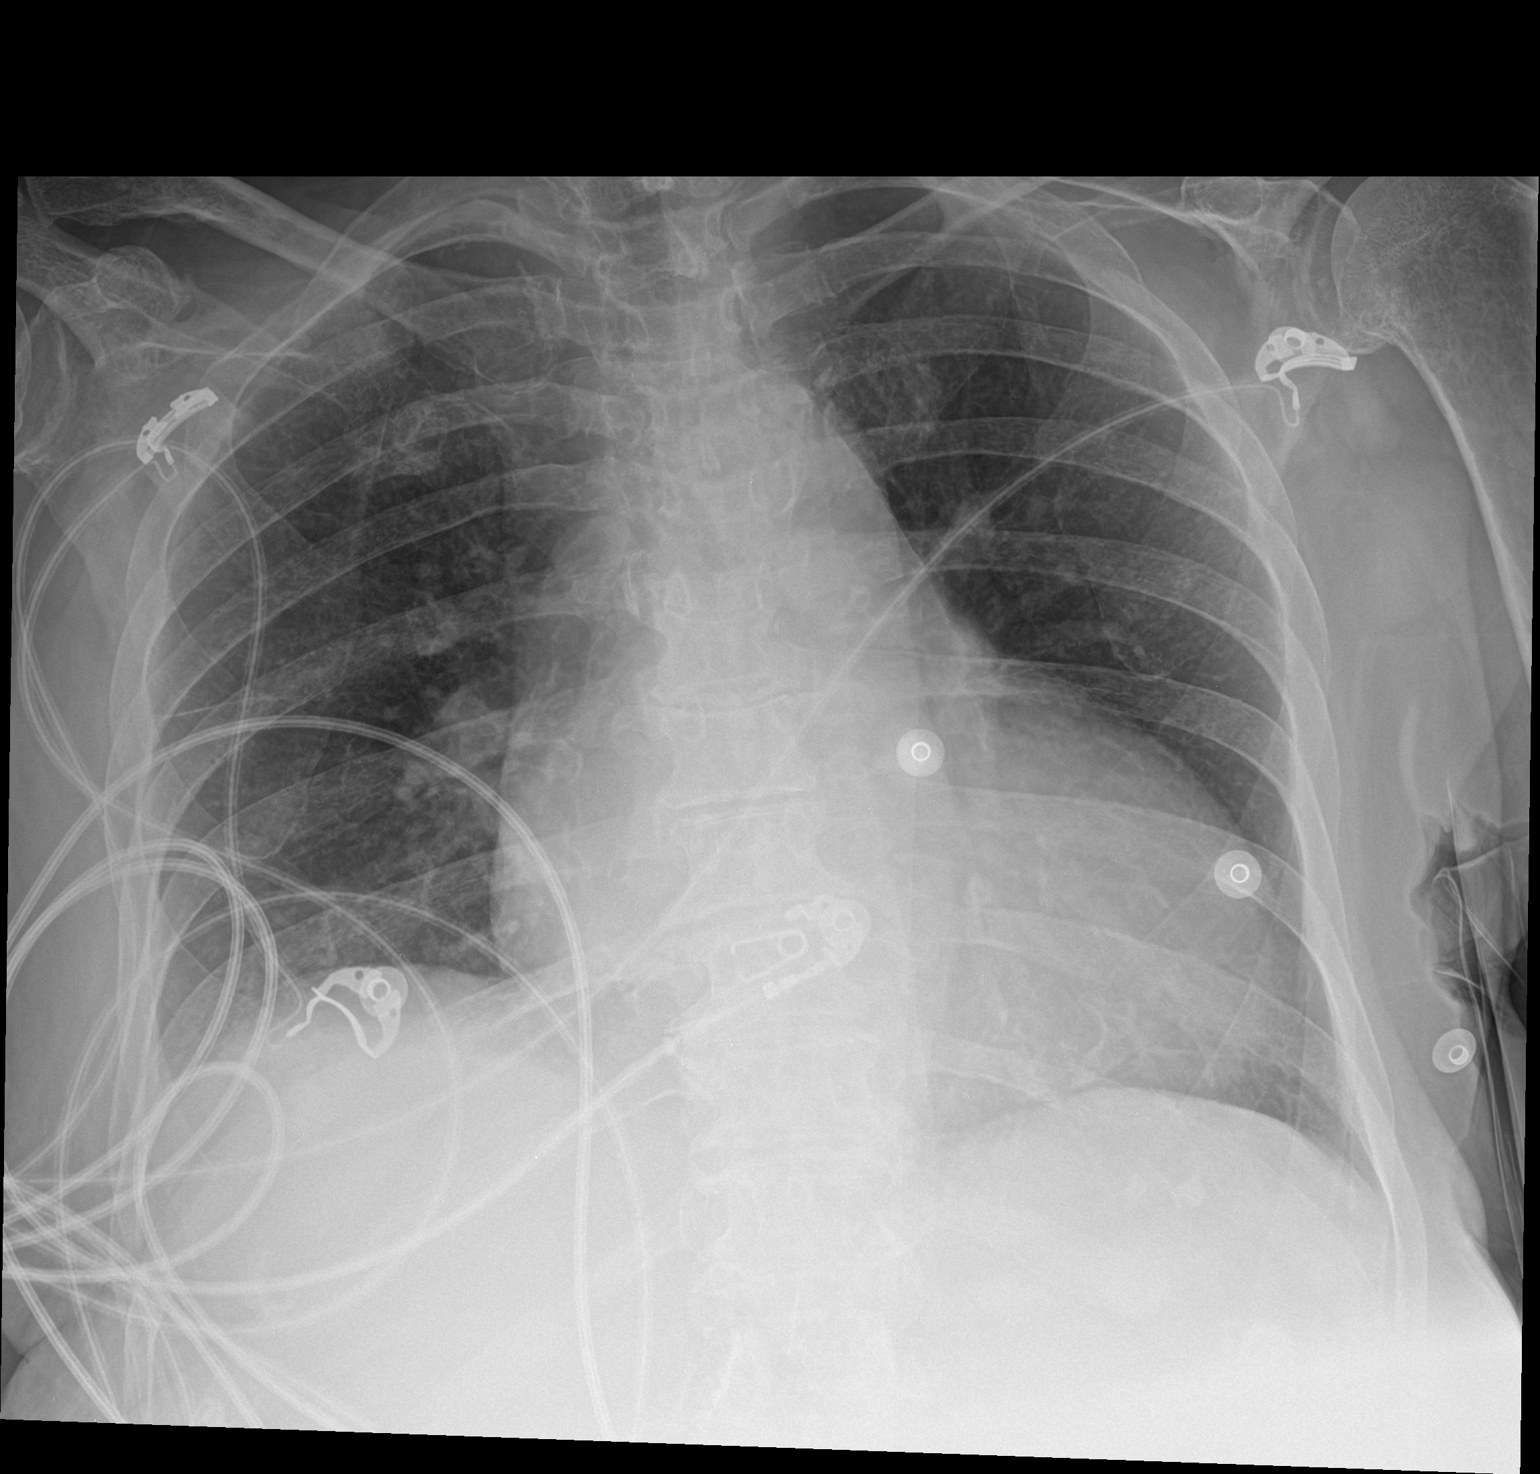

[1 of 1 positions shown; findings below may reference images not displayed]

FINDINGS: Cardiomegaly.  No focal opacity, pleural effusion or pneumothorax.
IMPRESSION: No active disease.  Cardiomegaly.

## 2020-09-11 MED ORDER — ASPIRIN 81 MG PO CHEW: 324.0000 mg | CHEWABLE_TABLET | Freq: Once | ORAL | Status: DC

## 2020-09-11 NOTE — ED Notes (Signed)
Pt still dry. No urine in cannister.

## 2020-09-11 NOTE — ED Notes (Signed)
Dr. Roslynn Amble notified of critical troponin

## 2020-09-11 NOTE — ED Provider Notes (Signed)
[facility] EMERGENCY DEPARTMENT Provider Note   CSN: [id number] Arrival date & time: 09/11/20  1848     History Chief Complaint  Patient presents with   Altered Mental Status    Kristen Gray is a 75 y.o. female.  Presents to ER with concern for altered mental status.  Patient came from [facility] memory care.  Per discussion with sister, patient has a history of dementia.  EMS report that patient has been confused over the past 3 days, seems to be leaning towards the right side more.  Sister reports that she normally has some degree of confusion and at times this is worse.  She is unsure of the details about today's presentation.   Extensive past medical history including history of dementia, A. fib, Crohn's, hypertension, HPI     Past Medical History:  Diagnosis Date   Anemia    Anxiety    Anxiety disorder    Anxiety disorder, unspecified    Arthritis    Atrial fibrillation (HCC)    Chronic fatigue    Chronic fatigue, unspecified    Cognitive communication deficit    Crohn's disease ([place])    Crohn's disease (regional enteritis) ([place])    Dementia ([place])    Depression    Essential hypertension    Gastroesophageal reflux disease without esophagitis    GERD (gastroesophageal reflux disease)    Heart murmur    Hypertension    Insomnia    Insomnia, unspecified    Major depression    Major depressive disorder    Muscle weakness (generalized)    Polyosteoarthritis    Reflux    Repeated falls    Vitamin B12 deficiency (dietary) anemia    Vitamin B12 deficiency anemia, unspecified    Vitamin D deficiency    Vitamin D deficiency, unspecified     Patient Active Problem List   Diagnosis Date Noted   UTI (urinary tract infection) 07/31/2020   Atrial fibrillation with RVR ([place]) 03/05/2020   Atrial fibrillation ([place]) 03/05/2020   AKI (acute kidney injury) ([place])    Moderate episode of recurrent major depressive disorder ([place]) 09/16/2019   Crohn  disease ([place]) 02/25/2019   Chronic fatigue 02/25/2019   Vitamin B 12 deficiency 02/25/2019   Unilateral primary osteoarthritis, left knee 03/27/2018   Hypokalemia 02/20/2018   GERD (gastroesophageal reflux disease) 07/28/2017   Unilateral primary osteoarthritis, right knee 01/31/2017   Sebaceous cyst 10/21/2016   Anemia 10/20/2016   Primary osteoarthritis of right hip 09/12/2016   Pulmonary hypertension ([place]) 07/26/2016   OSA (obstructive sleep apnea) 07/26/2016   Morbid obesity ([place]) 04/14/2016   Hypertension, essential 04/13/2016   Vitamin D deficiency 04/13/2016   Insomnia 04/13/2016   Generalized anxiety disorder 04/13/2016   Major depression 04/13/2016   Osteoarthritis, 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/13/2016   Dyspnea 10/07/2015    Past Surgical History:  Procedure Laterality Date   ANKLE SURGERY     CARDIOVERSION N/A 03/09/2020   Procedure: CARDIOVERSION;  Surgeon: Larey Dresser, MD;  Location: [facility];  Service: Cardiovascular;  Laterality: N/A;   CARDIOVERSION N/A 03/05/2020   Procedure: CARDIOVERSION;  Surgeon: Larey Dresser, MD;  Location: [facility];  Service: Cardiovascular;  Laterality: N/A;   CARPAL TUNNEL RELEASE     CHOLECYSTECTOMY     COLON SURGERY     RIGHT/LEFT HEART CATH AND CORONARY ANGIOGRAPHY N/A 09/15/2016   Procedure: RIGHT/LEFT HEART CATH AND CORONARY ANGIOGRAPHY;  Surgeon: Larey Dresser, MD;  Location: [facility];  Service:  Cardiovascular;  Laterality: N/A;   TEE WITHOUT CARDIOVERSION N/A 03/05/2020   Procedure: TRANSESOPHAGEAL ECHOCARDIOGRAM (TEE);  Surgeon: Larey Dresser, MD;  Location: [facility] ENDOSCOPY;  Service: Cardiovascular;  Laterality: N/A;     OB History   No obstetric history on file.     Family History  Problem Relation Age of Onset   Heart disease Mother        RF   Stroke Father    Diabetes Father    Hyperlipidemia Father    Hypertension Father     Social History   Tobacco Use   Smoking status: Never   Smokeless  tobacco: Never  Vaping Use   Vaping Use: Unknown  Substance Use Topics   Alcohol use: Not Currently   Drug use: Never    Home Medications Prior to Admission medications   Medication Sig Start Date End Date Taking? Authorizing Provider  acetaminophen (TYLENOL) 500 MG tablet Take 1,000 mg by mouth every 6 (six) hours as needed for mild pain.   Yes [provider]  apixaban (ELIQUIS) 5 MG TABS tablet Take 1 tablet (5 mg total) by mouth 2 (two) times daily. 03/09/20  Yes Clegg, Amy D, NP  balsalazide (COLAZAL) 750 MG capsule Take 2 capsules (1,500 mg total) by mouth 2 (two) times daily. 01/30/20  Yes Zehr, Laban Emperor, PA-C  Calcium Carb-Cholecalciferol (CALCIUM-VITAMIN D3) 600-200 MG-UNIT TABS Take 2 tablets by mouth daily.   Yes [provider]  Cholecalciferol (VITAMIN D3) 50 MCG (2000 UT) capsule TAKE 1 CAPSULE BY MOUTH EVERY DAY Patient taking differently: Take 2,000 Units by mouth daily. 01/20/20  Yes [place], Betty G, MD  divalproex (DEPAKOTE SPRINKLE) 125 MG capsule Take 125 mg by mouth 2 (two) times daily.   Yes [provider]  esomeprazole (NEXIUM) 20 MG capsule Take 20 mg by mouth daily.   Yes [provider]  flecainide (TAMBOCOR) 100 MG tablet Take 1 tablet (100 mg total) by mouth every 12 (twelve) hours. 03/09/20  Yes Clegg, Amy D, NP  LORazepam (ATIVAN) 0.5 MG tablet Take 1 tablet (0.5 mg total) by mouth every 12 (twelve) hours as needed for anxiety. 08/03/20  Yes Patrecia Pour, MD  Metoprolol Succinate 50 MG CS24 Take 50 mg by mouth daily. Metoprolol Succinate Capsule ER 24 hour sprinkle 53m. HOLD for HR <60bpm. 08/03/20  Yes GPatrecia Pour MD  sertraline (ZOLOFT) 50 MG tablet Take 50 mg by mouth at bedtime. 05/31/20  Yes [provider]  traZODone (DESYREL) 50 MG tablet Take 50 mg by mouth at bedtime.   Yes [provider]  Venlafaxine HCl 75 MG TB24 Take 75 mg by mouth every other day.   Yes [provider]  vitamin B-12  (CYANOCOBALAMIN) 1000 MCG tablet Take 1 tablet (1,000 mcg total) by mouth daily. 01/20/20  Yes JMartinique Betty G, MD  Zinc Oxide 22 % CREA Apply 1 application topically 3 (three) times daily. Red rash on buttocks   Yes [provider]    Allergies    Penicillin g  Review of Systems   Review of Systems  Unable to perform ROS: Dementia   Physical Exam Updated Vital Signs BP (!) 119/42   Pulse (!) 47   Temp 97.6 F (36.4 C) (Axillary)   Resp 15   SpO2 98%   Physical Exam Vitals and nursing note reviewed.  Constitutional:      General: She is not in acute distress.    Appearance: She is well-developed.  Comments: Chronic ill-appearing but in no acute distress  HENT:     Head: Normocephalic and atraumatic.  Eyes:     Conjunctiva/sclera: Conjunctivae normal.  Cardiovascular:     Rate and Rhythm: Normal rate and regular rhythm.     Heart sounds: No murmur heard. Pulmonary:     Effort: Pulmonary effort is normal. No respiratory distress.     Breath sounds: Normal breath sounds.  Abdominal:     Palpations: Abdomen is soft.     Tenderness: There is no abdominal tenderness.  Musculoskeletal:        General: No deformity or signs of injury.     Cervical back: Neck supple.     Comments: There is bilateral lower extremity lower pitting edema, somewhat difficult to obtain DP/PT pulses by palpation but readily dopplered bilaterally  Skin:    General: Skin is warm and dry.  Neurological:     General: No focal deficit present.     Mental Status: She is alert.     Comments: Alert, answers some basic questions, oriented to person but not place or time, follows basic commands, blink to threat intact bilaterally    ED Results / Procedures / Treatments   Labs (all labs ordered are listed, but only abnormal results are displayed) Labs Reviewed  CBC WITH DIFFERENTIAL/PLATELET - Abnormal; Notable for the following components:      Result Value   WBC 14.4 (*)    RBC 3.78 (*)     Hemoglobin 9.6 (*)    HCT 31.6 (*)    MCH 25.4 (*)    RDW 17.3 (*)    Neutro Abs 11.9 (*)    Abs Immature Granulocytes 0.08 (*)    All other components within normal limits  COMPREHENSIVE METABOLIC PANEL - Abnormal; Notable for the following components:   Sodium 131 (*)    Chloride 97 (*)    Glucose, Bld 141 (*)    BUN 27 (*)    Creatinine, Ser 1.36 (*)    Calcium 8.5 (*)    Total Protein 5.9 (*)    Albumin 2.6 (*)    AST 93 (*)    GFR, Estimated 40 (*)    All other components within normal limits  TROPONIN I (HIGH SENSITIVITY) - Abnormal; Notable for the following components:   Troponin I (High Sensitivity) 147 (*)    All other components within normal limits  TROPONIN I (HIGH SENSITIVITY) - Abnormal; Notable for the following components:   Troponin I (High Sensitivity) 134 (*)    All other components within normal limits  SARS CORONAVIRUS 2 (TAT 6-24 HRS)  BRAIN NATRIURETIC PEPTIDE  URINALYSIS, ROUTINE W REFLEX MICROSCOPIC    EKG EKG Interpretation  Date/Time:  Friday September 11 2020 19:12:34 EDT Ventricular Rate:  54 PR Interval:  250 QRS Duration: 120 QT Interval:  547 QTC Calculation: 519 R Axis:   103 Text Interpretation: Sinus rhythm Prolonged PR interval Consider left atrial enlargement Nonspecific intraventricular conduction delay Repol abnrm, prob ischemia, anterolateral lds Confirmed by Madalyn Rob [phone]) on 09/11/2020 7:24:16 PM  Radiology CT HEAD WO CONTRAST (5MM)  Result Date: 09/11/2020 CLINICAL DATA:  Altered mental status. EXAM: CT HEAD WITHOUT CONTRAST TECHNIQUE: Contiguous axial images were obtained from the base of the skull through the vertex without intravenous contrast. COMPARISON:  Head CT dated 08/30/2020. FINDINGS: Brain: There is mild age-related atrophy and chronic microvascular ischemic changes. There is no acute intracranial hemorrhage. No mass effect or midline shift. No extra-axial fluid  collection. Vascular: No hyperdense vessel or  unexpected calcification. Skull: Normal. Negative for fracture or focal lesion. Sinuses/Orbits: No acute finding. Other: None IMPRESSION: 1. No acute intracranial pathology. 2. Mild age-related atrophy and chronic microvascular ischemic changes. Electronically Signed   By: Anner Crete M.D.   On: 09/11/2020 20:35   DG Chest Portable 1 View  Result Date: 09/11/2020 CLINICAL DATA:  Chest pain EXAM: PORTABLE CHEST 1 VIEW COMPARISON:  08/30/2020 FINDINGS: Cardiomegaly.  No focal opacity, pleural effusion or pneumothorax. IMPRESSION: No active disease.  Cardiomegaly. Electronically Signed   By: Donavan Foil M.D.   On: 09/11/2020 21:01    Procedures .Critical Care  Date/Time: 09/11/2020 11:53 PM Performed by: Lucrezia Starch, MD Authorized by: Lucrezia Starch, MD   Critical care provider statement:    Critical care time (minutes):  45   Critical care was necessary to treat or prevent imminent or life-threatening deterioration of the following conditions:  Cardiac failure   Critical care was time spent personally by me on the following activities:  Discussions with consultants, evaluation of patient's response to treatment, examination of patient, ordering and performing treatments and interventions, ordering and review of laboratory studies, ordering and review of radiographic studies, pulse oximetry, re-evaluation of patient's condition, obtaining history from patient or surrogate and review of old charts   Medications Ordered in ED Medications  aspirin chewable tablet 324 mg (has no administration in time range)    ED Course  I have reviewed the triage vital signs and the nursing notes.  Pertinent labs & imaging results that were available during my care of the patient were reviewed by me and considered in my medical decision making (see chart for details).    MDM Rules/Calculators/A&P                           75 year old lady presents to ER with concern for altered mental  status for the past few days.  On exam patient is alert, seems confused but not in acute distress.  Obtain broad work-up.  Basic labs noted for leukocytosis.  No fever or obvious signs of infection.  Will check chest x-ray and urinalysis.  CXR negative.  EKG with new deep T wave inversion in multiple leads concerning for ischemia.  Initial troponin was elevated, repeat stable.  Consulted cardiology, Conley Canal.  He will come evaluate patient.  Recommends aspirin for now.  Will admit to medicine for further management.  Will also check MRI to rule out acute stroke.  TRH to admit.  Final Clinical Impression(s) / ED Diagnoses Final diagnoses:  Altered mental status, unspecified altered mental status type  NSTEMI (non-ST elevated myocardial infarction) (HCC)  Leukocytosis, unspecified type    Rx / DC Orders ED Discharge Orders     None        Lucrezia Starch, MD 09/11/20 2353

## 2020-09-11 NOTE — Consult Note (Signed)
Cardiology Consultation:   Patient ID: Kristen Gray MRN: [id number]; DOB: 1945-07-22  Admit date: 09/11/2020 Date of Consult: 09/11/2020  PCP:  Garwin Brothers, MD   [facility] Providers Cardiologist:  None        Patient Profile:   Kristen Gray is a 75 y.o. female with a hx of dementia currently residing in the memory unit, Crohn's disease, HTN, HLD, atrial fibrillation, OA, and OSA who is being seen 09/11/2020 for the evaluation of elevated troponins at the request of Dr. Roslynn Amble.  History of Present Illness:   [name] is completely altered and is not able to offer any history.  The history available in this note is from chart review and speaking with the ED provider.  Per the ED provider, the patient has had AMS for the past 3 days that has worsened today.  At baseline she is communicative, but does have dementia so is not always oriented.  She is currently ANO x0 and is unable to participate in the examination.  She lives in a memory care facility but was transferred here due to her AMS.  She is unable to answer ROS questions.  In the ED her VS were afebrile, HR 54, BP 142/57, RR 18 and satting 99% on RA.  Labs notable for WBC 14.4, hemoglobin 9.6, sodium 131, potassium 3.9, BUN 27 creatinine 1.36, troponin 147 -> 134 1 hour.  An ECG was obtained which showed new deep T wave inversions in multiple leads.  CT brain was negative for intracranial process.  CXR showed cardiomegaly but no infiltrates.  Given the elevated troponin and EKG changes cardiology is consulted.   Past Medical History:  Diagnosis Date   Anemia    Anxiety    Anxiety disorder    Anxiety disorder, unspecified    Arthritis    Atrial fibrillation (HCC)    Chronic fatigue    Chronic fatigue, unspecified    Cognitive communication deficit    Crohn's disease ([place])    Crohn's disease (regional enteritis) ([place])    Dementia ([place])    Depression    Essential hypertension    Gastroesophageal reflux disease  without esophagitis    GERD (gastroesophageal reflux disease)    Heart murmur    Hypertension    Insomnia    Insomnia, unspecified    Major depression    Major depressive disorder    Muscle weakness (generalized)    Polyosteoarthritis    Reflux    Repeated falls    Vitamin B12 deficiency (dietary) anemia    Vitamin B12 deficiency anemia, unspecified    Vitamin D deficiency    Vitamin D deficiency, unspecified     Past Surgical History:  Procedure Laterality Date   ANKLE SURGERY     CARDIOVERSION N/A 03/09/2020   Procedure: CARDIOVERSION;  Surgeon: Larey Dresser, MD;  Location: [facility] ENDOSCOPY;  Service: Cardiovascular;  Laterality: N/A;   CARDIOVERSION N/A 03/05/2020   Procedure: CARDIOVERSION;  Surgeon: Larey Dresser, MD;  Location: [place];  Service: Cardiovascular;  Laterality: N/A;   CARPAL TUNNEL RELEASE     CHOLECYSTECTOMY     COLON SURGERY     RIGHT/LEFT HEART CATH AND CORONARY ANGIOGRAPHY N/A 09/15/2016   Procedure: RIGHT/LEFT HEART CATH AND CORONARY ANGIOGRAPHY;  Surgeon: Larey Dresser, MD;  Location: [facility] CV LAB;  Service: Cardiovascular;  Laterality: N/A;   TEE WITHOUT CARDIOVERSION N/A 03/05/2020   Procedure: TRANSESOPHAGEAL ECHOCARDIOGRAM (TEE);  Surgeon: Larey Dresser, MD;  Location: [facility]  ENDOSCOPY;  Service: Cardiovascular;  Laterality: N/A;     Home Medications:  Prior to Admission medications   Medication Sig Start Date End Date Taking? Authorizing Provider  acetaminophen (TYLENOL) 500 MG tablet Take 1,000 mg by mouth every 6 (six) hours as needed for mild pain.   Yes [provider]  apixaban (ELIQUIS) 5 MG TABS tablet Take 1 tablet (5 mg total) by mouth 2 (two) times daily. 03/09/20  Yes Clegg, Amy D, NP  balsalazide (COLAZAL) 750 MG capsule Take 2 capsules (1,500 mg total) by mouth 2 (two) times daily. 01/30/20  Yes Zehr, Laban Emperor, PA-C  Calcium Carb-Cholecalciferol (CALCIUM-VITAMIN D3) 600-200 MG-UNIT TABS Take 2 tablets by mouth  daily.   Yes [provider]  Cholecalciferol (VITAMIN D3) 50 MCG (2000 UT) capsule TAKE 1 CAPSULE BY MOUTH EVERY DAY Patient taking differently: Take 2,000 Units by mouth daily. 01/20/20  Yes Martinique, Betty G, MD  divalproex (DEPAKOTE SPRINKLE) 125 MG capsule Take 125 mg by mouth 2 (two) times daily.   Yes [provider]  esomeprazole (NEXIUM) 20 MG capsule Take 20 mg by mouth daily.   Yes [provider]  flecainide (TAMBOCOR) 100 MG tablet Take 1 tablet (100 mg total) by mouth every 12 (twelve) hours. 03/09/20  Yes Clegg, Amy D, NP  LORazepam (ATIVAN) 0.5 MG tablet Take 1 tablet (0.5 mg total) by mouth every 12 (twelve) hours as needed for anxiety. 08/03/20  Yes Patrecia Pour, MD  Metoprolol Succinate 50 MG CS24 Take 50 mg by mouth daily. Metoprolol Succinate Capsule ER 24 hour sprinkle 76m. HOLD for HR <60bpm. 08/03/20  Yes GPatrecia Pour MD  sertraline (ZOLOFT) 50 MG tablet Take 50 mg by mouth at bedtime. 05/31/20  Yes [provider]  traZODone (DESYREL) 50 MG tablet Take 50 mg by mouth at bedtime.   Yes [provider]  Venlafaxine HCl 75 MG TB24 Take 75 mg by mouth every other day.   Yes [provider]  vitamin B-12 (CYANOCOBALAMIN) 1000 MCG tablet Take 1 tablet (1,000 mcg total) by mouth daily. 01/20/20  Yes JMartinique Betty G, MD  Zinc Oxide 22 % CREA Apply 1 application topically 3 (three) times daily. Red rash on buttocks   Yes [provider]    Inpatient Medications: Scheduled Meds:  aspirin  324 mg Oral Once   Continuous Infusions:  PRN Meds:   Allergies:    Allergies  Allergen Reactions   Penicillin G Nausea Only    Has patient had a PCN reaction causing immediate rash, facial/tongue/throat swelling, SOB or lightheadedness with hypotension:No Has patient had a PCN reaction causing severe rash involving mucus membranes or skin necrosis: No Has patient had a PCN reaction that required hospitalization: No Has patient  had a PCN reaction occurring within the last 10 years: No--NAUSEA ONLY If all of the above answers are "NO", then may proceed with Cephalosporin use.     Social History:   Social History   Socioeconomic History   Marital status: Single    Spouse name: Not on file   Number of children: Not on file   Years of education: Not on file   Highest education level: Not on file  Occupational History   Occupation: retired  Tobacco Use   Smoking status: Never   Smokeless tobacco: Never  Vaping Use   Vaping Use: Unknown  Substance and Sexual Activity   Alcohol use: Not Currently   Drug use: Never   Sexual activity: Not  Currently  Other Topics Concern   Not on file  Social History Narrative   ** Merged History Encounter **       ** Merged History Encounter **       ** Merged History Encounter **       ** Merged History Encounter **       Social Determinants of Radio broadcast assistant Strain: Not on file  Food Insecurity: Not on file  Transportation Needs: Not on file  Physical Activity: Not on file  Stress: Not on file  Social Connections: Not on file  Intimate Partner Violence: Not on file    Family History:    Family History  Problem Relation Age of Onset   Heart disease Mother        RF   Stroke Father    Diabetes Father    Hyperlipidemia Father    Hypertension Father      ROS:  Please see the history of present illness.  Patient is unable to participate in ROS questioning.  Physical Exam/Data:   Vitals:   09/11/20 2200 09/11/20 2223 09/11/20 2245 09/11/20 2330  BP: (!) 116/52 (!) 113/48 (!) 120/44 (!) 119/42  Pulse: (!) 49 (!) 48 (!) 49 (!) 47  Resp: 19 13 15 15   Temp:      TempSrc:      SpO2: 98% 100% 96% 98%   No intake or output data in the 24 hours ending 09/11/20 2358 Last 3 Weights 08/26/2020 08/01/2020 05/29/2020  Weight (lbs) 140 lb 160 lb 15 oz 140 lb  Weight (kg) 63.504 kg 73 kg 63.504 kg     There is no height or weight on file to calculate  BMI.  General: Chronically ill-appearing female who is borderline obtunded, slouched over in gurney HEENT: Pupils equal round and reactive to light, keeps eyes closed Neck: Unable to assess JVD Vascular: 2+ radial pulses bilaterally Cardiac: Bradycardic with regular rhythm, normal S1 and S2, no M/R/G Lungs:  clear to auscultation bilaterally on anterior auscultation, no wheezing, rhonchi or rales  Abd: soft and nondistended, mild generalized tenderness to palpation throughout the abdomen Ext: 2+ pitting edema bilaterally, mild erythema overlying the dorsal surface of the left foot Musculoskeletal:  No deformities, spontaneously moves extremities Skin: warm and dry  Neuro: Unable to assess, ANO x0, not following commands Psych: Borderline obtunded  EKG:  The EKG was personally reviewed and demonstrates:      Telemetry:  Telemetry was personally reviewed and demonstrates:  Sinus bradycardia with TWI  Relevant CV Studies:  TEE 03/05/20:  IMPRESSIONS     1. Left ventricular ejection fraction, by estimation, is 50 to 55%. The  left ventricle has low normal function. The left ventricle has no regional  wall motion abnormalities.   2. Right ventricular systolic function is normal. The right ventricular  size is normal.   3. Left atrial size was moderately dilated. No left atrial/left atrial  appendage thrombus was detected.   4. Right atrial size was mildly dilated.   5. Moderate TR, peak RV-RA gradient 20 mmHg.   6. No PFO or ASD by color doppler.   7. The mitral valve is abnormal. Moderate mitral valve regurgitation. No  evidence of mitral stenosis.   8. The aortic valve is tricuspid. Aortic valve regurgitation is mild. No  aortic stenosis is present.   9. Normal caliber descending thoracic aorta with minimal plaque.   TTE 03/05/20:  IMPRESSIONS     1. Left ventricular  ejection fraction, by estimation, is 50%. The left  ventricle has mildly decreased function. The left  ventricle demonstrates  global hypokinesis. Left ventricular diastolic parameters are  indeterminate.   2. Right ventricular systolic function is normal. The right ventricular  size is normal. There is moderately elevated pulmonary artery systolic  pressure. The estimated right ventricular systolic pressure is 82.5 mmHg.   3. Left atrial size was mildly dilated.   4. A small pericardial effusion is present.   5. The mitral valve is normal in structure. Moderate mitral valve  regurgitation. No evidence of mitral stenosis.   6. Tricuspid valve regurgitation is moderate.   7. The aortic valve is tricuspid. Aortic valve regurgitation is mild. No  aortic stenosis is present.   8. The inferior vena cava is normal in size with greater than 50%  respiratory variability, suggesting right atrial pressure of 3 mmHg.   9. The patient was in atrial fibrillation.     LHC 09/15/2016:  1. Borderline elevated left heart filling pressure with normal RV filling pressure.  2. Borderline pulmonary hypertension.  3. Normal coronaries.    Laboratory Data:  High Sensitivity Troponin:   Recent Labs  Lab 09/11/20 1925 09/11/20 2125  TROPONINIHS 147* 134*     Chemistry Recent Labs  Lab 09/11/20 1925  NA 131*  K 3.9  CL 97*  CO2 22  GLUCOSE 141*  BUN 27*  CREATININE 1.36*  CALCIUM 8.5*  GFRNONAA 40*  ANIONGAP 12    Recent Labs  Lab 09/11/20 1925  PROT 5.9*  ALBUMIN 2.6*  AST 93*  ALT 13  ALKPHOS 105  BILITOT 1.0   Hematology Recent Labs  Lab 09/11/20 1925  WBC 14.4*  RBC 3.78*  HGB 9.6*  HCT 31.6*  MCV 83.6  MCH 25.4*  MCHC 30.4  RDW 17.3*  PLT 223   BNPNo results for input(s): BNP, PROBNP in the last 168 hours.  DDimer No results for input(s): DDIMER in the last 168 hours.   Radiology/Studies:  CT HEAD WO CONTRAST (5MM)  Result Date: 09/11/2020 CLINICAL DATA:  Altered mental status. EXAM: CT HEAD WITHOUT CONTRAST TECHNIQUE: Contiguous axial images were obtained  from the base of the skull through the vertex without intravenous contrast. COMPARISON:  Head CT dated 08/30/2020. FINDINGS: Brain: There is mild age-related atrophy and chronic microvascular ischemic changes. There is no acute intracranial hemorrhage. No mass effect or midline shift. No extra-axial fluid collection. Vascular: No hyperdense vessel or unexpected calcification. Skull: Normal. Negative for fracture or focal lesion. Sinuses/Orbits: No acute finding. Other: None IMPRESSION: 1. No acute intracranial pathology. 2. Mild age-related atrophy and chronic microvascular ischemic changes. Electronically Signed   By: Anner Crete M.D.   On: 09/11/2020 20:35   DG Chest Portable 1 View  Result Date: 09/11/2020 CLINICAL DATA:  Chest pain EXAM: PORTABLE CHEST 1 VIEW COMPARISON:  08/30/2020 FINDINGS: Cardiomegaly.  No focal opacity, pleural effusion or pneumothorax. IMPRESSION: No active disease.  Cardiomegaly. Electronically Signed   By: Donavan Foil M.D.   On: 09/11/2020 21:01     Assessment and Plan:   Kristen Gray is a 75 y.o. female with a hx of dementia currently residing in the memory unit, Crohn's disease, HTN, HLD, atrial fibrillation, OA, and OSA who is being seen 09/11/2020 for the evaluation of elevated troponins at the request of Dr. Roslynn Amble.  #Suspected Type I vs II NSTEMI vs Chronically Elevated Troponins #Subacute Encephalopathy :: Patient presenting with AMS, elevated troponins, and new marked  T wave inversions on EKG in leads II, III, aVF, V3-V6.  This constellation of symptoms certainly concerning for ACS.  The challenging thing here is that the patient is unable to answer any questions and thus it is unclear if she has symptomatology consistent with ACS.  What further makes this challenging is that she has had elevated troponins every time they have ever been checked in the past but had clean coronaries back in 2018.  My POCUS was limited; however, I did not appreciate any focal  wall motion abnormalities.  Given the degree of uncertainty here and her elevated TIMI risk score of 4, I would treat this as ACS until proven otherwise.  There certainly may be other reasons for her AMS and that he should be evaluated as well.  1 thing to consider on the differential are T wave inversions secondary to an intracranial process.  We will see what the MRI shows.  But for now we will treat this as an NSTEMI and discussed with family the utility of a catheterization in a woman with her comorbid conditions. -Give 325 mg ASA rectally -Hold apixaban -Start heparin gtt 12 hours after apixaban washout -Order TTE to assess for WMAs -Start atorvastatin 40 mg once safe from a swallowing standpoint -Hold home metoprolol for bradycardia -Please check TFTs, blood cultures, and urine studies -Work-up other causes for AMS, follow-up MRI  #Atrial Fibrillation :: Currently bradycardic, rate controlled.  Holding home metoprolol for bradycardia and apixaban for heparin -Maintain telemetry    Risk Assessment/Risk Scores:     TIMI Risk Score for Unstable Angina or Non-ST Elevation MI:   The patient's TIMI risk score is 4, which indicates a 20% risk of all cause mortality, new or recurrent myocardial infarction or need for urgent revascularization in the next 14 days.    CHA2DS2-VASc Score = 4  This indicates a 4.8% annual risk of stroke. The patient's score is based upon: CHF History: No HTN History: Yes Diabetes History: No Stroke History: No Vascular Disease History: No Age Score: 2 Gender Score: 1         For questions or updates, please contact [place] Please consult [email or web address] for contact info under    Signed, Hershal Coria, MD  09/11/2020 11:58 PM

## 2020-09-11 NOTE — ED Triage Notes (Signed)
Pt comes from [facility] via EMS. Staff reports AMS x3 days, leaning to the right x 3 days and are concerned about a stroke. However staff has continued to give pt meds and food. EMS reports that staff has not contacted family as of yet, pt is full code. EMS reports will not open eyes or respond verbally in any way or follow commands. Pt made sound with IV attempt but did not withdraw from pain.  BP 150/70 HR 54 RR 18 O2 97% RA CBG 172 22g IV in R Hand

## 2020-09-11 NOTE — ED Notes (Signed)
MD at bedside. 

## 2020-09-11 NOTE — ED Notes (Signed)
purwick placed on patient

## 2020-09-12 ENCOUNTER — Inpatient Hospital Stay (HOSPITAL_COMMUNITY): Payer: PPO

## 2020-09-12 DIAGNOSIS — E871 Hypo-osmolality and hyponatremia: Secondary | ICD-10-CM

## 2020-09-12 DIAGNOSIS — I480 Paroxysmal atrial fibrillation: Secondary | ICD-10-CM | POA: Diagnosis present

## 2020-09-12 DIAGNOSIS — I482 Chronic atrial fibrillation, unspecified: Secondary | ICD-10-CM

## 2020-09-12 DIAGNOSIS — Z515 Encounter for palliative care: Secondary | ICD-10-CM

## 2020-09-12 DIAGNOSIS — I10 Essential (primary) hypertension: Secondary | ICD-10-CM

## 2020-09-12 DIAGNOSIS — F0390 Unspecified dementia without behavioral disturbance: Secondary | ICD-10-CM | POA: Diagnosis present

## 2020-09-12 DIAGNOSIS — Z66 Do not resuscitate: Secondary | ICD-10-CM | POA: Diagnosis not present

## 2020-09-12 DIAGNOSIS — N1832 Chronic kidney disease, stage 3b: Secondary | ICD-10-CM

## 2020-09-12 DIAGNOSIS — I130 Hypertensive heart and chronic kidney disease with heart failure and stage 1 through stage 4 chronic kidney disease, or unspecified chronic kidney disease: Secondary | ICD-10-CM | POA: Diagnosis present

## 2020-09-12 DIAGNOSIS — L89890 Pressure ulcer of other site, unstageable: Secondary | ICD-10-CM | POA: Diagnosis present

## 2020-09-12 DIAGNOSIS — D72829 Elevated white blood cell count, unspecified: Secondary | ICD-10-CM | POA: Diagnosis present

## 2020-09-12 DIAGNOSIS — I214 Non-ST elevation (NSTEMI) myocardial infarction: Principal | ICD-10-CM

## 2020-09-12 DIAGNOSIS — G9341 Metabolic encephalopathy: Secondary | ICD-10-CM | POA: Diagnosis present

## 2020-09-12 DIAGNOSIS — I083 Combined rheumatic disorders of mitral, aortic and tricuspid valves: Secondary | ICD-10-CM | POA: Diagnosis present

## 2020-09-12 DIAGNOSIS — I272 Pulmonary hypertension, unspecified: Secondary | ICD-10-CM | POA: Diagnosis present

## 2020-09-12 DIAGNOSIS — R7401 Elevation of levels of liver transaminase levels: Secondary | ICD-10-CM

## 2020-09-12 DIAGNOSIS — E785 Hyperlipidemia, unspecified: Secondary | ICD-10-CM | POA: Diagnosis present

## 2020-09-12 DIAGNOSIS — I6340 Cerebral infarction due to embolism of unspecified cerebral artery: Secondary | ICD-10-CM | POA: Insufficient documentation

## 2020-09-12 DIAGNOSIS — E782 Mixed hyperlipidemia: Secondary | ICD-10-CM

## 2020-09-12 DIAGNOSIS — I5043 Acute on chronic combined systolic (congestive) and diastolic (congestive) heart failure: Secondary | ICD-10-CM | POA: Diagnosis not present

## 2020-09-12 DIAGNOSIS — K5090 Crohn's disease, unspecified, without complications: Secondary | ICD-10-CM | POA: Diagnosis not present

## 2020-09-12 DIAGNOSIS — R410 Disorientation, unspecified: Secondary | ICD-10-CM | POA: Diagnosis not present

## 2020-09-12 DIAGNOSIS — I639 Cerebral infarction, unspecified: Secondary | ICD-10-CM | POA: Diagnosis not present

## 2020-09-12 DIAGNOSIS — I63413 Cerebral infarction due to embolism of bilateral middle cerebral arteries: Secondary | ICD-10-CM | POA: Diagnosis present

## 2020-09-12 DIAGNOSIS — R4182 Altered mental status, unspecified: Secondary | ICD-10-CM

## 2020-09-12 DIAGNOSIS — E8809 Other disorders of plasma-protein metabolism, not elsewhere classified: Secondary | ICD-10-CM

## 2020-09-12 DIAGNOSIS — F329 Major depressive disorder, single episode, unspecified: Secondary | ICD-10-CM | POA: Diagnosis present

## 2020-09-12 DIAGNOSIS — E1122 Type 2 diabetes mellitus with diabetic chronic kidney disease: Secondary | ICD-10-CM | POA: Diagnosis present

## 2020-09-12 DIAGNOSIS — G4733 Obstructive sleep apnea (adult) (pediatric): Secondary | ICD-10-CM | POA: Diagnosis present

## 2020-09-12 DIAGNOSIS — R7989 Other specified abnormal findings of blood chemistry: Secondary | ICD-10-CM

## 2020-09-12 DIAGNOSIS — D649 Anemia, unspecified: Secondary | ICD-10-CM | POA: Diagnosis present

## 2020-09-12 DIAGNOSIS — R9431 Abnormal electrocardiogram [ECG] [EKG]: Secondary | ICD-10-CM

## 2020-09-12 DIAGNOSIS — R778 Other specified abnormalities of plasma proteins: Secondary | ICD-10-CM

## 2020-09-12 DIAGNOSIS — I5042 Chronic combined systolic (congestive) and diastolic (congestive) heart failure: Secondary | ICD-10-CM | POA: Diagnosis present

## 2020-09-12 DIAGNOSIS — Z20822 Contact with and (suspected) exposure to covid-19: Secondary | ICD-10-CM | POA: Diagnosis present

## 2020-09-12 DIAGNOSIS — N189 Chronic kidney disease, unspecified: Secondary | ICD-10-CM

## 2020-09-12 DIAGNOSIS — Z7189 Other specified counseling: Secondary | ICD-10-CM

## 2020-09-12 LAB — PROTIME-INR
INR: 2.7 — ABNORMAL HIGH (ref 0.8–1.2)
Prothrombin Time: 28.6 seconds — ABNORMAL HIGH (ref 11.4–15.2)

## 2020-09-12 LAB — COMPREHENSIVE METABOLIC PANEL
ALT: 14 U/L (ref 0–44)
AST: 73 U/L — ABNORMAL HIGH (ref 15–41)
Albumin: 2.5 g/dL — ABNORMAL LOW (ref 3.5–5.0)
Alkaline Phosphatase: 104 U/L (ref 38–126)
Anion gap: 10 (ref 5–15)
BUN: 27 mg/dL — ABNORMAL HIGH (ref 8–23)
CO2: 24 mmol/L (ref 22–32)
Calcium: 8.5 mg/dL — ABNORMAL LOW (ref 8.9–10.3)
Chloride: 99 mmol/L (ref 98–111)
Creatinine, Ser: 1.32 mg/dL — ABNORMAL HIGH (ref 0.44–1.00)
GFR, Estimated: 42 mL/min — ABNORMAL LOW (ref >60)
Glucose, Bld: 114 mg/dL — ABNORMAL HIGH (ref 70–99)
Potassium: 3.7 mmol/L (ref 3.5–5.1)
Sodium: 133 mmol/L — ABNORMAL LOW (ref 135–145)
Total Bilirubin: 1.3 mg/dL — ABNORMAL HIGH (ref 0.3–1.2)
Total Protein: 5.6 g/dL — ABNORMAL LOW (ref 6.5–8.1)

## 2020-09-12 LAB — URINALYSIS, ROUTINE W REFLEX MICROSCOPIC
Bilirubin Urine: NEGATIVE
Glucose, UA: NEGATIVE mg/dL
Hgb urine dipstick: NEGATIVE
Ketones, ur: NEGATIVE mg/dL
Leukocytes,Ua: NEGATIVE
Nitrite: NEGATIVE
Protein, ur: NEGATIVE mg/dL
Specific Gravity, Urine: 1.027 (ref 1.005–1.030)
pH: 5 (ref 5.0–8.0)

## 2020-09-12 LAB — ECHOCARDIOGRAM COMPLETE
AR max vel: 0.91 cm2
AV Area VTI: 0.87 cm2
AV Area mean vel: 0.86 cm2
AV Mean grad: 5 mmHg
AV Peak grad: 10.2 mmHg
Ao pk vel: 1.6 m/s
Area-P 1/2: 1.68 cm2
MV M vel: 6.07 m/s
MV Peak grad: 147.4 mmHg
P 1/2 time: 543 msec
Radius: 0.6 cm
S' Lateral: 3 cm
Weight: 2380.97 oz

## 2020-09-12 LAB — CBC
HCT: 29.5 % — ABNORMAL LOW (ref 36.0–46.0)
Hemoglobin: 9 g/dL — ABNORMAL LOW (ref 12.0–15.0)
MCH: 25.3 pg — ABNORMAL LOW (ref 26.0–34.0)
MCHC: 30.5 g/dL (ref 30.0–36.0)
MCV: 82.9 fL (ref 80.0–100.0)
Platelets: 206 10*3/uL (ref 150–400)
RBC: 3.56 MIL/uL — ABNORMAL LOW (ref 3.87–5.11)
RDW: 17.5 % — ABNORMAL HIGH (ref 11.5–15.5)
WBC: 11.1 10*3/uL — ABNORMAL HIGH (ref 4.0–10.5)
nRBC: 0 % (ref 0.0–0.2)

## 2020-09-12 LAB — APTT
aPTT: 41 seconds — ABNORMAL HIGH (ref 24–36)
aPTT: 174 seconds (ref 24–36)

## 2020-09-12 LAB — HEPARIN LEVEL (UNFRACTIONATED): Heparin Unfractionated: >1.1 IU/mL — ABNORMAL HIGH (ref 0.30–0.70)

## 2020-09-12 LAB — MAGNESIUM: Magnesium: 1.6 mg/dL — ABNORMAL LOW (ref 1.7–2.4)

## 2020-09-12 LAB — SARS CORONAVIRUS 2 (TAT 6-24 HRS): SARS Coronavirus 2: NEGATIVE

## 2020-09-12 LAB — PHOSPHORUS: Phosphorus: 4 mg/dL (ref 2.5–4.6)

## 2020-09-12 LAB — TSH: TSH: 2.956 u[IU]/mL (ref 0.350–4.500)

## 2020-09-12 LAB — BRAIN NATRIURETIC PEPTIDE: B Natriuretic Peptide: 1836.5 pg/mL — ABNORMAL HIGH (ref 0.0–100.0)

## 2020-09-12 IMAGING — MR MR MRA HEAD W/O CM
1 series · 20 of 48 positions shown · non-contrast
Comparison: No pertinent prior exam.

CLINICAL DATA: Small acute strokes on MRI brain

EXAM:
MRA HEAD WITHOUT CONTRAST
TECHNIQUE: Angiographic images of the Circle of Willis were acquired using MRA
technique without intravenous contrast.

[Series 2: ax (id) · axial · 1.0mm · 0.43mm/px · z∈[-87,-5]mm · 20 of 176 slices shown]
[im 1/176]
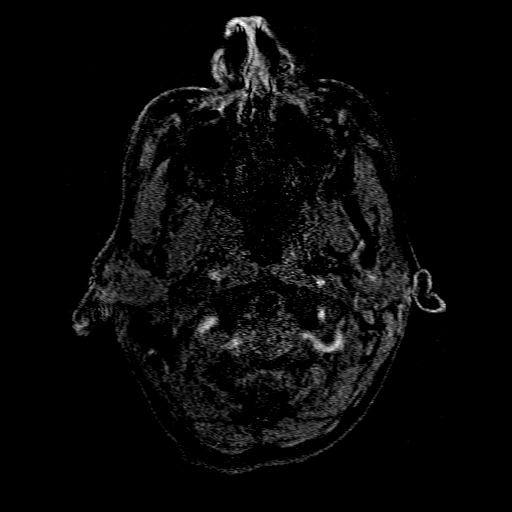
[im 4/176]
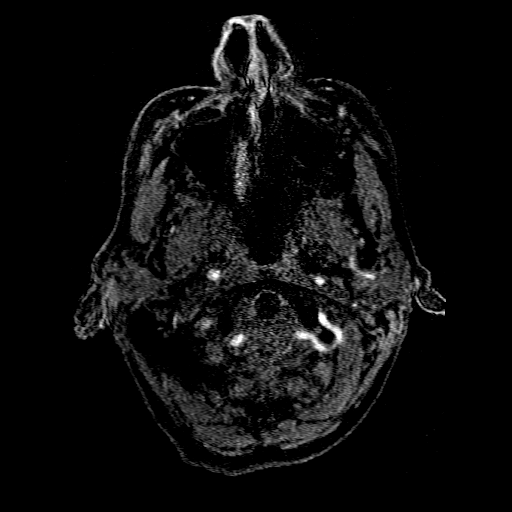
[im 8/176]
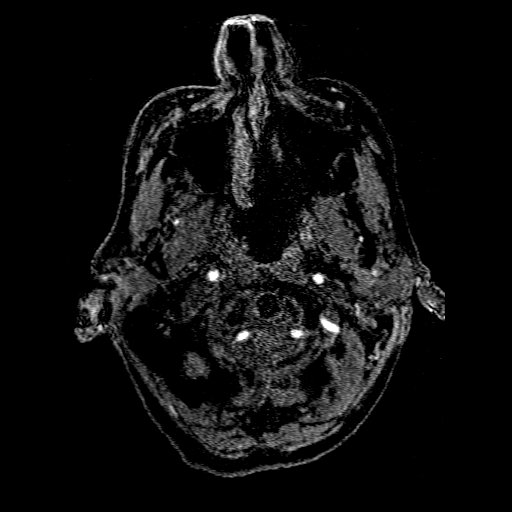
[im 12/176]
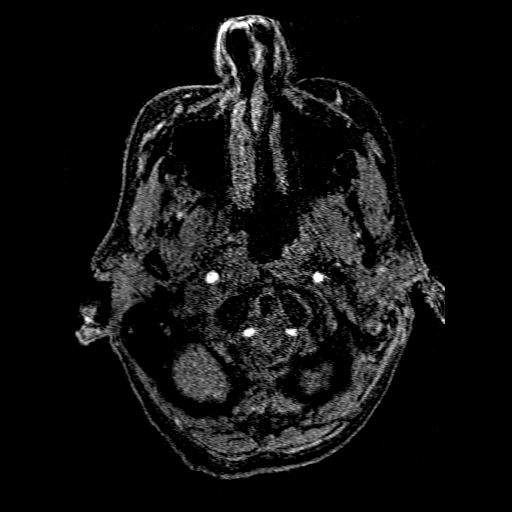
[im 15/176]
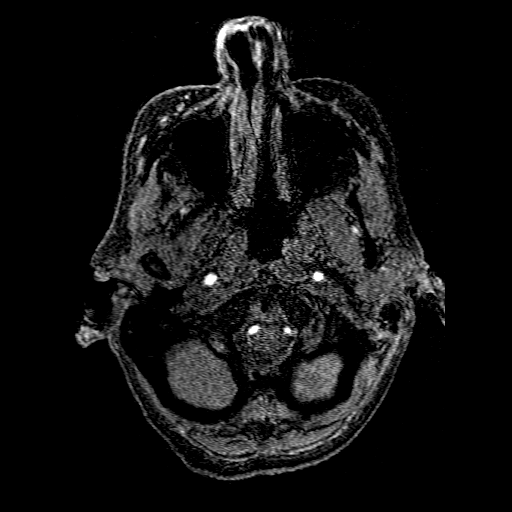
[im 19/176]
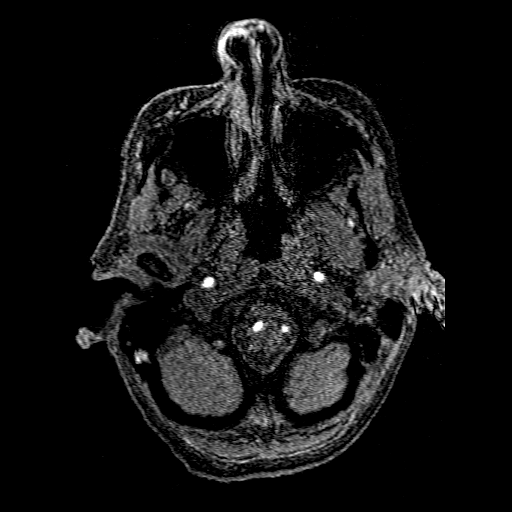
[im 23/176]
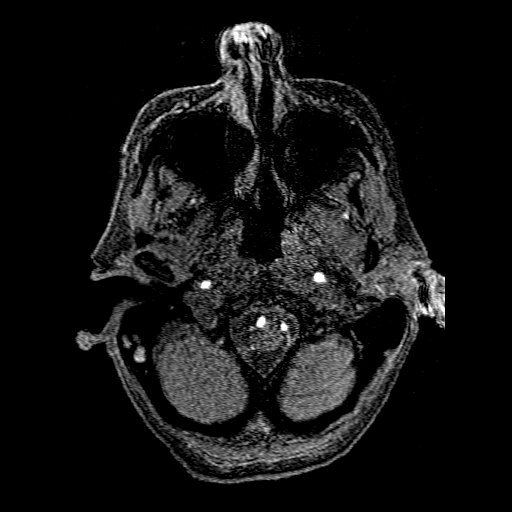
[im 27/176]
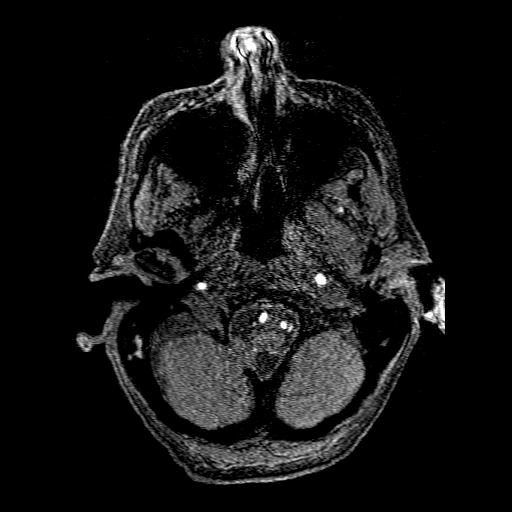
[im 30/176]
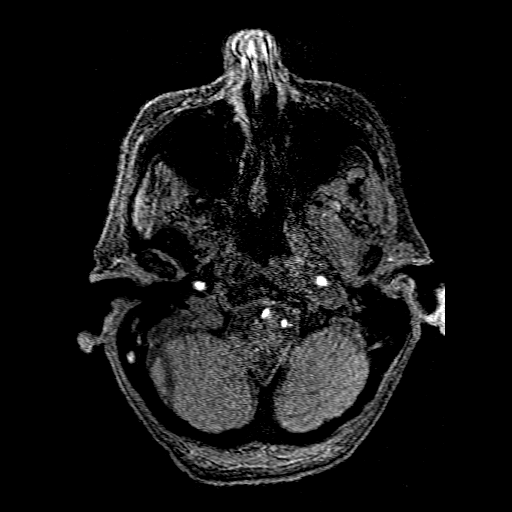
[im 34/176]
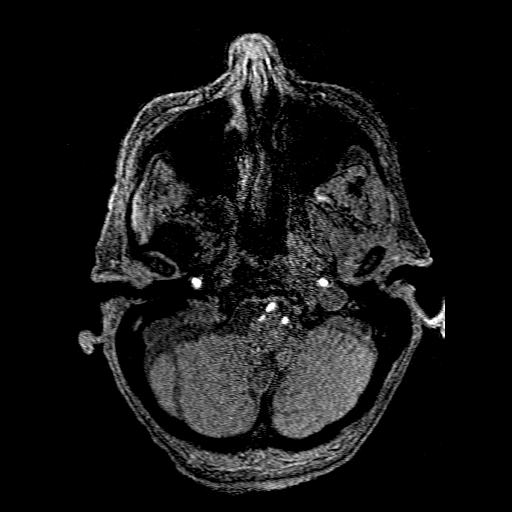
[im 38/176]
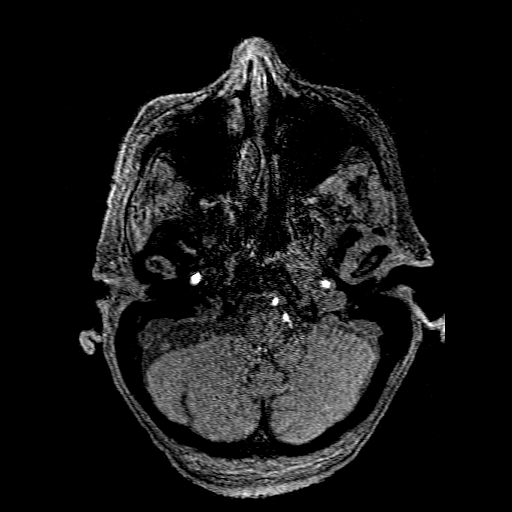
[im 41/176]
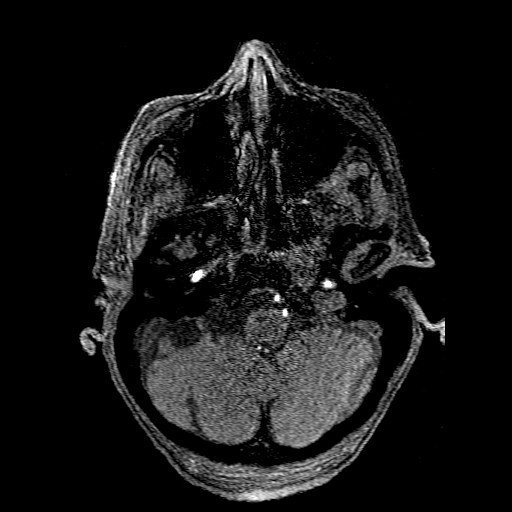
[im 56/176]
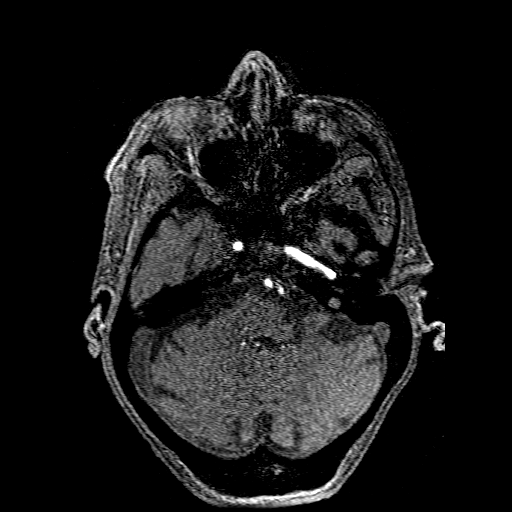
[im 79/176]
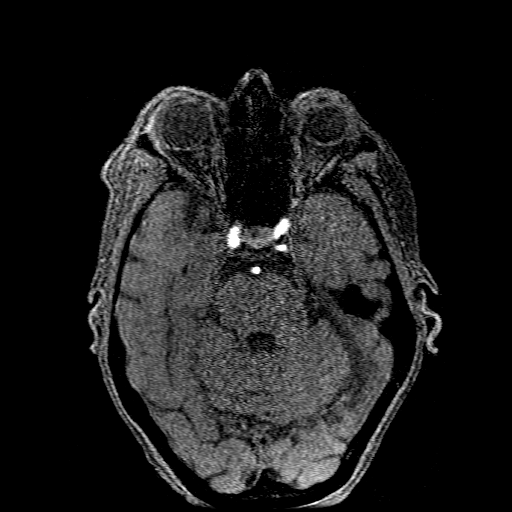
[im 90/176]
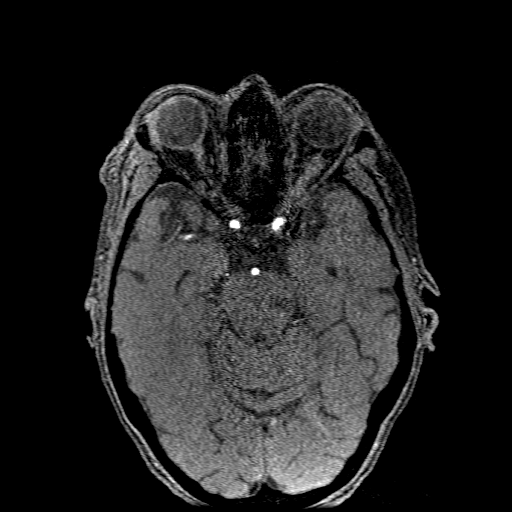
[im 101/176]
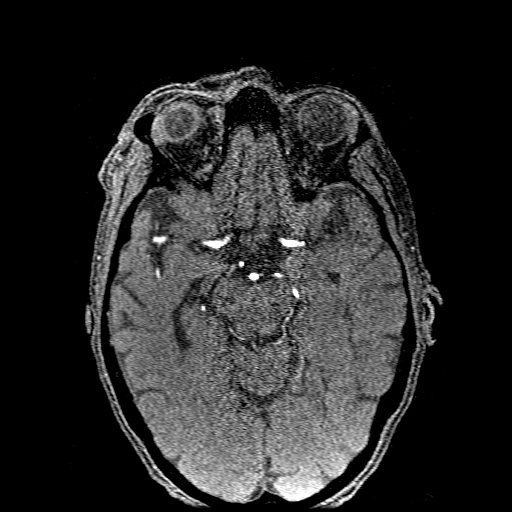
[im 123/176]
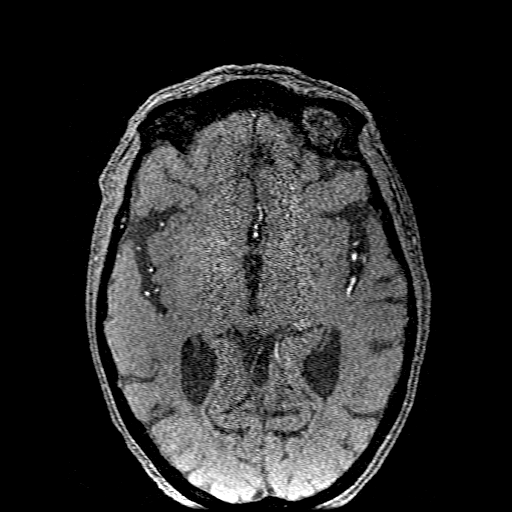
[im 146/176]
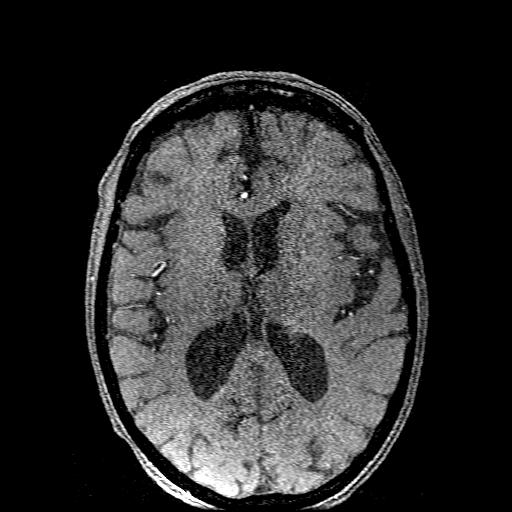
[im 149/176]
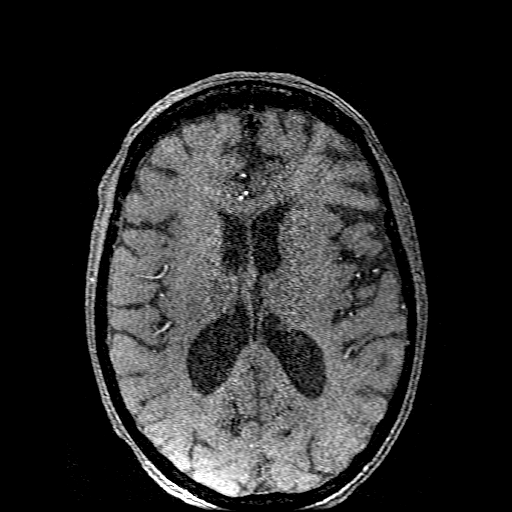
[im 168/176]
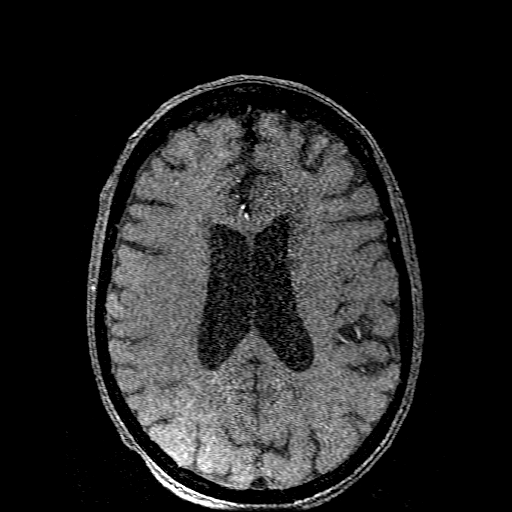

[20 of 48 positions shown; findings below may reference images not displayed]

FINDINGS: Intracranial internal carotid arteries are patent. Middle and
anterior cerebral arteries are patent. Intracranial vertebral
arteries, basilar artery, posterior cerebral arteries are patent.
Right posterior communicating artery is present. There is no
significant stenosis or aneurysm.
IMPRESSION: No proximal intracranial vessel occlusion or significant stenosis.

## 2020-09-12 IMAGING — MR MR HEAD W/O CM
6 of 11 series · 24 of 48 positions shown · non-contrast
Comparison: None.

CLINICAL DATA: Acute neurologic deficit

EXAM:
MRI HEAD WITHOUT CONTRAST
TECHNIQUE: Multiplanar, multiecho pulse sequences of the brain and surrounding
structures were obtained without intravenous contrast.

[Series 3: DWI · axial · 3.0mm · 0.94mm/px · z∈[-107,+41]mm · 6 of 98 slices shown (1 of 2)]
[im 1/98]
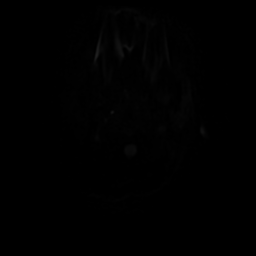
[im 20/98]
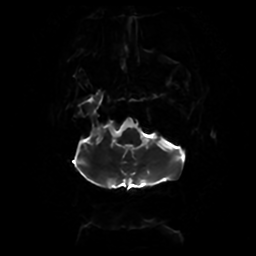
[im 39/98]
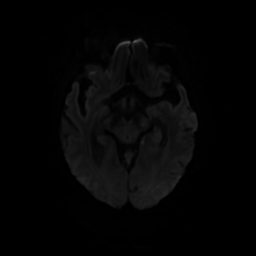
[im 59/98]
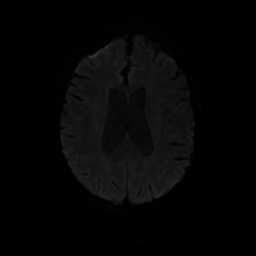
[im 78/98]
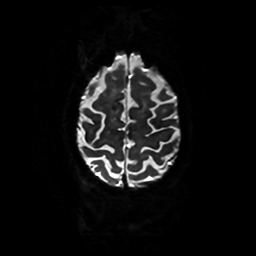
[im 98/98]
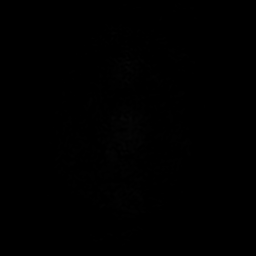

[Series 4: DWI · coronal · 4.0mm · 0.94mm/px · 6 of 74 slices shown (2 of 2)]
[im 1/74]
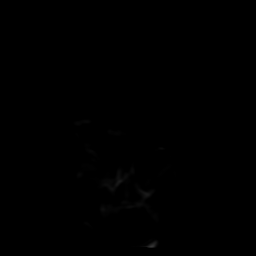
[im 15/74]
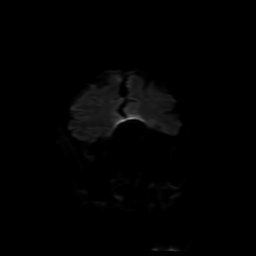
[im 30/74]
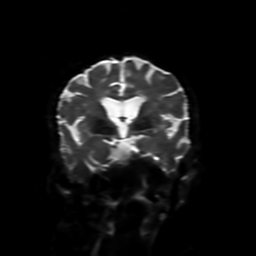
[im 44/74]
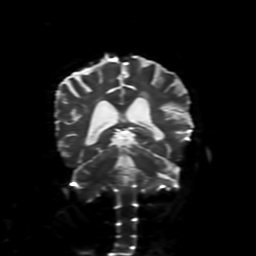
[im 59/74]
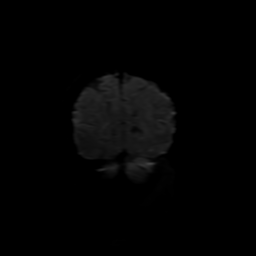
[im 74/74]
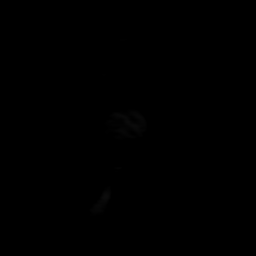

[Series 5: FLAIR · sagittal · 5.0mm · 0.23mm/px · 2 of 25 slices shown (1 of 2)]
[im 1/25]
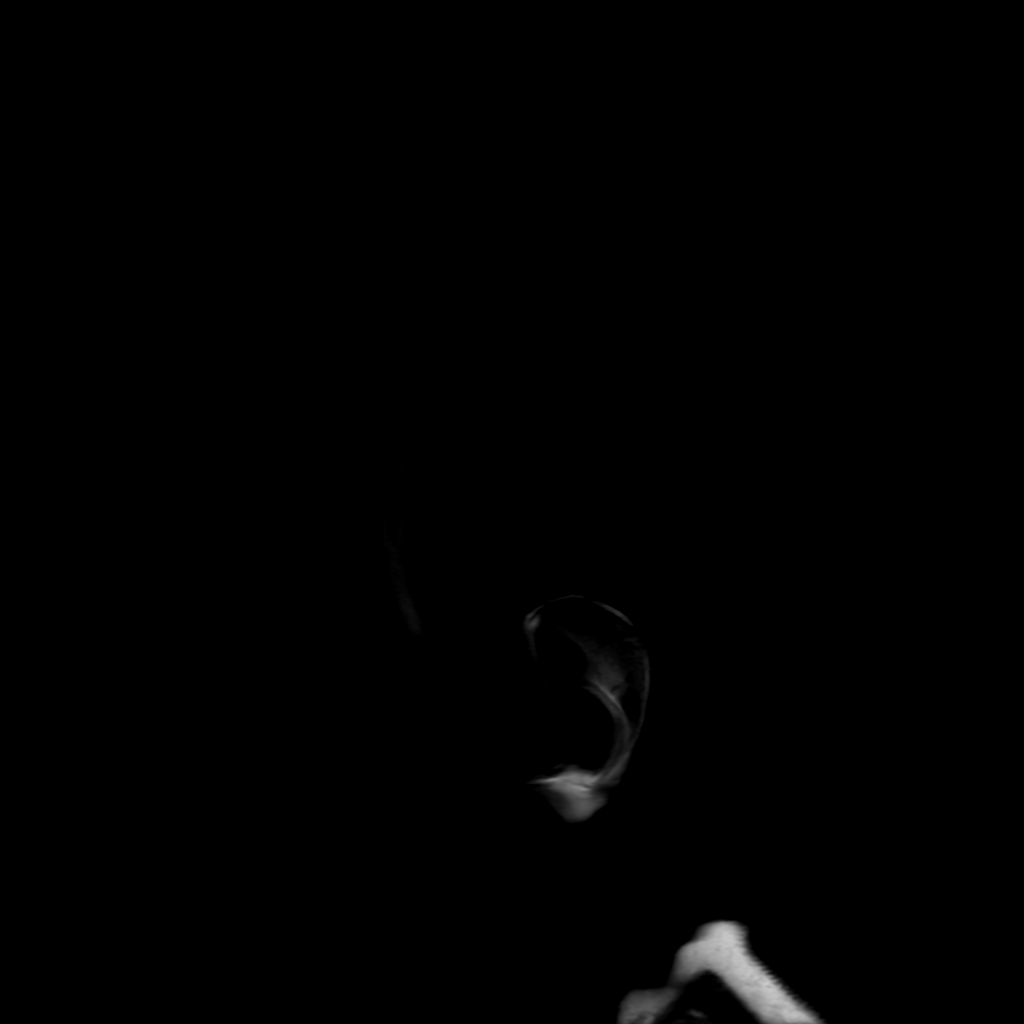
[im 25/25]
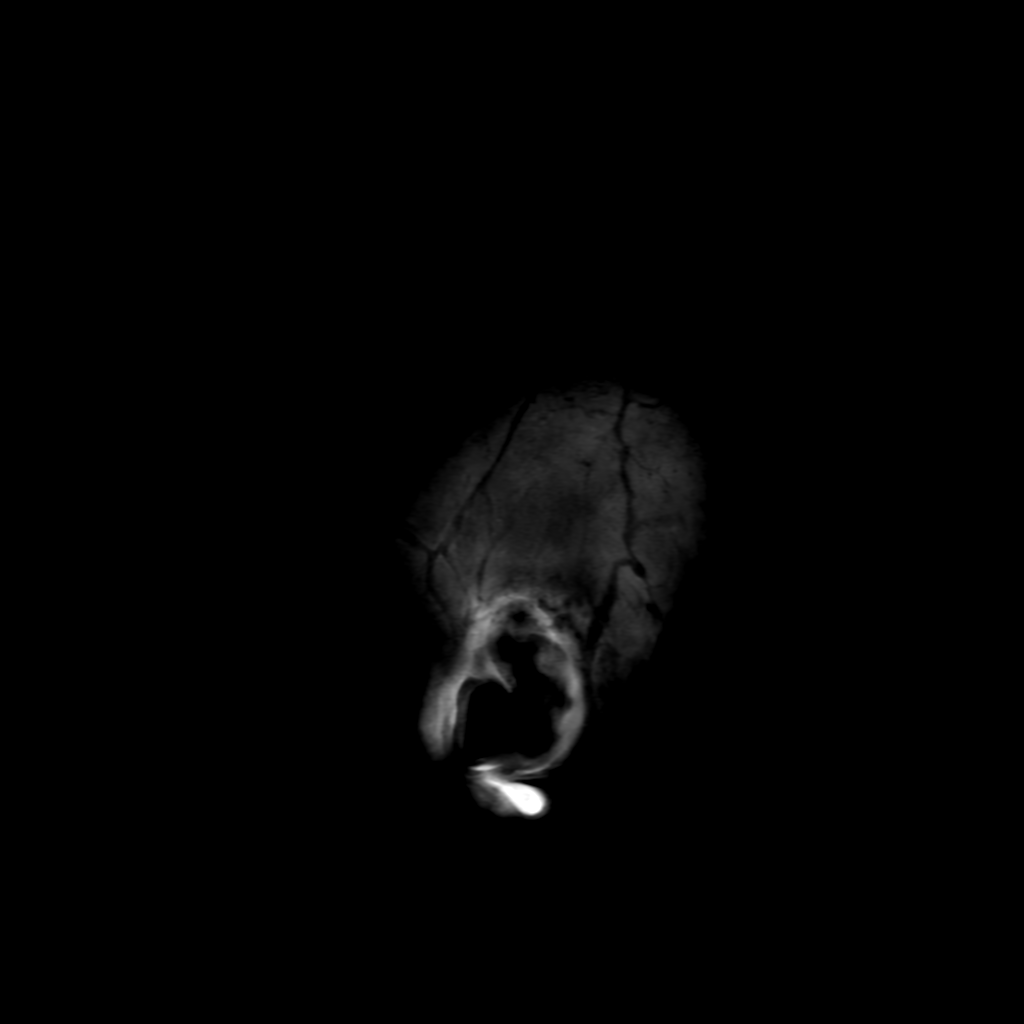

[Series 7: FLAIR · axial · 4.0mm · 0.45mm/px · z∈[-102,+45]mm · 3 of 35 slices shown (2 of 2)]
[im 1/35]
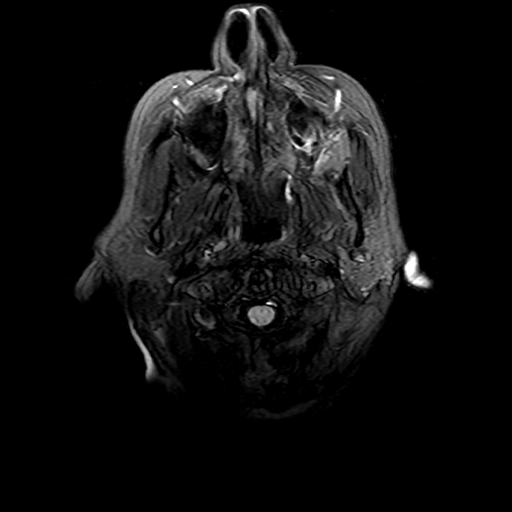
[im 18/35]
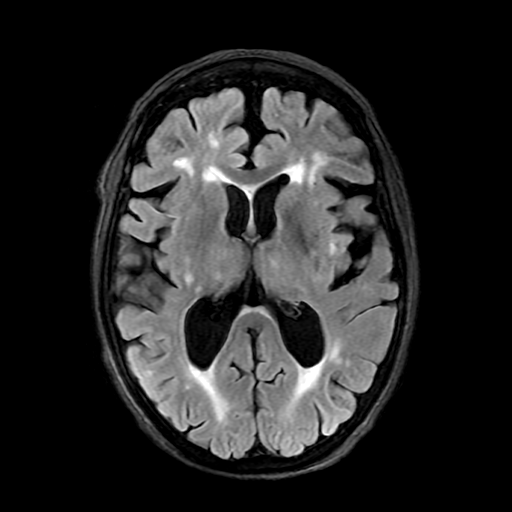
[im 35/35]
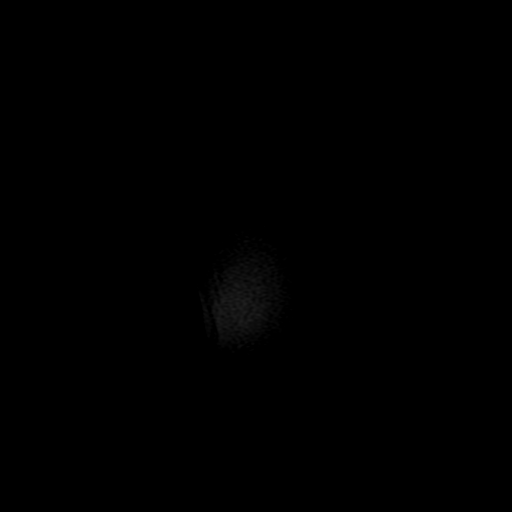

[Series 350: ADC · axial · 3.0mm · 0.94mm/px · z∈[-107,+41]mm · 4 of 51 slices shown (1 of 2)]
[im 1/51]
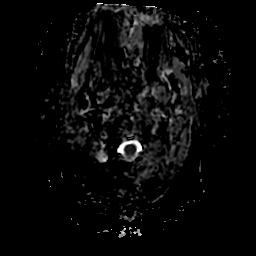
[im 17/51]
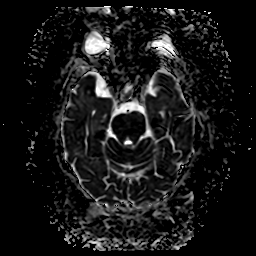
[im 34/51]
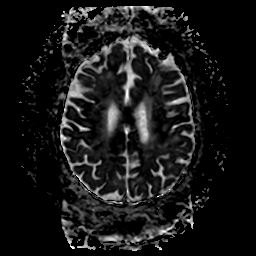
[im 51/51]
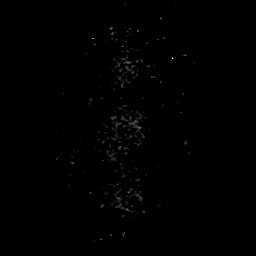

[Series 450: ADC · coronal · 4.0mm · 0.94mm/px · 3 of 37 slices shown (2 of 2)]
[im 1/37]
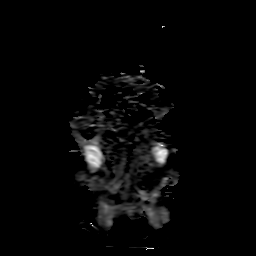
[im 19/37]
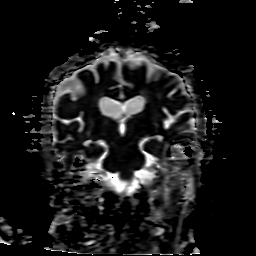
[im 37/37]
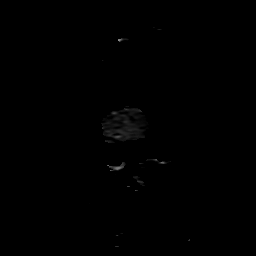

[24 of 48 positions shown; findings below may reference images not displayed]

FINDINGS: Brain: Punctate foci of abnormal diffusion restriction in the left
frontal lobe and right parietal lobe. No acute or chronic
hemorrhage. Hyperintense T2-weighted signal is moderately widespread
throughout the white matter. Generalized volume loss without a clear
lobar predilection. The midline structures are normal.

Vascular: Major flow voids are preserved.

Skull and upper cervical spine: Normal calvarium and skull base.
Visualized upper cervical spine and soft tissues are normal.

Sinuses/Orbits:No paranasal sinus fluid levels or advanced mucosal
thickening. No mastoid or middle ear effusion. Normal orbits.
IMPRESSION: 1. Punctate foci of acute ischemia in the left frontal lobe and
right parietal lobe. No hemorrhage or mass effect.
2. Moderate chronic small vessel ischemic disease and generalized
volume loss.

## 2020-09-12 MED ORDER — MAGNESIUM SULFATE 2 GM/50ML IV SOLN: 2.0000 g | Freq: Once | INTRAVENOUS | Status: DC

## 2020-09-12 MED ORDER — MAGNESIUM SULFATE 2 GM/50ML IV SOLN
2.0000 g | Freq: Once | INTRAVENOUS | Status: AC
  Administered 2020-09-12: 2 g via INTRAVENOUS
  Filled 2020-09-12: qty 50

## 2020-09-12 MED ORDER — FLECAINIDE ACETATE 50 MG PO TABS
75.0000 mg | ORAL_TABLET | Freq: Two times a day (BID) | ORAL | Status: DC
  Administered 2020-09-13: 75 mg via ORAL
  Filled 2020-09-12: qty 2

## 2020-09-12 MED ORDER — ACETAMINOPHEN 160 MG/5ML PO SOLN
500.0000 mg | Freq: Four times a day (QID) | ORAL | Status: DC | PRN
  Filled 2020-09-12: qty 20.3

## 2020-09-12 MED ORDER — METOPROLOL SUCCINATE ER 50 MG PO TB24
50.0000 mg | ORAL_TABLET | Freq: Every day | ORAL | Status: DC
  Administered 2020-09-13: 50 mg via ORAL
  Filled 2020-09-12 (×3): qty 1

## 2020-09-12 MED ORDER — BALSALAZIDE DISODIUM 750 MG PO CAPS
1500.0000 mg | ORAL_CAPSULE | Freq: Two times a day (BID) | ORAL | Status: DC
  Filled 2020-09-12 (×8): qty 2

## 2020-09-12 MED ORDER — ASPIRIN 300 MG RE SUPP
300.0000 mg | Freq: Once | RECTAL | Status: AC
  Administered 2020-09-12: 300 mg via RECTAL
  Filled 2020-09-12: qty 1

## 2020-09-12 MED ORDER — HEPARIN (PORCINE) 25000 UT/250ML-% IV SOLN
950.0000 [IU]/h | INTRAVENOUS | Status: DC
  Administered 2020-09-12: 950 [IU]/h via INTRAVENOUS
  Filled 2020-09-12: qty 250

## 2020-09-12 MED ORDER — ENSURE ENLIVE PO LIQD: 237.0000 mL | Freq: Two times a day (BID) | ORAL | Status: DC

## 2020-09-12 MED ORDER — FLECAINIDE ACETATE 50 MG PO TABS: 100.0000 mg | ORAL_TABLET | Freq: Two times a day (BID) | ORAL | Status: DC

## 2020-09-12 NOTE — Progress Notes (Signed)
ANTICOAGULATION CONSULT NOTE - Initial Consult  Pharmacy Consult for heparin Indication: CP, afib    Allergies  Allergen Reactions   Penicillin G Nausea Only    Has patient had a PCN reaction causing immediate rash, facial/tongue/throat swelling, SOB or lightheadedness with hypotension:No Has patient had a PCN reaction causing severe rash involving mucus membranes or skin necrosis: No Has patient had a PCN reaction that required hospitalization: No Has patient had a PCN reaction occurring within the last 10 years: No--NAUSEA ONLY If all of the above answers are "NO", then may proceed with Cephalosporin use.     Patient Measurements: Weight: 67.5 kg (148 lb 13 oz) Heparin Dosing Weight: 67.5 kg  Vital Signs: Temp: 97.8 F (36.6 C) (08/20 0416) Temp Source: Oral (08/20 0416) BP: 134/76 (08/20 0416) Pulse Rate: 51 (08/20 0416)  Labs: Recent Labs    09/11/20 1925 09/11/20 2125 09/12/20 0307  HGB 9.6*  --  9.0*  HCT 31.6*  --  29.5*  PLT 223  --  206  APTT  --   --  41*  LABPROT  --   --  28.6*  INR  --   --  2.7*  CREATININE 1.36*  --  1.32*  TROPONINIHS 147* 134*  --     Estimated Creatinine Clearance: 33.7 mL/min (A) (by C-G formula based on SCr of 1.32 mg/dL (H)).   Medical History: Past Medical History:  Diagnosis Date   Anemia    Anxiety    Anxiety disorder    Anxiety disorder, unspecified    Arthritis    Atrial fibrillation (HCC)    Chronic fatigue    Chronic fatigue, unspecified    Cognitive communication deficit    Crohn's disease ([place])    Crohn's disease (regional enteritis) ([place])    Dementia ([place])    Depression    Essential hypertension    Gastroesophageal reflux disease without esophagitis    GERD (gastroesophageal reflux disease)    Heart murmur    Hypertension    Insomnia    Insomnia, unspecified    Major depression    Major depressive disorder    Muscle weakness (generalized)    Polyosteoarthritis    Reflux    Repeated falls     Vitamin B12 deficiency (dietary) anemia    Vitamin B12 deficiency anemia, unspecified    Vitamin D deficiency    Vitamin D deficiency, unspecified     Medications:  See medication history  Assessment: 75 yo lady on eliquis PTA for afib to start heparin for ACS. Hg 9.0, PTLC 206 Will need to monitor aPTT until aPTT and heparin level correlate. Goal of Therapy:  aPTT 66-102 sec Heparin level 0.3-0.7 units/ml Monitor platelets by anticoagulation protocol: Yes   Plan:  Start heparin drip now with no bolus at 950 units/hr Check aPTT in 6-8 hours Daily aPTT, heparin level and CBC Monitor for bleeding complications  Bengie Kaucher [place] 09/12/2020,5:37 AM

## 2020-09-12 NOTE — Progress Notes (Signed)
Progress Note  Patient Name: Kristen Gray Date of Encounter: 09/12/2020  [facility] Cardiologist: Aundra Dubin / Allred   Subjective   75 yo with dementia, residing in a memory care unit , Crohns disease, HTN, HLD  We were asked to see for elevated troponin levels   Pt is unable to give any hx  Has had altered mental status for 3 days   Inpatient Medications    Scheduled Meds:  balsalazide  1,500 mg Oral BID   feeding supplement  237 mL Oral BID BM   metoprolol succinate  50 mg Oral Daily   Continuous Infusions:  heparin 950 Units/hr (09/12/20 0800)   magnesium sulfate bolus IVPB     PRN Meds:    Vital Signs    Vitals:   09/12/20 0352 09/12/20 0416 09/12/20 0700 09/12/20 0739  BP: (!) 127/46 134/76  (!) 165/58  Pulse: 84 (!) 51  (!) 51  Resp: 18 18 15 16   Temp: 97.8 F (36.6 C) 97.8 F (36.6 C)  (!) 97.4 F (36.3 C)  TempSrc: Axillary Oral  Oral  SpO2: 98% 98%  99%  Weight:  67.5 kg      Intake/Output Summary (Last 24 hours) at 09/12/2020 1025 Last data filed at 09/12/2020 0800 Gross per 24 hour  Intake 7.58 ml  Output --  Net 7.58 ml   Last 3 Weights 09/12/2020 08/26/2020 08/01/2020  Weight (lbs) 148 lb 13 oz 140 lb 160 lb 15 oz  Weight (kg) 67.5 kg 63.504 kg 73 kg      Telemetry    Sinus brady  - Personally Reviewed  ECG     - Personally Reviewed  Physical Exam   GEN: elderly , frail, pleasantly demented female  Neck: No JVD Cardiac: RRR, n 5-0/2 systolic murmur   Respiratory: Clear to auscultation bilaterally. GI: Soft, nontender, non-distended  MS: No edema; No deformity. Neuro:  Nonfocal  Psych: Normal affect   Labs    High Sensitivity Troponin:   Recent Labs  Lab 09/11/20 1925 09/11/20 2125  TROPONINIHS 147* 134*      Chemistry Recent Labs  Lab 09/11/20 1925 09/12/20 0307  NA 131* 133*  K 3.9 3.7  CL 97* 99  CO2 22 24  GLUCOSE 141* 114*  BUN 27* 27*  CREATININE 1.36* 1.32*  CALCIUM 8.5* 8.5*  PROT 5.9* 5.6*  ALBUMIN  2.6* 2.5*  AST 93* 73*  ALT 13 14  ALKPHOS 105 104  BILITOT 1.0 1.3*  GFRNONAA 40* 42*  ANIONGAP 12 10     Hematology Recent Labs  Lab 09/11/20 1925 09/12/20 0307  WBC 14.4* 11.1*  RBC 3.78* 3.56*  HGB 9.6* 9.0*  HCT 31.6* 29.5*  MCV 83.6 82.9  MCH 25.4* 25.3*  MCHC 30.4 30.5  RDW 17.3* 17.5*  PLT 223 206    BNP Recent Labs  Lab 09/11/20 0307  BNP 1,836.5*     DDimer No results for input(s): DDIMER in the last 168 hours.   Radiology    CT HEAD WO CONTRAST (5MM)  Result Date: 09/11/2020 CLINICAL DATA:  Altered mental status. EXAM: CT HEAD WITHOUT CONTRAST TECHNIQUE: Contiguous axial images were obtained from the base of the skull through the vertex without intravenous contrast. COMPARISON:  Head CT dated 08/30/2020. FINDINGS: Brain: There is mild age-related atrophy and chronic microvascular ischemic changes. There is no acute intracranial hemorrhage. No mass effect or midline shift. No extra-axial fluid collection. Vascular: No hyperdense vessel or unexpected calcification. Skull: Normal. Negative  for fracture or focal lesion. Sinuses/Orbits: No acute finding. Other: None IMPRESSION: 1. No acute intracranial pathology. 2. Mild age-related atrophy and chronic microvascular ischemic changes. Electronically Signed   By: Anner Crete M.D.   On: 09/11/2020 20:35   MR BRAIN WO CONTRAST  Result Date: 09/12/2020 CLINICAL DATA:  Acute neurologic deficit EXAM: MRI HEAD WITHOUT CONTRAST TECHNIQUE: Multiplanar, multiecho pulse sequences of the brain and surrounding structures were obtained without intravenous contrast. COMPARISON:  None. FINDINGS: Brain: Punctate foci of abnormal diffusion restriction in the left frontal lobe and right parietal lobe. No acute or chronic hemorrhage. Hyperintense T2-weighted signal is moderately widespread throughout the white matter. Generalized volume loss without a clear lobar predilection. The midline structures are normal. Vascular: Major flow  voids are preserved. Skull and upper cervical spine: Normal calvarium and skull base. Visualized upper cervical spine and soft tissues are normal. Sinuses/Orbits:No paranasal sinus fluid levels or advanced mucosal thickening. No mastoid or middle ear effusion. Normal orbits. IMPRESSION: 1. Punctate foci of acute ischemia in the left frontal lobe and right parietal lobe. No hemorrhage or mass effect. 2. Moderate chronic small vessel ischemic disease and generalized volume loss. Electronically Signed   By: Ulyses Jarred M.D.   On: 09/12/2020 02:04   DG Chest Portable 1 View  Result Date: 09/11/2020 CLINICAL DATA:  Chest pain EXAM: PORTABLE CHEST 1 VIEW COMPARISON:  08/30/2020 FINDINGS: Cardiomegaly.  No focal opacity, pleural effusion or pneumothorax. IMPRESSION: No active disease.  Cardiomegaly. Electronically Signed   By: Donavan Foil M.D.   On: 09/11/2020 21:01    Cardiac Studies     Patient Profile     75 y.o. female with severe dementia,    Assessment & Plan    Elevated troponins:  likely related to her CHF .  Her troponins have been chronically elevated.   2.  Chronic combined CHF:   EF 47%.  Grade 2 DD.  Mod - severe pulmonary HTN Mod - severe MR and mod- severe TR , mild - mod AI BP has been elevated.  Some Lasix might be considered if she starts having some dyspnea . She may not be able to tell us if she is short of breath   I have discussed with Dr. Maryland Pink.  The family has decided not to pursue any further measures.  She has been seen by palliative care The evenutal plan is for hospice / comfort care.     [facility] will sign off.   Medication Recommendations:  cont current meds.  Other recommendations (labs, testing, etc):   Follow up as an outpatient:  with hospice    For questions or updates, please contact [place] Please consult [email or web address] for contact info under        Signed, Mertie Moores, MD  09/12/2020, 10:25 AM

## 2020-09-12 NOTE — Progress Notes (Signed)
Carotid duplex has been completed.  Results can be found under chart review under CV PROC. 09/12/2020 10:32 AM Trimaine Maser RVT, RDMS

## 2020-09-12 NOTE — ED Notes (Signed)
Patient is still in MRI

## 2020-09-12 NOTE — ED Notes (Signed)
Admitting provider at bedside.

## 2020-09-12 NOTE — Evaluation (Addendum)
Clinical/Bedside Swallow Evaluation Patient Details  Name: Kristen Gray MRN: [id number] Date of Birth: [birth year]  Today's Date: 09/12/2020 Time: SLP Start Time (ACUTE ONLY): 1330 SLP Stop Time (ACUTE ONLY): 1350 SLP Time Calculation (min) (ACUTE ONLY): 20 min  Past Medical History:  Past Medical History:  Diagnosis Date   Anemia    Anxiety    Anxiety disorder    Anxiety disorder, unspecified    Arthritis    Atrial fibrillation (HCC)    Chronic fatigue    Chronic fatigue, unspecified    Cognitive communication deficit    Crohn's disease ([place])    Crohn's disease (regional enteritis) ([place])    Dementia ([place])    Depression    Essential hypertension    Gastroesophageal reflux disease without esophagitis    GERD (gastroesophageal reflux disease)    Heart murmur    Hypertension    Insomnia    Insomnia, unspecified    Major depression    Major depressive disorder    Muscle weakness (generalized)    Polyosteoarthritis    Reflux    Repeated falls    Vitamin B12 deficiency (dietary) anemia    Vitamin B12 deficiency anemia, unspecified    Vitamin D deficiency    Vitamin D deficiency, unspecified    Past Surgical History:  Past Surgical History:  Procedure Laterality Date   ANKLE SURGERY     CARDIOVERSION N/A 03/09/2020   Procedure: CARDIOVERSION;  Surgeon: Larey Dresser, MD;  Location[facility] ENDOSCOPY;  Service: Cardiovascular;  Laterality: N/A;   CARDIOVERSION N/A 03/05/2020   Procedure: CARDIOVERSION;  Surgeon: Larey Dresser, MD;  Location: [place];  Service: Cardiovascular;  Laterality: N/A;   CARPAL TUNNEL RELEASE     CHOLECYSTECTOMY     COLON SURGERY     RIGHT/LEFT HEART CATH AND CORONARY ANGIOGRAPHY N/A 09/15/2016   Procedure: RIGHT/LEFT HEART CATH AND CORONARY ANGIOGRAPHY;  Surgeon: Larey Dresser, MD;  Location: [facility] CV LAB;  Service: Cardiovascular;  Laterality: N/A;   TEE WITHOUT CARDIOVERSION N/A 03/05/2020   Procedure: TRANSESOPHAGEAL  ECHOCARDIOGRAM (TEE);  Surgeon: Larey Dresser, MD;  Location: [facility] ENDOSCOPY;  Service: Cardiovascular;  Laterality: N/A;   HPI:  75 y.o. female with medical history significant for dementia, hypertension, hyperlipidemia Crohn's and AFib on eliquis who presented to the ED via EMS from [facility] due to altered mental status.  Patient was unable to provide a history at bedside, she states that she does not know why she was brought to the ED.  History was obtained from ED physician and ED medical record.  Per report, patient was reported to have been confused for about 3 days and that he seems to be leaning more towards the right side.  She was reported to have some confusion at baseline, but she was presumed to have worsened confusion at this time.  There was no report of fever, chills, chest pain, shortness of breath, abdominal pain, nausea or vomiting.  Patient was recently admitted from 7/8-7/13 due to acute metabolic encephalopathy and UTI which was treated with Levaquin x1 and ceftriaxone x2 from 7/9-7/11; CXR on 09/11/20 negative; MRI brain 09/12/20 indicated Punctate foci of acute ischemia in the left frontal lobe and  right parietal lobe. No hemorrhage or mass effect.  2. Moderate chronic small vessel ischemic disease and generalized  volume loss; Pt made NPO and BSE generated.   Assessment / Plan / Recommendation Clinical Impression  Pt seen for clinical swallowing evaluation with cognitive-based dysphagia noted  including oral holding, oral expulsion/refusal and delayed cough with larger intake of thin liquids.  Smaller volume of liquids remedied this occurrence.  Pt with slow, impaired mastication, but pt would consume solid textures and swallow effectively.  Puree consistencies, she would expel from oral cavity.  OME limited, but no anterior loss or pocketing noted during examination.  Recommend initiating a Dysphagia 2/thin liquid diet with general swallowing precautions placed in room  for assistance with self-feeding to reduce mild aspiration risk.  ST will f/u briefly for diet tolerance in acute setting.  Thank you for this consultation. SLP Visit Diagnosis: Dysphagia, unspecified (R13.10)    Aspiration Risk  Mild aspiration risk    Diet Recommendation   Dysphagia 2/thin via small sips only w/ FULL precautions  Medication Administration: Crushed with puree    Other  Recommendations Oral Care Recommendations: Oral care BID   Follow up Recommendations Other (comment) ([facility])      Frequency and Duration min 1 x/week  1 week       Prognosis Prognosis for Safe Diet Advancement: Good Barriers to Reach Goals: Cognitive deficits      Swallow Study   General Date of Onset: 09/11/20 HPI: 75 y.o. female with medical history significant for dementia, hypertension, hyperlipidemia Crohn's and AFib on eliquis who presented to the ED via EMS from [facility] due to altered mental status.  Patient was unable to provide a history at bedside, she states that she does not know why she was brought to the ED.  History was obtained from ED physician and ED medical record.  Per report, patient was reported to have been confused for about 3 days and that he seems to be leaning more towards the right side.  She was reported to have some confusion at baseline, but she was presumed to have worsened confusion at this time.  There was no report of fever, chills, chest pain, shortness of breath, abdominal pain, nausea or vomiting.  Patient was recently admitted from 7/8-7/13 due to acute metabolic encephalopathy and UTI which was treated with Levaquin x1 and ceftriaxone x2 from 7/9-7/11; CXR on 09/11/20 negative; MRI brain 09/12/20 indicated Punctate foci of acute ischemia in the left frontal lobe and  right parietal lobe. No hemorrhage or mass effect.  2. Moderate chronic small vessel ischemic disease and generalized  volume loss; Pt made NPO and BSE generated. Type of  Study: Bedside Swallow Evaluation Previous Swallow Assessment: failed [facility] 09/12/20 d/t lethargy Diet Prior to this Study: NPO Temperature Spikes Noted: No Respiratory Status: Room air History of Recent Intubation: No Behavior/Cognition: Alert;Confused;Requires cueing Oral Cavity Assessment: Other (comment) (oral stasis noted on tongue; pt refused oral care fully, but removed prior to PO intake given) Oral Care Completed by SLP: Yes (partially) Oral Cavity - Dentition: Adequate natural dentition Self-Feeding Abilities: Needs assist Patient Positioning: Upright in bed Baseline Vocal Quality: Low vocal intensity Volitional Cough: Cognitively unable to elicit Volitional Swallow: Unable to elicit    Oral/Motor/Sensory Function Overall Oral Motor/Sensory Function: Other (comment) (DTA d/t pt cognition)   Ice Chips Ice chips: Impaired Presentation: Spoon Oral Phase Impairments: Other (comment) (expelled from oral cavity; min oral manipulation noted)   Thin Liquid Thin Liquid: Impaired Presentation: Straw;Spoon Oral Phase Functional Implications: Oral holding Pharyngeal  Phase Impairments: Cough - Immediate;Suspected delayed Swallow Other Comments: coughing noted with multiple, successive swallows of thin only; okay with small sips    Nectar Thick Nectar Thick Liquid: Not tested   Honey Thick  Honey Thick Liquid: Not tested   Puree Puree: Impaired Presentation: Spoon Oral Phase Impairments: Other (comment) (expelled from oral cavity)   Solid     Solid: Impaired Presentation: Spoon Oral Phase Impairments: Impaired mastication      Elvina Sidle, M.S., CCC-SLP 09/12/2020,2:32 PM

## 2020-09-12 NOTE — ED Notes (Signed)
Urine culture sent down with u/a

## 2020-09-12 NOTE — Progress Notes (Signed)
[facility] PROGRESS NOTE   Kristen Gray FXO:[id number] DOB: 1945/05/29 DOA: 09/11/2020  PCP: Garwin Brothers, MD  Brief History/Interval Summary:  75 y.o. female with medical history significant for dementia, hypertension, hyperlipidemia Crohn's and AFib on eliquis who presented to the ED via EMS from [place] memory care due to altered mental status.  Due to her dementia patient was unable to provide any information.  Patient was recently hospitalized in July due to acute metabolic encephalopathy from a urinary tract infection and was treated with IV antibiotics.  Evaluation in the ED raised concern for new acute stroke along with elevated troponin and BNP raising concern for acute coronary syndrome.     Consultants: Cardiology.  Neurology.  We will also consult palliative care  Procedures: None yet  Antibiotics: Anti-infectives (From admission, onward)    None       Subjective/Interval History: Patient noted to be pleasantly confused.  Does not appear to be in any discomfort or distress.  Unable to obtain any information due to her dementia.     Assessment/Plan:  Non-ST elevation MI Troponins were noted to be elevated.  EKG showed significant T wave changes in several leads.  Cardiology was consulted.  Patient placed on heparin infusion.  Echocardiogram has been ordered.  Noted to have significantly elevated BNP.  No clinical signs of volume overload is noted at this time.  Acute stroke Patient with history of atrial fibrillation on anticoagulation.  Noted to have 2 acute infarcts.  Neurology has been consulted.  MRA head carotid Doppler has been recommended with echocardiogram.  Permissive hypertension.  PT OT SLP.  TSH is 2.9.  Check lipid panel and HbA1c.  Acute metabolic encephalopathy in the setting of dementia According to her sister patient's dementia has been progressively worsening over the past several months.  She is also been declining from a physical  activity standpoint.  Chronic kidney disease stage IIIb/hyponatremia/hypomagnesemia Monitor renal function closely.  Monitor urine output.  Replace magnesium.  Prolonged QTC Avoid QT prolonging medications.  Normocytic anemia No evidence for blood loss.  Continue to monitor.  Paroxysmal atrial fibrillation On Eliquis prior to admission.  Currently on hold.  Currently on IV heparin.  History of Crohn's disease Seems to be stable.  Leukocytosis Appears to be reactive.  Goals of care Long discussion with patient's sister who is the power of attorney as well as next of kin.  She reports decline in patient's cognitive function as well as physical functioning over the last 6 months.  May not be unreasonable to get palliative care and for and consider transition to hospice.  Sister is willing to talk to them.  We will also wait on specialty input.   DVT Prophylaxis: On IV heparin Code Status: Discussed with patient and sister.  Changed to DNR. Family Communication: Discussed with patient's sister who is her power of attorney and next of kin Disposition Plan: From a memory care unit.  To be determined.  Status is: Inpatient  Remains inpatient appropriate because:Altered mental status, Ongoing diagnostic testing needed not appropriate for outpatient work up, and Inpatient level of care appropriate due to severity of illness  Dispo: The patient is from:  Memory care unit              Anticipated d/c is to:  To be determined              Patient currently is not medically stable to d/c.   Difficult to place patient No  Medications: Scheduled:  balsalazide  1,500 mg Oral BID   feeding supplement  237 mL Oral BID BM   metoprolol succinate  50 mg Oral Daily   Continuous:  heparin 950 Units/hr (09/12/20 0800)   magnesium sulfate bolus IVPB 2 g (09/12/20 1104)   PRN:   Objective:  Vital Signs  Vitals:   09/12/20 1030 09/12/20 1045 09/12/20 1100 09/12/20 1108  BP:   (!) 146/53    Pulse: (!) 52 (!) 51 (!) 52   Resp: 16 16 16    Temp:    98.2 F (36.8 C)  TempSrc:    Oral  SpO2: 98% 100% 96%   Weight:        Intake/Output Summary (Last 24 hours) at 09/12/2020 1111 Last data filed at 09/12/2020 0800 Gross per 24 hour  Intake 7.58 ml  Output --  Net 7.58 ml   Filed Weights   09/12/20 0416  Weight: 67.5 kg    General appearance: Awake alert.  In no distress.  Distracted Resp: Clear to auscultation bilaterally.  Normal effort Cardio: S1-S2 is normal regular.  No S3-S4.  No rubs murmurs or bruit GI: Abdomen is soft.  Nontender nondistended.  Bowel sounds are present normal.  No masses organomegaly Extremities: No edema.  Moving all of her extremities Neurologic:  No focal neurological deficits.    Lab Results:  Data Reviewed: I have personally reviewed following labs and imaging studies  CBC: Recent Labs  Lab 09/11/20 1925 09/12/20 0307  WBC 14.4* 11.1*  NEUTROABS 11.9*  --   HGB 9.6* 9.0*  HCT 31.6* 29.5*  MCV 83.6 82.9  PLT 223 462    Basic Metabolic Panel: Recent Labs  Lab 09/11/20 1925 09/12/20 0307  NA 131* 133*  K 3.9 3.7  CL 97* 99  CO2 22 24  GLUCOSE 141* 114*  BUN 27* 27*  CREATININE 1.36* 1.32*  CALCIUM 8.5* 8.5*  MG  --  1.6*  PHOS  --  4.0    GFR: Estimated Creatinine Clearance: 33.7 mL/min (A) (by C-G formula based on SCr of 1.32 mg/dL (H)).  Liver Function Tests: Recent Labs  Lab 09/11/20 1925 09/12/20 0307  AST 93* 73*  ALT 13 14  ALKPHOS 105 104  BILITOT 1.0 1.3*  PROT 5.9* 5.6*  ALBUMIN 2.6* 2.5*      Coagulation Profile: Recent Labs  Lab 09/12/20 0307  INR 2.7*      Thyroid Function Tests: Recent Labs    09/12/20 0616  TSH 2.956      Recent Results (from the past 240 hour(s))  SARS CORONAVIRUS 2 (TAT 6-24 HRS) Nasopharyngeal Nasopharyngeal Swab     Status: None   Collection Time: 09/11/20 11:23 PM   Specimen: Nasopharyngeal Swab  Result Value Ref Range Status   SARS  Coronavirus 2 NEGATIVE NEGATIVE Final    Comment: (NOTE) SARS-CoV-2 target nucleic acids are NOT DETECTED.  The SARS-CoV-2 RNA is generally detectable in upper and lower respiratory specimens during the acute phase of infection. Negative results do not preclude SARS-CoV-2 infection, do not rule out co-infections with other pathogens, and should not be used as the sole basis for treatment or other patient management decisions. Negative results must be combined with clinical observations, patient history, and epidemiological information. The expected result is Negative.  Fact Sheet for Patients: SugarRoll.be  Fact Sheet for Healthcare Providers: https://www.woods-mathews.com/  This test is not yet approved or cleared by the [place] FDA and  has been authorized for detection and/or  diagnosis of SARS-CoV-2 by FDA under an Emergency Use Authorization (EUA). This EUA will remain  in effect (meaning this test can be used) for the duration of the COVID-19 declaration under Se ction 564(b)(1) of the Act, 21 U.S.C. section 360bbb-3(b)(1), unless the authorization is terminated or revoked sooner.  Performed at [facility], [place] [address], [place], [place] [address]       Radiology Studies: CT HEAD WO CONTRAST (5MM)  Result Date: 09/11/2020 CLINICAL DATA:  Altered mental status. EXAM: CT HEAD WITHOUT CONTRAST TECHNIQUE: Contiguous axial images were obtained from the base of the skull through the vertex without intravenous contrast. COMPARISON:  Head CT dated 08/30/2020. FINDINGS: Brain: There is mild age-related atrophy and chronic microvascular ischemic changes. There is no acute intracranial hemorrhage. No mass effect or midline shift. No extra-axial fluid collection. Vascular: No hyperdense vessel or unexpected calcification. Skull: Normal. Negative for fracture or focal lesion. Sinuses/Orbits: No acute finding. Other: None IMPRESSION:  1. No acute intracranial pathology. 2. Mild age-related atrophy and chronic microvascular ischemic changes. Electronically Signed   By: Anner Crete M.D.   On: 09/11/2020 20:35   MR BRAIN WO CONTRAST  Result Date: 09/12/2020 CLINICAL DATA:  Acute neurologic deficit EXAM: MRI HEAD WITHOUT CONTRAST TECHNIQUE: Multiplanar, multiecho pulse sequences of the brain and surrounding structures were obtained without intravenous contrast. COMPARISON:  None. FINDINGS: Brain: Punctate foci of abnormal diffusion restriction in the left frontal lobe and right parietal lobe. No acute or chronic hemorrhage. Hyperintense T2-weighted signal is moderately widespread throughout the white matter. Generalized volume loss without a clear lobar predilection. The midline structures are normal. Vascular: Major flow voids are preserved. Skull and upper cervical spine: Normal calvarium and skull base. Visualized upper cervical spine and soft tissues are normal. Sinuses/Orbits:No paranasal sinus fluid levels or advanced mucosal thickening. No mastoid or middle ear effusion. Normal orbits. IMPRESSION: 1. Punctate foci of acute ischemia in the left frontal lobe and right parietal lobe. No hemorrhage or mass effect. 2. Moderate chronic small vessel ischemic disease and generalized volume loss. Electronically Signed   By: Ulyses Jarred M.D.   On: 09/12/2020 02:04   DG Chest Portable 1 View  Result Date: 09/11/2020 CLINICAL DATA:  Chest pain EXAM: PORTABLE CHEST 1 VIEW COMPARISON:  08/30/2020 FINDINGS: Cardiomegaly.  No focal opacity, pleural effusion or pneumothorax. IMPRESSION: No active disease.  Cardiomegaly. Electronically Signed   By: Donavan Foil M.D.   On: 09/11/2020 21:01   VAS US CAROTID  Result Date: 09/12/2020 Carotid Arterial Duplex Study Patient Name:  Kristen Gray  Date of Exam:   09/12/2020 Medical Rec #: [id number]       Accession #:    [id number] Date of Birth: 10/08/1945       Patient Gender: F Patient Age:   62  years Exam Location:  [facility] Procedure:      VAS US CAROTID Referring Phys: Alferd Patee [facility] --------------------------------------------------------------------------------  Indications:       CVA. Risk Factors:      Hypertension, hyperlipidemia, no history of smoking, prior                    MI. Other Factors:     Afib (on Eliquis), CKD,. Limitations        Today's exam was limited due to patient unable to cooperate                    with proper positioning, tortuous vessels. Comparison  Study:  No previous exams Performing Technologist: Jody Hill RVT, RDMS  Examination Guidelines: A complete evaluation includes B-mode imaging, spectral Doppler, color Doppler, and power Doppler as needed of all accessible portions of each vessel. Bilateral testing is considered an integral part of a complete examination. Limited examinations for reoccurring indications may be performed as noted.  Right Carotid Findings: +----------+--------+--------+--------+------------------+------------------+           PSV cm/sEDV cm/sStenosisPlaque DescriptionComments           +----------+--------+--------+--------+------------------+------------------+ CCA Prox  50      4                                 intimal thickening +----------+--------+--------+--------+------------------+------------------+ CCA Distal45      5                                 intimal thickening +----------+--------+--------+--------+------------------+------------------+ ICA Prox  40      8               calcific and focaltortuous           +----------+--------+--------+--------+------------------+------------------+ ICA Distal55      9                                 tortuous           +----------+--------+--------+--------+------------------+------------------+ ECA       38      0                                                    +----------+--------+--------+--------+------------------+------------------+  +----------+--------+-------+----------------+-------------------+           PSV cm/sEDV cmsDescribe        Arm Pressure (mmHG) +----------+--------+-------+----------------+-------------------+ Subclavian100            Multiphasic, WNL                    +----------+--------+-------+----------------+-------------------+ +---------+--------+--+--------+-+----------------------------+ VertebralPSV cm/s27EDV cm/s3Antegrade and High resistant +---------+--------+--+--------+-+----------------------------+  Left Carotid Findings: +----------+--------+--------+--------+------------------+---------------------+           PSV cm/sEDV cm/sStenosisPlaque DescriptionComments              +----------+--------+--------+--------+------------------+---------------------+ CCA Prox  43      0                                                       +----------+--------+--------+--------+------------------+---------------------+ CCA Distal55      5                                 intimal thickening    +----------+--------+--------+--------+------------------+---------------------+ ICA Prox  74      13                                intimal thickening,  tortuous              +----------+--------+--------+--------+------------------+---------------------+ ICA Distal86      13                                tortuous              +----------+--------+--------+--------+------------------+---------------------+ ECA       31      0                                                       +----------+--------+--------+--------+------------------+---------------------+ +----------+--------+--------+----------------+-------------------+           PSV cm/sEDV cm/sDescribe        Arm Pressure (mmHG) +----------+--------+--------+----------------+-------------------+ [id number]             Multiphasic, WNL                     +----------+--------+--------+----------------+-------------------+ +---------+--------+--+--------+-+----------------------------+ VertebralPSV cm/s25EDV cm/s0Antegrade and High resistant +---------+--------+--+--------+-+----------------------------+   Summary: Right Carotid: The extracranial vessels were near-normal with only minimal wall                thickening or plaque. Left Carotid: The extracranial vessels were near-normal with only minimal wall               thickening or plaque. Vertebrals:  Bilateral vertebral arteries demonstrate antegrade flow. Bilateral              vertebral arteries demonstrate high resistant flow. Subclavians: Normal flow hemodynamics were seen in bilateral subclavian              arteries. *See table(s) above for measurements and observations.     Preliminary        LOS: 0 days   Barry Faircloth [employer] on [email or web address]  09/12/2020, 11:11 AM

## 2020-09-12 NOTE — Progress Notes (Signed)
  Echocardiogram 2D Echocardiogram has been performed.  Kristen Gray 09/12/2020, 4:44 PM

## 2020-09-12 NOTE — ED Provider Notes (Signed)
Discussed case with Dr. Conley Canal with cardiology.  He request to start heparin for potential ACS.  Patient is already on Eliquis.  Per pharmacy, they recommend holding heparin until the next dose of Eliquis is due.  Then start heparin without a bolus, drip only.  D/w dr Josephine Cables about this issue    Ripley Fraise, MD 09/12/20 [phone]

## 2020-09-12 NOTE — Consult Note (Signed)
Palliative Medicine Inpatient Consult Note  Reason for consult:  Goals of Care   HPI:  Per intake H&P  By Dr. Josephine Cables "Kristen Gray is a 75 y.o. female with medical history significant for dementia, hypertension, hyperlipidemia Crohn's and AFib on eliquis who presented to the ED via EMS from [place] memory care due to altered mental status.  Patient was unable to provide a history at bedside, she states that she does not know why she was brought to the ED.  History was obtained from ED physician and ED medical record.  Per report, patient was reported to have been confused for about 3 days and that he seems to be leaning more towards the right side.  She was reported to have some confusion at baseline, but she was presumed to have worsened confusion at this time.  There was no report of fever, chills, chest pain, shortness of breath, abdominal pain, nausea or vomiting. Patient was recently admitted from 7/8-7/13 due to acute metabolic encephalopathy and UTI which was treated with Levaquin x1 and ceftriaxone x2 from 7/9-7/11."   She is being worked up by Neurology and treated by Cardiology for suspected NSTEMI.   Clinical Assessment/Goals of Care: I have reviewed medical records including EPIC notes, labs and imaging, received report from bedside RN Enid Derry, assessed the patient. Patient has been agitated this afternoon and is currently in soft restraints. Currently she is calm watching TV and able to say her name but does not respond to other questions.    I met with patient's sister Kristen Gray via phone to further discuss diagnosis prognosis, [place], EOL wishes, disposition and options. [name] lives in [place]. She states the patient's friend Kristen Gray can receive information on patient, as well as Niece Kristen Gray and Kristen Gray. The patient has no living parents or other siblings. She has no children.    I introduced Palliative Medicine as specialized  medical care for people living with serious illness. It focuses on providing relief from the symptoms and stress of a serious illness. The goal is to improve quality of life for both the patient and the family.  Patient's sister shared that her sister loves music especially old country and Elvis music.  She has been in [place] for Dementia care at least 6 months. She has not been as responsive the last 3 days, not eating and drinking much.  A detailed discussion was had today regarding advanced directives.  Concepts specific to code status, artifical feeding and hydration, continued IV antibiotics and rehospitalization was had.  The difference between a aggressive medical intervention path  and a palliative comfort care path for this patient at this time was had. Values and goals of care important to patient and family were attempted to be elicited.    Ms. Kristen Gray states her sister would wish to be kept comfortable with no aggressive treatment. If possible she would like Hospice at [place].  She would elect antibiotics only if necessary for comfort and no artifical  feeding. The patient has dementia but has had no chronic pain, respiratory concerns or other symptoms to manage per her sister.  Discussed the importance of continued conversation with family and their  medical providers regarding overall plan of care and treatment options, ensuring decisions are within the context of the patients values and GOCs.  Decision Maker: MPOA, Kristen Gray, patient' sister.  SUMMARY OF RECOMMENDATIONS    Code Status/Advance Care Planning:  DNAR/DNI   Symptom Management:  Pain-  acetaminophen 500 mg every 6 hours prn. Breathlessness- oxygen prn Dementia- frequent orientation, calm environment. Can consider medication if needed.    Additional Recommendations (Limitations, Scope, Preferences): Comfort measures only Do not return to hospital Antibiotics only if indicated for comfort No artificial  feedings  Sister states friends Kristen Gray, Niece Kristen Gray and Kristen Gray can have information on patient.   Psycho-social/Spiritual:  Desire for further Chaplaincy support: Consult placed, patient is a [place] Additional Recommendations:   Prognosis: anticipated < 6 month survival, probably much shorter if she is not having good intake.  Discharge Planning: Hospice referral, preferably [facility] where she has resided most recently.    Vitals with BMI 09/12/2020 09/12/2020 09/12/2020  Height - - -  Weight - - -  BMI - - -  Systolic 961 - 164  Diastolic 73 - 51  Pulse 55 59 58   O2 sats 100% on 2 lpm  PPS: 10%   This conversation/these recommendations were discussed with patient primary care team, Dr. [place] Gray via secure chat.  Thank you for the opportunity to participate in the care of this patient and family.   Time In: 1:50 Time Out: 3:10 Total Time: >70 minutes Greater than 50%  of this time was spent counseling and coordinating care related to the above assessment and plan.  Lindell Spar, NP [facility] Palliative Medicine Team Team Cell Phone: [phone] Please utilize secure chat with additional questions, if there is no response within 30 minutes please call the above phone number  Palliative Medicine Team providers are available by phone from 7am to 7pm daily and can be reached through the team cell phone.  Should this patient require assistance outside of these hours, please call the patient's attending physician.

## 2020-09-12 NOTE — ED Notes (Signed)
Patient transported to MRI 

## 2020-09-12 NOTE — Consult Note (Addendum)
NEUROLOGY CONSULTATION NOTE   Date of service: September 12, 2020 Patient Name: Kristen Gray MRN:  [id number] DOB:  [birth year] Reason for consult: "Incidental punctate strokes" Requesting Provider: Bernadette Hoit, DO _ _ _   _ __   _ __ _ _  __ __   _ __   __ _  History of Present Illness  Kristen Gray is a 75 y.o. female with PMH significant for anxiety, afibb on eliquis, HTN, Crohn's dz, dementia, insomnia who presented to the ED from her memory care unit for 3 days of confusion.  Workup with elevated troponins and started on heparin gtt by cardiology for suspected NSTEMI. She had CT Head w/o contrast with no acute abnormalities. She had MRI Brain without contrast as part of workup which demonstrated a punctate foci of acute ischemia in left frontal and in R parietal lobe. No hemorrhage or mass effect.  Neurology consulted for further evaluation.  She opens eyes to voice, then goes right back to sleep. Does not anwer any of my questions.  Most of the history obtained from EMS run sheet and chart review. I also reviewed facility [facility] for her and seems like her last dose of Eliquis was on 09/11/20 in AM. Not missed doses on my review of MAR.  mRS: 5 tPA/thrombectomy: outside window NIHSS components Score: Comment  1a Level of Conscious 0[]  1[x]  2[]  3[]      1b LOC Questions 0[]  1[]  2[x]       1c LOC Commands 0[]  1[]  2[x]       2 Best Gaze 0[x]  1[]  2[]       3 Visual 0[x]  1[]  2[]  3[]      4 Facial Palsy 0[x]  1[]  2[]  3[]      5a Motor Arm - left 0[]  1[x]  2[]  3[]  4[]  UN[]    5b Motor Arm - Right 0[]  1[x]  2[]  3[]  4[]  UN[]    6a Motor Leg - Left 0[]  1[x]  2[]  3[]  4[]  UN[]    6b Motor Leg - Right 0[]  1[x]  2[]  3[]  4[]  UN[]    7 Limb Ataxia 0[x]  1[]  2[]  3[]  UN[]     8 Sensory 0[x]  1[]  2[]  UN[]      9 Best Language 0[]  1[]  2[]  3[x]      10 Dysarthria 0[x]  1[]  2[]  UN[]      11 Extinct. and Inattention 0[x]  1[]  2[]       TOTAL: 12      ROS   Unable to obtain ROS, PMH, PSH, Fhx, Shx, Allergies in the  setting of somnolence and advanced dementia.  Past History   Past Medical History:  Diagnosis Date   Anemia    Anxiety    Anxiety disorder    Anxiety disorder, unspecified    Arthritis    Atrial fibrillation (HCC)    Chronic fatigue    Chronic fatigue, unspecified    Cognitive communication deficit    Crohn's disease ([place])    Crohn's disease (regional enteritis) ([place])    Dementia ([place])    Depression    Essential hypertension    Gastroesophageal reflux disease without esophagitis    GERD (gastroesophageal reflux disease)    Heart murmur    Hypertension    Insomnia    Insomnia, unspecified    Major depression    Major depressive disorder    Muscle weakness (generalized)    Polyosteoarthritis    Reflux    Repeated falls    Vitamin B12 deficiency (dietary) anemia    Vitamin B12 deficiency anemia, unspecified    Vitamin D  deficiency    Vitamin D deficiency, unspecified    Past Surgical History:  Procedure Laterality Date   ANKLE SURGERY     CARDIOVERSION N/A 03/09/2020   Procedure: CARDIOVERSION;  Surgeon: Larey Dresser, MD;  Location: [facility] ENDOSCOPY;  Service: Cardiovascular;  Laterality: N/A;   CARDIOVERSION N/A 03/05/2020   Procedure: CARDIOVERSION;  Surgeon: Larey Dresser, MD;  Location: [facility] ENDOSCOPY;  Service: Cardiovascular;  Laterality: N/A;   CARPAL TUNNEL RELEASE     CHOLECYSTECTOMY     COLON SURGERY     RIGHT/LEFT HEART CATH AND CORONARY ANGIOGRAPHY N/A 09/15/2016   Procedure: RIGHT/LEFT HEART CATH AND CORONARY ANGIOGRAPHY;  Surgeon: Larey Dresser, MD;  Location: [place] [facility] LAB;  Service: Cardiovascular;  Laterality: N/A;   TEE WITHOUT CARDIOVERSION N/A 03/05/2020   Procedure: TRANSESOPHAGEAL ECHOCARDIOGRAM (TEE);  Surgeon: Larey Dresser, MD;  Location: [facility] ENDOSCOPY;  Service: Cardiovascular;  Laterality: N/A;   Family History  Problem Relation Age of Onset   Heart disease Mother        RF   Stroke Father    Diabetes Father    Hyperlipidemia  Father    Hypertension Father    Social History   Socioeconomic History   Marital status: Single    Spouse name: Not on file   Number of children: Not on file   Years of education: Not on file   Highest education level: Not on file  Occupational History   Occupation: retired  Tobacco Use   Smoking status: Never   Smokeless tobacco: Never  Vaping Use   Vaping Use: Unknown  Substance and Sexual Activity   Alcohol use: Not Currently   Drug use: Never   Sexual activity: Not Currently  Other Topics Concern   Not on file  Social History Narrative   ** Merged History Encounter **       ** Merged History Encounter **       ** Merged History Encounter **       ** Merged History Encounter **       Social Determinants of Radio broadcast assistant Strain: Not on file  Food Insecurity: Not on file  Transportation Needs: Not on file  Physical Activity: Not on file  Stress: Not on file  Social Connections: Not on file   Allergies  Allergen Reactions   Penicillin G Nausea Only    Has patient had a PCN reaction causing immediate rash, facial/tongue/throat swelling, SOB or lightheadedness with hypotension:No Has patient had a PCN reaction causing severe rash involving mucus membranes or skin necrosis: No Has patient had a PCN reaction that required hospitalization: No Has patient had a PCN reaction occurring within the last 10 years: No--NAUSEA ONLY If all of the above answers are "NO", then may proceed with Cephalosporin use.     Medications   Medications Prior to Admission  Medication Sig Dispense Refill Last Dose   acetaminophen (TYLENOL) 500 MG tablet Take 1,000 mg by mouth every 6 (six) hours as needed for mild pain.   unk   apixaban (ELIQUIS) 5 MG TABS tablet Take 1 tablet (5 mg total) by mouth 2 (two) times daily. 60 tablet 6 09/11/2020 at 0900   balsalazide (COLAZAL) 750 MG capsule Take 2 capsules (1,500 mg total) by mouth 2 (two) times daily. 120 capsule 11  09/11/2020   Calcium Carb-Cholecalciferol (CALCIUM-VITAMIN D3) 600-200 MG-UNIT TABS Take 2 tablets by mouth daily.   09/11/2020   Cholecalciferol (VITAMIN D3) 50 MCG (2000  UT) capsule TAKE 1 CAPSULE BY MOUTH EVERY DAY (Patient taking differently: Take 2,000 Units by mouth daily.) 30 capsule 3 09/11/2020   divalproex (DEPAKOTE SPRINKLE) 125 MG capsule Take 125 mg by mouth 2 (two) times daily.   09/11/2020   esomeprazole (NEXIUM) 20 MG capsule Take 20 mg by mouth daily.   09/11/2020   flecainide (TAMBOCOR) 100 MG tablet Take 1 tablet (100 mg total) by mouth every 12 (twelve) hours. 60 tablet 6 09/11/2020   LORazepam (ATIVAN) 0.5 MG tablet Take 1 tablet (0.5 mg total) by mouth every 12 (twelve) hours as needed for anxiety. 3 tablet 0 09/08/2020   Metoprolol Succinate 50 MG CS24 Take 50 mg by mouth daily. Metoprolol Succinate Capsule ER 24 hour sprinkle 64m. HOLD for HR <60bpm.   09/11/2020 at 0900   sertraline (ZOLOFT) 50 MG tablet Take 50 mg by mouth at bedtime.   09/10/2020   traZODone (DESYREL) 50 MG tablet Take 50 mg by mouth at bedtime.   09/11/2020   Venlafaxine HCl 75 MG TB24 Take 75 mg by mouth every other day.   09/10/2020   vitamin B-12 (CYANOCOBALAMIN) 1000 MCG tablet Take 1 tablet (1,000 mcg total) by mouth daily. 30 tablet 3 09/11/2020   Zinc Oxide 22 % CREA Apply 1 application topically 3 (three) times daily. Red rash on buttocks   09/11/2020     Vitals   Vitals:   09/12/20 0100 09/12/20 0222 09/12/20 0352 09/12/20 0416  BP: (!) 150/65 (!) 147/62 (!) 127/46 134/76  Pulse: (!) 50 (!) 51 84 (!) 51  Resp: 15 14 18 18   Temp:  98.1 F (36.7 C) 97.8 F (36.6 C) 97.8 F (36.6 C)  TempSrc:  Oral Axillary Oral  SpO2: 100% 98% 98% 98%  Weight:    67.5 kg     Body mass index is 25.54 kg/m.  Physical Exam   General: Laying comfortably in bed; in no acute distress.  HENT: Normal oropharynx and mucosa. Normal external appearance of ears and nose.  Neck: Supple, no pain or tenderness  CV: No  JVD. No peripheral edema.  Pulmonary: Symmetric Chest rise. Normal respiratory effort.  Abdomen: Soft to touch, non-tender.  Ext: No cyanosis, edema, or deformity  Skin: No rash. Normal palpation of skin.   Musculoskeletal: Normal digits and nails by inspection. No clubbing.   Neurologic Examination  Mental status/Cognition: somnolent, opens eyes to voice, social smile. Does not answer any orientation questions. Speech/language: no speech on my evaluation. Cranial nerves:   CN II Pupils equal and reactive to light, blinks to threat BL   CN III,IV,VI Looks at my face on the left as well as on the right.   CN V    CN VII no asymmetry, no nasolabial fold flattening   CN VIII Turns head towards speech   CN IX & X    CN XI    CN XII    Motor:  Muscle bulk: poor, tone normal. Unable to do detailed strength testing secondary to somnolence and poor participation. Moves BL upper extremities sponaneously and antigravity. Withdraws BL lower extremities to babinskis with antigravity movements  Reflexes:  Right Left Comments  Pectoralis      Biceps (C5/6) 1 1   Brachioradialis (C5/6) 1 1    Triceps (C6/7) 1 1    Patellar (L3/4) 1 1    Achilles (S1)      Hoffman      Plantar     Jaw jerk  Sensation:  Light touch Localizes to mild nailbed pressure in all extremities.   Pin prick    Temperature    Vibration   Proprioception    Coordination/Complex Motor:  Unable to assess but no obvious ataxia or incoordination.  Labs   CBC:  Recent Labs  Lab 09/11/20 1925 09/12/20 0307  WBC 14.4* 11.1*  NEUTROABS 11.9*  --   HGB 9.6* 9.0*  HCT 31.6* 29.5*  MCV 83.6 82.9  PLT 223 161    Basic Metabolic Panel:  Lab Results  Component Value Date   NA 133 (L) 09/12/2020   K 3.7 09/12/2020   CO2 24 09/12/2020   GLUCOSE 114 (H) 09/12/2020   BUN 27 (H) 09/12/2020   CREATININE 1.32 (H) 09/12/2020   CALCIUM 8.5 (L) 09/12/2020   GFRNONAA 42 (L) 09/12/2020   GFRAA 46 (L) 09/16/2019    Lipid Panel:  Lab Results  Component Value Date   LDLCALC 51 03/28/2015   HgbA1c:  Lab Results  Component Value Date   HGBA1C 4.8 02/28/2017   Urine Drug Screen: No results found for: LABOPIA, COCAINSCRNUR, LABBENZ, AMPHETMU, THCU, LABBARB  Alcohol Level     Component Value Date/Time   ETH <10 08/26/2020 0216    CT Head without contrast: Personally reviewed and CTH was negative for a large hypodensity concerning for a large territory infarct or hyperdensity concerning for an ICH  MR Angio head without contrast and Carotid Duplex BL: pending  MRI Brain: Personally reviewed and notable for punctate foci of acute ischemia in the left frontal lobe and right parietal lobe.  Impression   Lurena Naeve is a 75 y.o. female with PMH significant for anxiety, afibb on eliquis, HTN, Crohn's dz, dementia, insomnia who presented to the ED from her memory care unit for 3 days of confusion. Admitted with NSTEMI and on heparin and MRI shows 2 incidental punctate infarcts. Her neurologic examination is notable for no focal deficit, notable for cognitive deficit and delirium.  Etiology unclear embolic vs small vessel. She is already on maximal medical therapy with eliquis.  Could get vessel imaging, and if very significant atherosclerosis can consider adding antiplatelet agent. Full stroke workup otherwise is unlikely to change much in terms of management.  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   Secondary Diagnosis: Essential (primary) hypertension and Paroxysmal atrial fibrillation  Recommendations  - MR angio head w/o contrast - Vasc US carotid duplex - LDL - HbA1c. - Q4H Neuro checks - NPO until she passes swallow - Telemetry. - SBP goal - permissive hypertension first 24 h < 220/110. Held home meds.  - Stroke education booklet - Recommend PT/OT/SLP consult - Okay to continue Heparin from a Neuro stand point. - Can  transition back to Eliquis when heparin is discontinued.  ______________________________________________________________________   Thank you for the opportunity to take part in the care of this patient. If you have any further questions, please contact the neurology consultation attending.  Signed,  [place] Pager Number [phone] _ _ _   _ __   _ __ _ _  __ __   _ __   __ _

## 2020-09-12 NOTE — Evaluation (Signed)
Occupational Therapy Evaluation Patient Details Name: Kristen Gray MRN: [id number] DOB: [birth year] Today's Date: 09/12/2020    History of Present Illness This 75 y.o. female admitted from [place] care unit with AMS.  MRI of brain showed Punctate foci of acute ischemia in the left frontal lobe and  right parietal lobe.  She was also found to have elevated troponin and T wave changes > NSTEMI.  Also noted with acute metabolic encephalopathy in setting of dementia, CKD stage III.  PMH includes:  Dementia, A-Fib, HTN, Chrohn's diseases, anxiety,   Clinical Impression   Pt admitted with above. She demonstrates the below listed deficits and will benefit from continued OT to maximize safety and independence with BADLs.  Pt was able to follow one step simple commands ~30% of the time.  She was able to wash her face and assist minimally with rolling Lt and Rt.  She was able to tell me her name when asked.  Overall, she requires max - total A for ADLs.  I was unable to safely move her to EOB with +1 assist this date due to restlessnees, agitation, and confusion.  PTA, pt was residing at [facility].  I anticipate she will require SNF placement, unless [facility] is able to accommodate her current needs/status.  If they are, she would benefit from return to ALF as she is familiar with the environment and the staff.  Will follow acutely.      Follow Up Recommendations  SNF;Other (comment) (vs. return to ALF with increased level of assist if they are able to accommodate her needs)    Equipment Recommendations  None recommended by OT    Recommendations for Other Services       Precautions / Restrictions Precautions Precautions: Fall Precaution Comments: pt in bil. UE wrist restraints and bil. mittens due to confusion      Mobility Bed Mobility Overal bed mobility: Needs Assistance Bed Mobility: Rolling Rolling: Mod assist;Max assist         General bed mobility comments: required  mod A to roll to Rt and max A to roll to Lt    Transfers                 General transfer comment: unable to safely attempt with +1 assist    Balance                                           ADL either performed or assessed with clinical judgement   ADL Overall ADL's : Needs assistance/impaired Eating/Feeding: NPO   Grooming: Wash/dry face;Minimal assistance;Bed level Grooming Details (indicate cue type and reason): Pt able to wash face on command once physical prompt provided to initiate task Upper Body Bathing: Total assistance   Lower Body Bathing: Total assistance;Bed level   Upper Body Dressing : Total assistance;Bed level   Lower Body Dressing: Total assistance;Bed level   Toilet Transfer: Total assistance Toilet Transfer Details (indicate cue type and reason): unable Toileting- Clothing Manipulation and Hygiene: Total assistance;Bed level Toileting - Clothing Manipulation Details (indicate cue type and reason): Pt required max A to roll Lt and Rt to perform peri care due to pt soiled     Functional mobility during ADLs: Maximal assistance (for bed mobility)       Vision   Additional Comments: pt spontaneously tracks Lt and Rt.  She was unable to  participate in formal assessment     Perception Perception Perception Tested?: No   Praxis Praxis Praxis tested?: Deficits Deficits: Perseveration Praxis-Other Comments: perseveration noted with washing face    Pertinent Vitals/Pain Pain Assessment: Faces Faces Pain Scale: Hurts little more Pain Location: pt irritable with periods of agitation.  Unable to determine if due to pain Pain Descriptors / Indicators: Restless Pain Intervention(s): Monitored during session     Hand Dominance  (uncertain)   Extremity/Trunk Assessment Upper Extremity Assessment Upper Extremity Assessment: Generalized weakness (pt moving bil. UEs spontaneously and trying to pull mitts off.  She was able to  manipulate washcloth with Rt UE to wash her face)   Lower Extremity Assessment Lower Extremity Assessment: Defer to PT evaluation       Communication Communication Communication: Expressive difficulties;Receptive difficulties (Pt able to state her name, and tell me she needs to pee)   Cognition Arousal/Alertness: Awake/alert Behavior During Therapy: Restless;Agitated;Anxious Overall Cognitive Status: No family/caregiver present to determine baseline cognitive functioning                                 General Comments: Pt able to tell me her name, and that she needs to pee.  She was able to wash her face on command once she was provided with physical prompts to initiate activity.  she followed one step, simple motor commands ~30% of the time   General Comments  VSS    Exercises     Shoulder Instructions      Home Living Family/patient expects to be discharged to:: Assisted living                                 Additional Comments: Pt was residing in ALF - memory care unit PTA      Prior Functioning/Environment Level of Independence: Needs assistance        Comments: Pt unable to provide info        OT Problem List: Decreased activity tolerance;Impaired balance (sitting and/or standing);Decreased cognition;Decreased safety awareness      OT Treatment/Interventions: Self-care/ADL training;DME and/or AE instruction;Therapeutic activities;Cognitive remediation/compensation;Patient/family education;Balance training;Visual/perceptual remediation/compensation;Neuromuscular education    OT Goals(Current goals can be found in the care plan section) Acute Rehab OT Goals OT Goal Formulation: Patient unable to participate in goal setting Time For Goal Achievement: 09/25/20 Potential to Achieve Goals: Fair  OT Frequency: Min 2X/week   Barriers to D/C: Decreased caregiver support          Co-evaluation              AM-PAC OT "6 Clicks"  Daily Activity     Outcome Measure Help from another person eating meals?: Total Help from another person taking care of personal grooming?: A Lot Help from another person toileting, which includes using toliet, bedpan, or urinal?: Total Help from another person bathing (including washing, rinsing, drying)?: Total Help from another person to put on and taking off regular upper body clothing?: Total Help from another person to put on and taking off regular lower body clothing?: Total 6 Click Score: 7   End of Session Nurse Communication: Mobility status  Activity Tolerance: Other (comment) (cognition) Patient left: in bed;with call bell/phone within reach;with bed alarm set;with nursing/sitter in room;with restraints reapplied  OT Visit Diagnosis: Cognitive communication deficit (R41.841) Symptoms and signs involving cognitive functions: Cerebral infarction  Time: 6646-6056 OT Time Calculation (min): 21 min Charges:  OT General Charges $OT Visit: 1 Visit OT Evaluation $OT Eval Moderate Complexity: 1 Mod  Nilsa Nutting., OTR/L Acute Rehabilitation Services Pager [phone] Office [phone]   Lucille Passy M 09/12/2020, 1:51 PM

## 2020-09-12 NOTE — H&P (Signed)
History and Physical  Kristen Gray YIR:[id number] DOB: [birth year] DOA: 09/11/2020  Referring physician: Ripley Fraise, MD PCP: Garwin Brothers, MD  Patient coming from: [facility]  Chief Complaint: Altered mental status  HPI: Kristen Gray is a 75 y.o. female with medical history significant for dementia, hypertension, hyperlipidemia Crohn's and AFib on eliquis who presented to the ED via EMS from [place] [facility] due to altered mental status.  Patient was unable to provide a history at bedside, she states that she does not know why she was brought to the ED.  History was obtained from ED physician and ED medical record.  Per report, patient was reported to have been confused for about 3 days and that he seems to be leaning more towards the right side.  She was reported to have some confusion at baseline, but she was presumed to have worsened confusion at this time.  There was no report of fever, chills, chest pain, shortness of breath, abdominal pain, nausea or vomiting. Patient was recently admitted from 7/8-7/13 due to acute metabolic encephalopathy and UTI which was treated with Levaquin x1 and ceftriaxone x2 from 7/9-7/11.  ED Course:  In the emergency department, she was bradycardic, BP was 116/52, O2 sat was 98 to 100% on room air.  Work-up in the ED showed leukocytosis, normocytic anemia, troponin x2-147 > 134.  BMP showed hyponatremia, BUN/creatinine 27/1.32 (baseline creatinine at 1.0-1.4), hypoalbuminemia, elevated AST, BNP 1836.5. Chest x-ray showed cardiomegaly with no active disease CT of head without contrast showed no acute intracranial pathology MRI head without contrast showed Punctate foci of acute ischemia in the left frontal lobe and right parietal lobe. No hemorrhage or mass effect. Aspirin was given, cardiology was consulted and Heparin was recommended to be started when next dose of Eliquis is due.  Review of Systems: This cannot be obtained at this time  due to patient's underlying dementia  Past Medical History:  Diagnosis Date   Anemia    Anxiety    Anxiety disorder    Anxiety disorder, unspecified    Arthritis    Atrial fibrillation (HCC)    Chronic fatigue    Chronic fatigue, unspecified    Cognitive communication deficit    Crohn's disease ([place])    Crohn's disease (regional enteritis) ([place])    Dementia ([place])    Depression    Essential hypertension    Gastroesophageal reflux disease without esophagitis    GERD (gastroesophageal reflux disease)    Heart murmur    Hypertension    Insomnia    Insomnia, unspecified    Major depression    Major depressive disorder    Muscle weakness (generalized)    Polyosteoarthritis    Reflux    Repeated falls    Vitamin B12 deficiency (dietary) anemia    Vitamin B12 deficiency anemia, unspecified    Vitamin D deficiency    Vitamin D deficiency, unspecified    Past Surgical History:  Procedure Laterality Date   ANKLE SURGERY     CARDIOVERSION N/A 03/09/2020   Procedure: CARDIOVERSION;  Surgeon: Larey Dresser, MD;  Location: [facility];  Service: Cardiovascular;  Laterality: N/A;   CARDIOVERSION N/A 03/05/2020   Procedure: CARDIOVERSION;  Surgeon: Larey Dresser, MD;  Location: [facility] ENDOSCOPY;  Service: Cardiovascular;  Laterality: N/A;   CARPAL TUNNEL RELEASE     CHOLECYSTECTOMY     COLON SURGERY     RIGHT/LEFT HEART CATH AND CORONARY ANGIOGRAPHY N/A 09/15/2016   Procedure: RIGHT/LEFT HEART CATH  AND CORONARY ANGIOGRAPHY;  Surgeon: Larey Dresser, MD;  Location: [facility] LAB;  Service: Cardiovascular;  Laterality: N/A;   TEE WITHOUT CARDIOVERSION N/A 03/05/2020   Procedure: TRANSESOPHAGEAL ECHOCARDIOGRAM (TEE);  Surgeon: Larey Dresser, MD;  Location: [facility] ENDOSCOPY;  Service: Cardiovascular;  Laterality: N/A;    Social History:  reports that she has never smoked. She has never used smokeless tobacco. She reports that she does not currently use alcohol. She reports that she  does not use drugs.   Allergies  Allergen Reactions   Penicillin G Nausea Only    Has patient had a PCN reaction causing immediate rash, facial/tongue/throat swelling, SOB or lightheadedness with hypotension:No Has patient had a PCN reaction causing severe rash involving mucus membranes or skin necrosis: No Has patient had a PCN reaction that required hospitalization: No Has patient had a PCN reaction occurring within the last 10 years: No--NAUSEA ONLY If all of the above answers are "NO", then may proceed with Cephalosporin use.     Family History  Problem Relation Age of Onset   Heart disease Mother        RF   Stroke Father    Diabetes Father    Hyperlipidemia Father    Hypertension Father       Prior to Admission medications   Medication Sig Start Date End Date Taking? Authorizing Provider  acetaminophen (TYLENOL) 500 MG tablet Take 1,000 mg by mouth every 6 (six) hours as needed for mild pain.   Yes [provider]  apixaban (ELIQUIS) 5 MG TABS tablet Take 1 tablet (5 mg total) by mouth 2 (two) times daily. 03/09/20  Yes Clegg, Amy D, NP  balsalazide (COLAZAL) 750 MG capsule Take 2 capsules (1,500 mg total) by mouth 2 (two) times daily. 01/30/20  Yes Zehr, Laban Emperor, PA-C  Calcium Carb-Cholecalciferol (CALCIUM-VITAMIN D3) 600-200 MG-UNIT TABS Take 2 tablets by mouth daily.   Yes [provider]  Cholecalciferol (VITAMIN D3) 50 MCG (2000 UT) capsule TAKE 1 CAPSULE BY MOUTH EVERY DAY Patient taking differently: Take 2,000 Units by mouth daily. 01/20/20  Yes [place], Betty G, MD  divalproex (DEPAKOTE SPRINKLE) 125 MG capsule Take 125 mg by mouth 2 (two) times daily.   Yes [provider]  esomeprazole (NEXIUM) 20 MG capsule Take 20 mg by mouth daily.   Yes [provider]  flecainide (TAMBOCOR) 100 MG tablet Take 1 tablet (100 mg total) by mouth every 12 (twelve) hours. 03/09/20  Yes Clegg, Amy D, NP  LORazepam (ATIVAN) 0.5 MG tablet Take 1 tablet  (0.5 mg total) by mouth every 12 (twelve) hours as needed for anxiety. 08/03/20  Yes Patrecia Pour, MD  Metoprolol Succinate 50 MG CS24 Take 50 mg by mouth daily. Metoprolol Succinate Capsule ER 24 hour sprinkle 19m. HOLD for HR <60bpm. 08/03/20  Yes GPatrecia Pour MD  sertraline (ZOLOFT) 50 MG tablet Take 50 mg by mouth at bedtime. 05/31/20  Yes [provider]  traZODone (DESYREL) 50 MG tablet Take 50 mg by mouth at bedtime.   Yes [provider]  Venlafaxine HCl 75 MG TB24 Take 75 mg by mouth every other day.   Yes [provider]  vitamin B-12 (CYANOCOBALAMIN) 1000 MCG tablet Take 1 tablet (1,000 mcg total) by mouth daily. 01/20/20  Yes JMartinique Betty G, MD  Zinc Oxide 22 % CREA Apply 1 application topically 3 (three) times daily. Red rash on buttocks   Yes [provider]    Physical  Exam: BP 134/76 (BP Location: Left Arm)   Pulse (!) 51   Temp 97.8 F (36.6 C) (Oral)   Resp 18   Wt 67.5 kg   SpO2 98%   BMI 25.54 kg/m   General: 75 y.o. year-old female ill appearing but in no acute distress.  Alert and states that she does not know why she was brought to the hospital. HEENT: NCAT, EOMI Neck: Supple, trachea medial Cardiovascular: Regular rate and rhythm with no rubs or gallops.  No thyromegaly or JVD noted.  No lower extremity edema. 2/4 pulses in all 4 extremities. Respiratory: Clear to auscultation with no wheezes or rales. Good inspiratory effort. Abdomen: Soft, nontender nondistended with normal bowel sounds x4 quadrants. Muskuloskeletal: No cyanosis, clubbing or edema noted bilaterally Neuro: sensation intact, no focal neurologic deficit Skin: No ulcerative lesions noted or rashes Psychiatry: Mood is appropriate for condition and setting          Labs on Admission:  Basic Metabolic Panel: Recent Labs  Lab 09/11/20 1925 09/12/20 0307  NA 131* 133*  K 3.9 3.7  CL 97* 99  CO2 22 24  GLUCOSE 141* 114*  BUN 27* 27*  CREATININE 1.36*  1.32*  CALCIUM 8.5* 8.5*  MG  --  1.6*  PHOS  --  4.0   Liver Function Tests: Recent Labs  Lab 09/11/20 1925 09/12/20 0307  AST 93* 73*  ALT 13 14  ALKPHOS 105 104  BILITOT 1.0 1.3*  PROT 5.9* 5.6*  ALBUMIN 2.6* 2.5*   No results for input(s): LIPASE, AMYLASE in the last 168 hours. No results for input(s): AMMONIA in the last 168 hours. CBC: Recent Labs  Lab 09/11/20 1925 09/12/20 0307  WBC 14.4* 11.1*  NEUTROABS 11.9*  --   HGB 9.6* 9.0*  HCT 31.6* 29.5*  MCV 83.6 82.9  PLT 223 206   Cardiac Enzymes: No results for input(s): CKTOTAL, CKMB, CKMBINDEX, TROPONINI in the last 168 hours.  BNP (last 3 results) Recent Labs    03/04/20 1801 09/11/20 0307  BNP 877.8* 1,836.5*    ProBNP (last 3 results) No results for input(s): PROBNP in the last 8760 hours.  CBG: No results for input(s): GLUCAP in the last 168 hours.  Radiological Exams on Admission: CT HEAD WO CONTRAST (5MM)  Result Date: 09/11/2020 CLINICAL DATA:  Altered mental status. EXAM: CT HEAD WITHOUT CONTRAST TECHNIQUE: Contiguous axial images were obtained from the base of the skull through the vertex without intravenous contrast. COMPARISON:  Head CT dated 08/30/2020. FINDINGS: Brain: There is mild age-related atrophy and chronic microvascular ischemic changes. There is no acute intracranial hemorrhage. No mass effect or midline shift. No extra-axial fluid collection. Vascular: No hyperdense vessel or unexpected calcification. Skull: Normal. Negative for fracture or focal lesion. Sinuses/Orbits: No acute finding. Other: None IMPRESSION: 1. No acute intracranial pathology. 2. Mild age-related atrophy and chronic microvascular ischemic changes. Electronically Signed   By: Anner Crete M.D.   On: 09/11/2020 20:35   MR BRAIN WO CONTRAST  Result Date: 09/12/2020 CLINICAL DATA:  Acute neurologic deficit EXAM: MRI HEAD WITHOUT CONTRAST TECHNIQUE: Multiplanar, multiecho pulse sequences of the brain and  surrounding structures were obtained without intravenous contrast. COMPARISON:  None. FINDINGS: Brain: Punctate foci of abnormal diffusion restriction in the left frontal lobe and right parietal lobe. No acute or chronic hemorrhage. Hyperintense T2-weighted signal is moderately widespread throughout the white matter. Generalized volume loss without a clear lobar predilection. The midline structures are normal. Vascular: Major flow voids are  preserved. Skull and upper cervical spine: Normal calvarium and skull base. Visualized upper cervical spine and soft tissues are normal. Sinuses/Orbits:No paranasal sinus fluid levels or advanced mucosal thickening. No mastoid or middle ear effusion. Normal orbits. IMPRESSION: 1. Punctate foci of acute ischemia in the left frontal lobe and right parietal lobe. No hemorrhage or mass effect. 2. Moderate chronic small vessel ischemic disease and generalized volume loss. Electronically Signed   By: Ulyses Jarred M.D.   On: 09/12/2020 02:04   DG Chest Portable 1 View  Result Date: 09/11/2020 CLINICAL DATA:  Chest pain EXAM: PORTABLE CHEST 1 VIEW COMPARISON:  08/30/2020 FINDINGS: Cardiomegaly.  No focal opacity, pleural effusion or pneumothorax. IMPRESSION: No active disease.  Cardiomegaly. Electronically Signed   By: Donavan Foil M.D.   On: 09/11/2020 21:01    EKG: I independently viewed the EKG done and my findings are as followed: Sinus bradycardia at a rate of 54 bpm with prolonged PR interval and T wave inversion in several leads.  QTc 519 ms  Assessment/Plan Present on Admission:  NSTEMI (non-ST elevated myocardial infarction) ([place])  Hypertension, essential  Crohn disease ([place])  Atrial fibrillation ([place])  Active Problems:   Hypertension, essential   Crohn disease ([place])   Atrial fibrillation (HCC)   NSTEMI (non-ST elevated myocardial infarction) (HCC)   Elevated troponin   Leukocytosis   Hypomagnesemia   Hyponatremia   Chronic kidney disease    Hypoalbuminemia   Elevated AST (SGOT)   Altered mental status   Prolonged QT interval   Hyperlipidemia  Elevated troponin rule out NSTEMI Troponin x2- 147 > 134 EKG shows T wave inversion in several leads TIMI score = 4 Cardiology was already consulted by ED physician and following; patient may require revascularization per cardiology medical record Heparin drip recommended when next Eliquis dose is due  Elevated BNP rule out CHF BNP 1836.5 Continue total input/output, daily weights and fluid restriction Continue Cardiac diet  EKG personally reviewed showed Sinus bradycardia at a rate of 54 bpm with prolonged PR interval and T wave inversion in several leads.  QTc 519 ms Echocardiogram in the morning   Altered mental status rule out acute ischemic stroke CT head without contrast showed no acute intracranial pathology MRI of head without contrast showed punctate foci of acute ischemia in the left frontal lobe and right parietal lobe. Neurology was consulted and we shall await further recommendation.  Leukocytosis possibly reactive WBC 14.4; no obvious source for any acute infectious process at this time Continue to monitor WBC with morning labs  Hypomagnesemia Mg 1.6; this will be replenished  Hyponatremia Na 133, continue to monitor sodium levels  CKD stage 3B BUN/creatinine 27/1.32 (baseline creatinine at 1.0-1.4) Renally adjust medications, avoid nephrotoxic agents/dehydration/hypotension  Hypoalbuminemia possible secondary to moderate protein calorie malnutrition Albumin 2.5, protein supplement to be provided  Prolonged QTc (519 ms) Avoid QT prolonging drugs Magnesium level will be replenished Repeat EKG in the morning  Hyperlipidemia No antihyperlipidemic medication noted in patient's med rec  Elevated AST AST 73; continue to monitor liver enzymes  Hypertension Atrial fibrillation CHA2DS2-VASc score = 4 Eliquis will be held at this time, heparin will be  started per cardiology recommendation (stated by ED physician)   Crohn's disease Stable, continue Colazal  DVT prophylaxis: SCDs, heparin drip will be started when next dose of Eliquis is due  Code Status: Full code  Family Communication: None at bedside  Disposition Plan:  Patient is from:  home Anticipated DC to:                   SNF or family members home Anticipated DC date:               2-3 days Anticipated DC barriers:          Patient requires inpatient management due to possible NSTEMI and CHF    Consults called: Cardiology  Admission status: Inpatient    Bernadette Hoit MD [facility]   09/12/2020, 5:00 AM

## 2020-09-13 NOTE — TOC Initial Note (Signed)
Transition of Care [facility]) - Initial/Assessment Note    Patient Details  Name: Kristen Gray MRN: [id number] Date of Birth: 01-18-1946  [facility] CM/SW Contact:    Bary Castilla, LCSW Phone Number: [phone] 09/13/2020, 3:44 PM  Clinical Narrative:                  CSW spoke with pt's sister(Mary) to address the consult for hospice and the SNF recommendation. Stanton Kidney explained that she wishes for pt to return to [facility] with hospice and they should be able to provide therapy if needed as well. She explained that she does not want pt to go to another facility. CSW explained that pt would not be able to receive hospice at a SNF placement. CSW explained that follow up would occur to [facility] tomorrow due to being able to reach someone about DC plans.  TOC team will continue to assist with discharge planning needs.    Expected Discharge Plan: Memory Care Barriers to Discharge: Continued Medical Work up   Patient Goals and CMS Choice        Expected Discharge Plan and Services Expected Discharge Plan: Memory Care       Living arrangements for the past 2 months: [place]                                      Prior Living Arrangements/Services Living arrangements for the past 2 months: [place] Lives with:: Facility Resident            Care giver support system in place?: Yes (comment)      Activities of Daily Living      Permission Sought/Granted         Permission granted to share info w AGENCY: [place]  Permission granted to share info w Relationship: POA sister  Permission granted to share info w Contact Information: [phone]  Emotional Assessment Appearance:: Other (Comment Required Attitude/Demeanor/Rapport: Unable to Assess Affect (typically observed): Unable to Assess Orientation: : Oriented to Self      Admission diagnosis:  NSTEMI (non-ST elevated myocardial infarction) ([place])  [I21.4] Altered mental status, unspecified altered mental status type [R41.82] Leukocytosis, unspecified type [D72.829] Patient Active Problem List   Diagnosis Date Noted   NSTEMI (non-ST elevated myocardial infarction) ([place]) 09/12/2020   Elevated troponin 09/12/2020   Leukocytosis 09/12/2020   Hypomagnesemia 09/12/2020   Hyponatremia 09/12/2020   Chronic kidney disease 09/12/2020   Hypoalbuminemia 09/12/2020   Elevated AST (SGOT) 09/12/2020   Altered mental status 09/12/2020   Prolonged QT interval 09/12/2020   Hyperlipidemia 09/12/2020   Elevated brain natriuretic peptide (BNP) level 09/12/2020   Cerebral embolism with cerebral infarction 09/12/2020   Acute on chronic combined systolic and diastolic CHF (congestive heart failure) ([place])    UTI (urinary tract infection) 07/31/2020   Atrial fibrillation with RVR ([place]) 03/05/2020   Atrial fibrillation ([place]) 03/05/2020   AKI (acute kidney injury) ([place])    Moderate episode of recurrent major depressive disorder ([place]) 09/16/2019   Crohn disease ([place]) 02/25/2019   Chronic fatigue 02/25/2019   Vitamin B 12 deficiency 02/25/2019   Unilateral primary osteoarthritis, left knee 03/27/2018   Hypokalemia 02/20/2018   GERD (gastroesophageal reflux disease) 07/28/2017   Unilateral primary osteoarthritis, right knee 01/31/2017   Sebaceous cyst 10/21/2016   Anemia 10/20/2016   Primary osteoarthritis of right hip 09/12/2016   Pulmonary  hypertension ([place]) 07/26/2016   OSA (obstructive sleep apnea) 07/26/2016   Morbid obesity ([place]) 04/14/2016   Hypertension, essential 04/13/2016   Vitamin D deficiency 04/13/2016   Insomnia 04/13/2016   Generalized anxiety disorder 04/13/2016   Major depression 04/13/2016   Osteoarthritis, 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/13/2016   Dyspnea 10/07/2015   PCP:  Garwin Brothers, MD Pharmacy:   [facility] [place], [place] - [address] [place] [address] Phone: [phone] Fax:  [phone]     Social Determinants of Health (SDOH) Interventions    Readmission Risk Interventions No flowsheet data found.

## 2020-09-13 NOTE — Progress Notes (Signed)
[facility] PROGRESS NOTE   Kristen Gray LSL:[id number] DOB: Sep 21, 1945 DOA: 09/11/2020  PCP: Garwin Brothers, MD  Brief History/Interval Summary:  75 y.o. female with medical history significant for dementia, hypertension, hyperlipidemia Crohn's and AFib on eliquis who presented to the ED via EMS from [facility] due to altered mental status.  Due to her dementia patient was unable to provide any information.  Patient was recently hospitalized in July due to acute metabolic encephalopathy from a urinary tract infection and was treated with IV antibiotics.  Evaluation in the ED raised concern for new acute stroke along with elevated troponin and BNP raising concern for acute coronary syndrome.     Consultants: Cardiology.  Neurology.  Palliative care  Procedures: Transthoracic echocardiogram  Antibiotics: Anti-infectives (From admission, onward)    None       Subjective/Interval History: Patient noted to be slightly more responsive today compared to yesterday.  Answered a few questions.  Remains distracted.  Does not appear to be in any pain.     Assessment/Plan:  Non-ST elevation MI/diastolic dysfunction/pulmonary hypertension Troponins were noted to be elevated.  EKG showed significant T wave changes in several leads.  Cardiology was consulted.  Patient placed on heparin infusion.   Echocardiogram showed EF of 47% with grade 2 diastolic dysfunction.  There was also evidence for moderate to severe pulmonary hypertension. After discussions with family and palliative care patient was transition to hospice/comfort care.  She is not thought to be a good candidate for aggressive interventions and procedures due to her advanced dementia which has been progressively getting worse per family.  Heparin was discontinued.  At this time it is reasonable to continue with her flecainide and metoprolol.    Acute stroke Patient with history of atrial fibrillation on anticoagulation.   Noted to have 2 acute infarcts.  Seen by neurology.  Underwent MRA head and carotid Dopplers.  No significant carotid stenosis noted.  No significant stenosis in the intracranial vessels noted.  Since patient was transitioned to hospice further work-up was discontinued.    Acute metabolic encephalopathy in the setting of dementia According to her sister patient's dementia has been progressively worsening over the past several months.  She is also been declining from a physical activity standpoint. Seems to be stable this morning.  Slightly more responsive.  Chronic kidney disease stage IIIb/hyponatremia/hypomagnesemia Monitor renal function closely.  Monitor urine output.  Replace magnesium.  Prolonged QTC Avoid QT prolonging medications.  Normocytic anemia No evidence for blood loss.  Continue to monitor.  Paroxysmal atrial fibrillation Eliquis has been discontinued now that the plan is for her more comfort strategy and hospice services.  History of Crohn's disease Seems to be stable.  Leukocytosis Appears to be reactive.  Goals of care Patient seen by palliative care.  Discussions were held with patient's sister who is her power of attorney.  Patient transition to hospice/comfort care.  Appears to have stabilized some.  Will need to see how her oral intake is before deciding what kind of disposition she is going to need.  If she does a reasonably good job of eating and drinking then may need to consider skilled nursing facility for rehab with hospice eventually.  Or perhaps she can go back to her assisted living facility with hospice if they are able to care for her in that setting.     DVT Prophylaxis: Comfort care/hospice Code Status: DNR Family Communication: Discussed with patient's sister yesterday.   Disposition Plan: From  a memory care unit.  To be determined.  Status is: Inpatient  Remains inpatient appropriate because:Altered mental status, Ongoing diagnostic testing  needed not appropriate for outpatient work up, and Inpatient level of care appropriate due to severity of illness  Dispo: The patient is from:  Memory care unit              Anticipated d/c is to:  To be determined              Patient currently is not medically stable to d/c.   Difficult to place patient No       Medications: Scheduled:  balsalazide  1,500 mg Oral BID   feeding supplement  237 mL Oral BID BM   flecainide  75 mg Oral Q12H   metoprolol succinate  50 mg Oral Daily   Continuous:   PRN:   Objective:  Vital Signs  Vitals:   09/12/20 1630 09/12/20 1645 09/12/20 1700 09/13/20 0744  BP:  (!) 159/63  (!) 187/64  Pulse: (!) 55 (!) 55 (!) 56 (!) 56  Resp: (!) 27 (!) 24 15 17   Temp:    98 F (36.7 C)  TempSrc:    Oral  SpO2: 99% 100% 97% 98%  Weight:        Intake/Output Summary (Last 24 hours) at 09/13/2020 1130 Last data filed at 09/12/2020 1700 Gross per 24 hour  Intake 154.18 ml  Output --  Net 154.18 ml    Filed Weights   09/12/20 0416  Weight: 67.5 kg    General appearance: Awake alert.  In no distress.  Less distracted compared to yesterday. Resp: Clear to auscultation bilaterally.  Normal effort Cardio: S1-S2 is normal regular.  No S3-S4.  No rubs murmurs or bruit GI: Abdomen is soft.  Nontender nondistended.  Bowel sounds are present normal.  No masses organomegaly Extremities: No edema.   Neurologic:  No focal neurological deficits.    Lab Results:  Data Reviewed: I have personally reviewed following labs and imaging studies  CBC: Recent Labs  Lab 09/11/20 1925 09/12/20 0307  WBC 14.4* 11.1*  NEUTROABS 11.9*  --   HGB 9.6* 9.0*  HCT 31.6* 29.5*  MCV 83.6 82.9  PLT 223 206     Basic Metabolic Panel: Recent Labs  Lab 09/11/20 1925 09/12/20 0307  NA 131* 133*  K 3.9 3.7  CL 97* 99  CO2 22 24  GLUCOSE 141* 114*  BUN 27* 27*  CREATININE 1.36* 1.32*  CALCIUM 8.5* 8.5*  MG  --  1.6*  PHOS  --  4.0      GFR: Estimated Creatinine Clearance: 33.7 mL/min (A) (by C-G formula based on SCr of 1.32 mg/dL (H)).  Liver Function Tests: Recent Labs  Lab 09/11/20 1925 09/12/20 0307  AST 93* 73*  ALT 13 14  ALKPHOS 105 104  BILITOT 1.0 1.3*  PROT 5.9* 5.6*  ALBUMIN 2.6* 2.5*       Coagulation Profile: Recent Labs  Lab 09/12/20 0307  INR 2.7*       Thyroid Function Tests: Recent Labs    09/12/20 0616  TSH 2.956       Recent Results (from the past 240 hour(s))  SARS CORONAVIRUS 2 (TAT 6-24 HRS) Nasopharyngeal Nasopharyngeal Swab     Status: None   Collection Time: 09/11/20 11:23 PM   Specimen: Nasopharyngeal Swab  Result Value Ref Range Status   SARS Coronavirus 2 NEGATIVE NEGATIVE Final    Comment: (NOTE) SARS-CoV-2 target nucleic  acids are NOT DETECTED.  The SARS-CoV-2 RNA is generally detectable in upper and lower respiratory specimens during the acute phase of infection. Negative results do not preclude SARS-CoV-2 infection, do not rule out co-infections with other pathogens, and should not be used as the sole basis for treatment or other patient management decisions. Negative results must be combined with clinical observations, patient history, and epidemiological information. The expected result is Negative.  Fact Sheet for Patients: SugarRoll.be  Fact Sheet for Healthcare Providers: https://www.woods-mathews.com/  This test is not yet approved or cleared by the [place] FDA and  has been authorized for detection and/or diagnosis of SARS-CoV-2 by FDA under an Emergency Use Authorization (EUA). This EUA will remain  in effect (meaning this test can be used) for the duration of the COVID-19 declaration under Se ction 564(b)(1) of the Act, 21 U.S.C. section 360bbb-3(b)(1), unless the authorization is terminated or revoked sooner.  Performed at [facility], [place] [address] [place],  [place] [address]        Radiology Studies: CT HEAD WO CONTRAST (5MM)  Result Date: 09/11/2020 CLINICAL DATA:  Altered mental status. EXAM: CT HEAD WITHOUT CONTRAST TECHNIQUE: Contiguous axial images were obtained from the base of the skull through the vertex without intravenous contrast. COMPARISON:  Head CT dated 08/30/2020. FINDINGS: Brain: There is mild age-related atrophy and chronic microvascular ischemic changes. There is no acute intracranial hemorrhage. No mass effect or midline shift. No extra-axial fluid collection. Vascular: No hyperdense vessel or unexpected calcification. Skull: Normal. Negative for fracture or focal lesion. Sinuses/Orbits: No acute finding. Other: None IMPRESSION: 1. No acute intracranial pathology. 2. Mild age-related atrophy and chronic microvascular ischemic changes. Electronically Signed   By: Anner Crete M.D.   On: 09/11/2020 20:35   MR ANGIO HEAD WO CONTRAST  Result Date: 09/12/2020 CLINICAL DATA:  Small acute strokes on MRI brain EXAM: MRA HEAD WITHOUT CONTRAST TECHNIQUE: Angiographic images of the Circle of Willis were acquired using MRA technique without intravenous contrast. COMPARISON:  No pertinent prior exam. FINDINGS: Intracranial internal carotid arteries are patent. Middle and anterior cerebral arteries are patent. Intracranial vertebral arteries, basilar artery, posterior cerebral arteries are patent. Right posterior communicating artery is present. There is no significant stenosis or aneurysm. IMPRESSION: No proximal intracranial vessel occlusion or significant stenosis. Electronically Signed   By: Macy Mis M.D.   On: 09/12/2020 11:33   MR BRAIN WO CONTRAST  Result Date: 09/12/2020 CLINICAL DATA:  Acute neurologic deficit EXAM: MRI HEAD WITHOUT CONTRAST TECHNIQUE: Multiplanar, multiecho pulse sequences of the brain and surrounding structures were obtained without intravenous contrast. COMPARISON:  None. FINDINGS: Brain: Punctate foci of abnormal  diffusion restriction in the left frontal lobe and right parietal lobe. No acute or chronic hemorrhage. Hyperintense T2-weighted signal is moderately widespread throughout the white matter. Generalized volume loss without a clear lobar predilection. The midline structures are normal. Vascular: Major flow voids are preserved. Skull and upper cervical spine: Normal calvarium and skull base. Visualized upper cervical spine and soft tissues are normal. Sinuses/Orbits:No paranasal sinus fluid levels or advanced mucosal thickening. No mastoid or middle ear effusion. Normal orbits. IMPRESSION: 1. Punctate foci of acute ischemia in the left frontal lobe and right parietal lobe. No hemorrhage or mass effect. 2. Moderate chronic small vessel ischemic disease and generalized volume loss. Electronically Signed   By: Ulyses Jarred M.D.   On: 09/12/2020 02:04   DG Chest Portable 1 View  Result Date: 09/11/2020 CLINICAL DATA:  Chest pain  EXAM: PORTABLE CHEST 1 VIEW COMPARISON:  08/30/2020 FINDINGS: Cardiomegaly.  No focal opacity, pleural effusion or pneumothorax. IMPRESSION: No active disease.  Cardiomegaly. Electronically Signed   By: Donavan Foil M.D.   On: 09/11/2020 21:01   ECHOCARDIOGRAM COMPLETE  Result Date: 09/12/2020    ECHOCARDIOGRAM REPORT   Patient Name:   LAURISSA ANN [facility] Date of Exam: 09/12/2020 Medical Rec #:  [id number]      Height:       64.0 in Accession #:    [id number]     Weight:       148.8 lb Date of Birth:  1945-12-03      BSA:          1.725 m Patient Age:    82 years       BP:           158/73 mmHg Patient Gender: F              HR:           54 bpm. Exam Location:  Inpatient Procedure: 2D Echo, Cardiac Doppler and Color Doppler Indications:    NSTEMI  History:        Patient has prior history of Echocardiogram examinations, most                 recent 03/05/2020. Arrythmias:Atrial Fibrillation; Risk                 Factors:Hypertension, Diabetes and Dyslipidemia. GERD.  Sonographer:    Clayton Lefort  RDCS (AE) Referring Phys: [id number] OLADAPO ADEFESO IMPRESSIONS  1. Left ventricular ejection fraction by 3D volume is 47 %. The left ventricle has mild to moderately decreased function. The left ventricle demonstrates global hypokinesis. Left ventricular diastolic parameters are consistent with Grade II diastolic dysfunction (pseudonormalization). Elevated left ventricular end-diastolic pressure.  2. Right ventricular systolic function is normal. The right ventricular size is normal. There is severely elevated pulmonary artery systolic pressure. The estimated right ventricular systolic pressure is 40.8 mmHg.  3. Left atrial size was moderately dilated.  4. The mitral valve was not well visualized. Moderate to severe mitral valve regurgitation.  5. Tricuspid valve regurgitation is moderate to severe.  6. The aortic valve is tricuspid. Aortic valve regurgitation is mild to moderate. FINDINGS  Left Ventricle: Left ventricular ejection fraction by 3D volume is 47 %. The left ventricle has mild to moderately decreased function. The left ventricle demonstrates global hypokinesis. The left ventricular internal cavity size was normal in size. There is borderline left ventricular hypertrophy. Left ventricular diastolic parameters are consistent with Grade II diastolic dysfunction (pseudonormalization). Elevated left ventricular end-diastolic pressure. Right Ventricle: The right ventricular size is normal. Right vetricular wall thickness was not well visualized. Right ventricular systolic function is normal. There is severely elevated pulmonary artery systolic pressure. The tricuspid regurgitant velocity is 3.69 m/s, and with an assumed right atrial pressure of 8 mmHg, the estimated right ventricular systolic pressure is 14.4 mmHg. Left Atrium: Left atrial size was moderately dilated. Right Atrium: Right atrial size was normal in size. Pericardium: There is no evidence of pericardial effusion. Mitral Valve: The mitral valve  was not well visualized. Moderate to severe mitral valve regurgitation. Tricuspid Valve: The tricuspid valve is grossly normal. Tricuspid valve regurgitation is moderate to severe. Aortic Valve: The aortic valve is tricuspid. Aortic valve regurgitation is mild to moderate. Aortic regurgitation PHT measures 543 msec. Aortic valve mean gradient measures 5.0 mmHg. Aortic valve peak gradient measures 10.2 mmHg.  Aortic valve area, by VTI measures 0.87 cm. Pulmonic Valve: The pulmonic valve was normal in structure. Pulmonic valve regurgitation is not visualized. Aorta: The aortic root and ascending aorta are structurally normal, with no evidence of dilitation. IAS/Shunts: The atrial septum is grossly normal.  LEFT VENTRICLE PLAX 2D LVIDd:         4.80 cm         Diastology LVIDs:         3.00 cm         LV e' medial:    3.16 cm/s LV PW:         1.20 cm         LV E/e' medial:  26.0 LV IVS:        1.10 cm         LV e' lateral:   7.21 cm/s LVOT diam:     1.70 cm         LV E/e' lateral: 11.4 LV SV:         34 LV SV Index:   20 LVOT Area:     2.27 cm        3D Volume EF                                LV 3D EF:    Left                                             ventricul                                             ar                                             ejection                                             fraction                                             by 3D                                             volume is                                             47 %.                                 3D Volume EF:  3D EF:        47 %                                LV EDV:       127 ml                                LV ESV:       68 ml                                LV SV:        59 ml RIGHT VENTRICLE            IVC RV Basal diam:  3.30 cm    IVC diam: 1.90 cm RV S prime:     8.86 cm/s TAPSE (M-mode): 2.1 cm LEFT ATRIUM             Index       RIGHT ATRIUM           Index LA diam:         4.00 cm 2.32 cm/m  RA Area:     14.70 cm LA Vol (A2C):   78.9 ml 45.73 ml/m RA Volume:   34.00 ml  19.71 ml/m LA Vol (A4C):   76.4 ml 44.28 ml/m LA Biplane Vol: 78.3 ml 45.38 ml/m  AORTIC VALVE AV Area (Vmax):    0.91 cm AV Area (Vmean):   0.86 cm AV Area (VTI):     0.87 cm AV Vmax:           160.00 cm/s AV Vmean:          111.000 cm/s AV VTI:            0.396 m AV Peak Grad:      10.2 mmHg AV Mean Grad:      5.0 mmHg LVOT Vmax:         64.40 cm/s LVOT Vmean:        42.200 cm/s LVOT VTI:          0.151 m LVOT/AV VTI ratio: 0.38 AI PHT:            543 msec  AORTA Ao Root diam: 2.50 cm Ao Asc diam:  2.30 cm MITRAL VALVE                 TRICUSPID VALVE MV Area (PHT): 1.68 cm      TR Peak grad:   54.5 mmHg MV Decel Time: 451 msec      TR Vmax:        369.00 cm/s MR Peak grad:    147.4 mmHg MR Mean grad:    93.0 mmHg   SHUNTS MR Vmax:         607.00 cm/s Systemic VTI:  0.15 m MR Vmean:        457.0 cm/s  Systemic Diam: 1.70 cm MR PISA:         2.26 cm MR PISA Eff ROA: 11 mm MR PISA Radius:  0.60 cm MV E velocity: 82.30 cm/s MV A velocity: 65.80 cm/s MV E/A ratio:  1.25 Mertie Moores MD Electronically signed by Mertie Moores MD Signature Date/Time: 09/12/2020/5:04:59 PM    Final    VAS US CAROTID  Result Date: 09/12/2020 Carotid  Arterial Duplex Study Patient Name:  [facility]DA [facility]  Date of Exam:   09/12/2020 Medical Rec #: [id number]       Accession #:    [id number] Date of Birth: 1945/03/18       Patient Gender: F Patient Age:   46 years Exam [facility]  [facility] Procedure:      VAS US CAROTID Referring Phys: Alferd Patee [facility] --------------------------------------------------------------------------------  Indications:       CVA. Risk Factors:      Hypertension, hyperlipidemia, no history of smoking, prior                    MI. Other Factors:     Afib (on Eliquis), CKD,. Limitations        Today's exam was limited due to patient unable to cooperate                    with proper positioning,  tortuous vessels. Comparison Study:  No previous exams Performing Technologist: Jody Hill RVT, RDMS  Examination Guidelines: A complete evaluation includes B-mode imaging, spectral Doppler, color Doppler, and power Doppler as needed of all accessible portions of each vessel. Bilateral testing is considered an integral part of a complete examination. Limited examinations for reoccurring indications may be performed as noted.  Right Carotid Findings: +----------+--------+--------+--------+------------------+------------------+           PSV cm/sEDV cm/sStenosisPlaque DescriptionComments           +----------+--------+--------+--------+------------------+------------------+ CCA Prox  50      4                                 intimal thickening +----------+--------+--------+--------+------------------+------------------+ CCA Distal45      5                                 intimal thickening +----------+--------+--------+--------+------------------+------------------+ ICA Prox  40      8               calcific and focaltortuous           +----------+--------+--------+--------+------------------+------------------+ ICA Distal55      9                                 tortuous           +----------+--------+--------+--------+------------------+------------------+ ECA       38      0                                                    +----------+--------+--------+--------+------------------+------------------+ +----------+--------+-------+----------------+-------------------+           PSV cm/sEDV cmsDescribe        Arm Pressure (mmHG) +----------+--------+-------+----------------+-------------------+ Subclavian100            Multiphasic, WNL                    +----------+--------+-------+----------------+-------------------+ +---------+--------+--+--------+-+----------------------------+ VertebralPSV cm/s27EDV cm/s3Antegrade and High resistant  +---------+--------+--+--------+-+----------------------------+  Left Carotid Findings: +----------+--------+--------+--------+------------------+---------------------+           PSV cm/sEDV cm/sStenosisPlaque DescriptionComments              +----------+--------+--------+--------+------------------+---------------------+  CCA Prox  43      0                                                       +----------+--------+--------+--------+------------------+---------------------+ CCA Distal55      5                                 intimal thickening    +----------+--------+--------+--------+------------------+---------------------+ ICA Prox  74      13                                intimal thickening,                                                       tortuous              +----------+--------+--------+--------+------------------+---------------------+ ICA Distal86      13                                tortuous              +----------+--------+--------+--------+------------------+---------------------+ ECA       31      0                                                       +----------+--------+--------+--------+------------------+---------------------+ +----------+--------+--------+----------------+-------------------+           PSV cm/sEDV cm/sDescribe        Arm Pressure (mmHG) +----------+--------+--------+----------------+-------------------+ [id number]             Multiphasic, WNL                    +----------+--------+--------+----------------+-------------------+ +---------+--------+--+--------+-+----------------------------+ VertebralPSV cm/s25EDV cm/s0Antegrade and High resistant +---------+--------+--+--------+-+----------------------------+   Summary: Right Carotid: The extracranial vessels were near-normal with only minimal wall                thickening or plaque. Left Carotid: The extracranial vessels were near-normal with only  minimal wall               thickening or plaque. Vertebrals:  Bilateral vertebral arteries demonstrate antegrade flow. Bilateral              vertebral arteries demonstrate high resistant flow. Subclavians: Normal flow hemodynamics were seen in bilateral subclavian              arteries. *See table(s) above for measurements and observations.  Electronically signed by Deitra Mayo MD on 09/12/2020 at 3:59:56 PM.    Final        LOS: 1 day   [facility] Pager on [email or web address]  09/13/2020, 11:30 AM

## 2020-09-13 NOTE — Plan of Care (Signed)
Chart reviewed. Noted that pt had palliative care on board and plan for comfort care/hospice. Discussed with Dr. Maryland Pink, neurology will sign off and please call with further questions.   Rosalin Hawking, MD PhD Stroke Neurology 09/13/2020 8:35 AM

## 2020-09-13 NOTE — Evaluation (Signed)
Physical Therapy Evaluation and Discharge Patient Details Name: Kristen Gray MRN: [id number] DOB: Mar 19, 1945 Today's Date: 09/13/2020   History of Present Illness  This 75 y.o. female admitted from [facility] with AMS.  MRI of brain showed Punctate foci of acute ischemia in the left frontal lobe and  right parietal lobe.  She was also found to have elevated troponin and T wave changes > NSTEMI.  Also noted with acute metabolic encephalopathy in setting of dementia, CKD stage III. Palliative met with pt and family desires comfort care with Hospice.  PMH includes:  Dementia, A-Fib, HTN, Chrohn's diseases, anxiety,  Clinical Impression   Patient evaluated by Physical Therapy with no further acute PT needs identified. Noted plans for comfort care and Hospice but MD also questioning if pt could benefit from rehab prior to hospice. Patient followed commands only three times during mobility assessment and ultimately stated, "Let's not do this today" when attempting standing. Do not feel patient can participate at a level to achieve progress with her functional mobility (noted per chart it has been declining prior to recent illness). Patient will need 24/7 care, but likely not at skilled level, instead with Hospice. PT is signing off. Thank you for this referral.     Follow Up Recommendations Supervision/Assistance - 24 hour (if memory care cannot continue to care for her, Hospice at SNF or long-term care)    Equipment Recommendations  None recommended by PT    Recommendations for Other Services       Precautions / Restrictions Precautions Precautions: Fall      Mobility  Bed Mobility Overal bed mobility: Needs Assistance Bed Mobility: Rolling;Sidelying to Sit;Sit to Supine Rolling: Max assist;Total assist Sidelying to sit: Max assist;+2 for physical assistance   Sit to supine: Total assist;+2 for physical assistance   General bed mobility comments: total assist to roll  left; max to right; from rt side to sit pt assisting with RUE; no assist with return to supine    Transfers Overall transfer level: Needs assistance Equipment used: 2 person hand held assist Transfers: Sit to/from Stand Sit to Stand: Max assist;+2 physical assistance         General transfer comment: flexed posture at hips and back with inability to achieve full upright; pt then stated, "let's not do this today."  Ambulation/Gait                Stairs            Wheelchair Mobility    Modified Rankin (Stroke Patients Only)       Balance Overall balance assessment: Needs assistance Sitting-balance support: Bilateral upper extremity supported;Feet supported Sitting balance-Leahy Scale: Poor Sitting balance - Comments: tendency to lean backwards Postural control: Posterior lean Standing balance support: Bilateral upper extremity supported Standing balance-Leahy Scale: Poor                               Pertinent Vitals/Pain Pain Assessment: Faces Faces Pain Scale: No hurt    Home Living Family/patient expects to be discharged to:: Unsure                 Additional Comments: Pt was residing in ALF - memory care unit PTA; per chart she was functionally declining PTA    Prior Function Level of Independence: Needs assistance         Comments: Pt unable to provide info  Hand Dominance   Dominant Hand:  (uncertain)    Extremity/Trunk Assessment   Upper Extremity Assessment Upper Extremity Assessment: Defer to OT evaluation    Lower Extremity Assessment Lower Extremity Assessment: Generalized weakness;Difficult to assess due to impaired cognition;RLE deficits/detail;LLE deficits/detail RLE Deficits / Details: knee flexion 0-70 (sitting EOB); AAROM otherwise WFL LLE Deficits / Details: knee flexion 0-70 (sitting EOB); AAROM otherwise WFL    Cervical / Trunk Assessment Cervical / Trunk Assessment: Kyphotic  Communication    Communication: Expressive difficulties;Receptive difficulties  Cognition Arousal/Alertness: Awake/alert Behavior During Therapy: Flat affect Overall Cognitive Status: No family/caregiver present to determine baseline cognitive functioning                                 General Comments: Pt able to state her name and birthday. Later stated, "let's not do this" when working on standing      General Comments General comments (skin integrity, edema, etc.): Patient noted to have BM as rolling to prepare to get OOB. Assisted with rolling/cleaning pt prior to sit at EOB.    Exercises     Assessment/Plan    PT Assessment Patent does not need any further PT services  PT Problem List         PT Treatment Interventions      PT Goals (Current goals can be found in the Care Plan section)  Acute Rehab PT Goals Patient Stated Goal: none PT Goal Formulation: All assessment and education complete, DC therapy    Frequency     Barriers to discharge        Co-evaluation               AM-PAC PT "6 Clicks" Mobility  Outcome Measure Help needed turning from your back to your side while in a flat bed without using bedrails?: Total Help needed moving from lying on your back to sitting on the side of a flat bed without using bedrails?: Total Help needed moving to and from a bed to a chair (including a wheelchair)?: Total Help needed standing up from a chair using your arms (e.g., wheelchair or bedside chair)?: Total Help needed to walk in hospital room?: Total Help needed climbing 3-5 steps with a railing? : Total 6 Click Score: 6    End of Session Equipment Utilized During Treatment: Gait belt Activity Tolerance: Patient limited by fatigue Patient left: in bed;with call bell/phone within reach;with bed alarm set Nurse Communication: Mobility status;Other (comment) (+BM; needs new purewick and sacral dressing) PT Visit Diagnosis: Muscle weakness (generalized)  (M62.81);Difficulty in walking, not elsewhere classified (R26.2)    Time: 2500-3704 PT Time Calculation (min) (ACUTE ONLY): 30 min   Charges:   PT Evaluation $PT Eval Moderate Complexity: 1 Mod PT Treatments $Therapeutic Activity: 8-22 mins         Arby Barrette, PT Pager [phone]   Rexanne Mano 09/13/2020, 12:50 PM

## 2020-09-13 NOTE — Progress Notes (Signed)
   09/13/20 1715  Clinical Encounter Type  Visited With Patient;Health care provider  Visit Type Initial  Referral From Palliative care team   Chaplain responded to a palliative care consult. Chaplain met with the patient. She seemed a bit more alert and responsive today, but also indicated being tired. Chaplain was unable to get into much detail with her. Chaplain introduced spiritual care services. Spiritual care services available as needed.    M , Chaplain  

## 2020-09-14 DIAGNOSIS — F0390 Unspecified dementia without behavioral disturbance: Secondary | ICD-10-CM

## 2020-09-14 NOTE — Progress Notes (Signed)
Heart Failure Navigator Progress Note  Assessed for Heart & Vascular TOC clinic readiness.  Patient does not meet criteria due to plans to DC with hospice.   Navigator available for reassessment of patient.   Pricilla Holm, MSN, RN Heart Failure Nurse Navigator [phone]

## 2020-09-14 NOTE — Progress Notes (Signed)
Palliative Medicine RN Note: Sister Stanton Kidney [phone]) left a voicemail on our line asking about when the pt will d/c to [place] with hospice. Sent secure chat to [place] asking that she update Stanton Kidney.  Marjie Skiff [name], RN, BSN, [facility] Palliative Medicine Team 09/14/2020 9:10 AM Office [phone]

## 2020-09-14 NOTE — Progress Notes (Signed)
[facility] PROGRESS NOTE   Kristen Gray VVO:[id number] DOB: [birth year] DOA: 09/11/2020  PCP: Garwin Brothers, MD  Brief History/Interval Summary:  75 y.o. female with medical history significant for dementia, hypertension, hyperlipidemia Crohn's and AFib on eliquis who presented to the ED via EMS from [facility] care due to altered mental status.  Due to her dementia patient was unable to provide any information.  Patient was recently hospitalized in July due to acute metabolic encephalopathy from a urinary tract infection and was treated with IV antibiotics.  Evaluation in the ED raised concern for new acute stroke along with elevated troponin and BNP raising concern for acute coronary syndrome.     Consultants: Cardiology.  Neurology.  Palliative care  Procedures: Transthoracic echocardiogram  Antibiotics: Anti-infectives (From admission, onward)    None       Subjective/Interval History: Patient awake but remains distracted.  Does not answer any questions today.  Per nursing staff she has had very poor oral intake.  She did not eat any of her supper last night.  Did not have any of her breakfast this morning at     Assessment/Plan:   Goals of care Patient seen by palliative care.  Discussions were held with patient's sister who is her power of attorney.  Patient transition to hospice/comfort care.  Yesterday she appeared to be much more responsive however patient's oral intake has been very poor.  Did not eat any supper last night and this morning she had not eaten any breakfast this morning despite encouragement and assistance by staff.   If oral intake remains poor then this will portend a poor short-term prognosis.  May need to consider residential hospice in that case.  We will also wait for palliative care input today.  Disposition remains unclear at this time.  Non-ST elevation MI/diastolic dysfunction/pulmonary hypertension Troponins were noted to be elevated.   EKG showed significant T wave changes in several leads.  Cardiology was consulted.  Patient placed on heparin infusion.   Echocardiogram showed EF of 47% with grade 2 diastolic dysfunction.  There was also evidence for moderate to severe pulmonary hypertension. After discussions with family and palliative care patient was transition to hospice/comfort care.  She is not thought to be a good candidate for aggressive interventions and procedures due to her advanced dementia which has been progressively getting worse per family.  Heparin was discontinued.  At this time it is reasonable to continue with her flecainide and metoprolol.    Acute stroke Patient with history of atrial fibrillation on anticoagulation.  Noted to have 2 acute infarcts.  Seen by neurology.  Underwent MRA head and carotid Dopplers.  No significant carotid stenosis noted.  No significant stenosis in the intracranial vessels noted.  Since patient was transitioned to hospice further work-up was discontinued.    Acute metabolic encephalopathy in the setting of dementia According to her sister patient's dementia has been progressively worsening over the past several months.  She is also been declining from a physical activity standpoint.  Chronic kidney disease stage IIIb/hyponatremia/hypomagnesemia Monitor renal function closely.  Monitor urine output.  Replace magnesium.  Prolonged QTC Avoid QT prolonging medications.  Normocytic anemia No evidence for blood loss.  Continue to monitor.  Paroxysmal atrial fibrillation Eliquis has been discontinued now that the plan is for her more comfort strategy and hospice services.  History of Crohn's disease Seems to be stable.  Leukocytosis Appears to be reactive.     DVT Prophylaxis: Comfort care/hospice  Code Status: DNR Family Communication: Patient's sister was updated yesterday. Disposition Plan: From a memory care unit.  To be determined.  Status is: Inpatient  Remains  inpatient appropriate because:Altered mental status, Ongoing diagnostic testing needed not appropriate for outpatient work up, and Inpatient level of care appropriate due to severity of illness  Dispo: The patient is from:  Memory care unit              Anticipated d/c is to:  To be determined              Patient currently is not medically stable to d/c.   Difficult to place patient No       Medications: Scheduled:  balsalazide  1,500 mg Oral BID   feeding supplement  237 mL Oral BID BM   flecainide  75 mg Oral Q12H   metoprolol succinate  50 mg Oral Daily   Continuous:   PRN:   Objective:  Vital Signs  Vitals:   09/12/20 1645 09/12/20 1700 09/13/20 0744 09/13/20 2033  BP: (!) 159/63  (!) 187/64 (!) 159/54  Pulse: (!) 55 (!) 56 (!) 56 (!) 55  Resp: (!) 24 15 17 14   Temp:   98 F (36.7 C) 98.2 F (36.8 C)  TempSrc:   Oral Oral  SpO2: 100% 97% 98% 100%  Weight:        Intake/Output Summary (Last 24 hours) at 09/14/2020 1103 Last data filed at 09/13/2020 1747 Gross per 24 hour  Intake --  Output 350 ml  Net -350 ml    Filed Weights   09/12/20 0416  Weight: 67.5 kg    General appearance: Awake alert.  Distracted Resp: Clear to auscultation bilaterally.  Normal effort Cardio: S1-S2 is normal regular.  No S3-S4.  No rubs murmurs or bruit GI: Abdomen is soft.  Nontender nondistended.  Bowel sounds are present normal.  No masses organomegaly Extremities: No edema.   Neurologic: Disoriented.  No focal neurological deficits.    Lab Results:  Data Reviewed: I have personally reviewed following labs and imaging studies  CBC: Recent Labs  Lab 09/11/20 1925 09/12/20 0307  WBC 14.4* 11.1*  NEUTROABS 11.9*  --   HGB 9.6* 9.0*  HCT 31.6* 29.5*  MCV 83.6 82.9  PLT 223 206     Basic Metabolic Panel: Recent Labs  Lab 09/11/20 1925 09/12/20 0307  NA 131* 133*  K 3.9 3.7  CL 97* 99  CO2 22 24  GLUCOSE 141* 114*  BUN 27* 27*  CREATININE 1.36* 1.32*   CALCIUM 8.5* 8.5*  MG  --  1.6*  PHOS  --  4.0     GFR: Estimated Creatinine Clearance: 33.7 mL/min (A) (by C-G formula based on SCr of 1.32 mg/dL (H)).  Liver Function Tests: Recent Labs  Lab 09/11/20 1925 09/12/20 0307  AST 93* 73*  ALT 13 14  ALKPHOS 105 104  BILITOT 1.0 1.3*  PROT 5.9* 5.6*  ALBUMIN 2.6* 2.5*       Coagulation Profile: Recent Labs  Lab 09/12/20 0307  INR 2.7*       Thyroid Function Tests: Recent Labs    09/12/20 0616  TSH 2.956       Recent Results (from the past 240 hour(s))  SARS CORONAVIRUS 2 (TAT 6-24 HRS) Nasopharyngeal Nasopharyngeal Swab     Status: None   Collection Time: 09/11/20 11:23 PM   Specimen: Nasopharyngeal Swab  Result Value Ref Range Status   SARS Coronavirus 2 NEGATIVE NEGATIVE  Final    Comment: (NOTE) SARS-CoV-2 target nucleic acids are NOT DETECTED.  The SARS-CoV-2 RNA is generally detectable in upper and lower respiratory specimens during the acute phase of infection. Negative results do not preclude SARS-CoV-2 infection, do not rule out co-infections with other pathogens, and should not be used as the sole basis for treatment or other patient management decisions. Negative results must be combined with clinical observations, patient history, and epidemiological information. The expected result is Negative.  Fact Sheet for Patients: SugarRoll.be  Fact Sheet for Healthcare Providers: https://www.woods-mathews.com/  This test is not yet approved or cleared by the [place] FDA and  has been authorized for detection and/or diagnosis of SARS-CoV-2 by FDA under an Emergency Use Authorization (EUA). This EUA will remain  in effect (meaning this test can be used) for the duration of the COVID-19 declaration under Se ction 564(b)(1) of the Act, 21 U.S.C. section 360bbb-3(b)(1), unless the authorization is terminated or revoked sooner.  Performed at [facility], [place] [address], [place], [place] [address]        Radiology Studies: ECHOCARDIOGRAM COMPLETE  Result Date: 09/12/2020    ECHOCARDIOGRAM REPORT   Patient Name:   ALEKSIA ANN [facility] Date of Exam: 09/12/2020 Medical Rec #:  [id number]      Height:       64.0 in Accession #:    [id number]     Weight:       148.8 lb Date of Birth:  [birth year]      BSA:          1.725 m Patient Age:    65 years       BP:           158/73 mmHg Patient Gender: F              HR:           54 bpm. Exam Location:  Inpatient Procedure: 2D Echo, Cardiac Doppler and Color Doppler Indications:    NSTEMI  History:        Patient has prior history of Echocardiogram examinations, most                 recent 03/05/2020. Arrythmias:Atrial Fibrillation; Risk                 Factors:Hypertension, Diabetes and Dyslipidemia. GERD.  Sonographer:    Clayton Lefort RDCS (AE) Referring Phys: [id number] OLADAPO ADEFESO IMPRESSIONS  1. Left ventricular ejection fraction by 3D volume is 47 %. The left ventricle has mild to moderately decreased function. The left ventricle demonstrates global hypokinesis. Left ventricular diastolic parameters are consistent with Grade II diastolic dysfunction (pseudonormalization). Elevated left ventricular end-diastolic pressure.  2. Right ventricular systolic function is normal. The right ventricular size is normal. There is severely elevated pulmonary artery systolic pressure. The estimated right ventricular systolic pressure is 28.4 mmHg.  3. Left atrial size was moderately dilated.  4. The mitral valve was not well visualized. Moderate to severe mitral valve regurgitation.  5. Tricuspid valve regurgitation is moderate to severe.  6. The aortic valve is tricuspid. Aortic valve regurgitation is mild to moderate. FINDINGS  Left Ventricle: Left ventricular ejection fraction by 3D volume is 47 %. The left ventricle has mild to moderately decreased function. The left ventricle demonstrates global hypokinesis. The left  ventricular internal cavity size was normal in size. There is borderline left ventricular hypertrophy. Left ventricular diastolic parameters are consistent with Grade II diastolic dysfunction (pseudonormalization). Elevated  left ventricular end-diastolic pressure. Right Ventricle: The right ventricular size is normal. Right vetricular wall thickness was not well visualized. Right ventricular systolic function is normal. There is severely elevated pulmonary artery systolic pressure. The tricuspid regurgitant velocity is 3.69 m/s, and with an assumed right atrial pressure of 8 mmHg, the estimated right ventricular systolic pressure is 69.4 mmHg. Left Atrium: Left atrial size was moderately dilated. Right Atrium: Right atrial size was normal in size. Pericardium: There is no evidence of pericardial effusion. Mitral Valve: The mitral valve was not well visualized. Moderate to severe mitral valve regurgitation. Tricuspid Valve: The tricuspid valve is grossly normal. Tricuspid valve regurgitation is moderate to severe. Aortic Valve: The aortic valve is tricuspid. Aortic valve regurgitation is mild to moderate. Aortic regurgitation PHT measures 543 msec. Aortic valve mean gradient measures 5.0 mmHg. Aortic valve peak gradient measures 10.2 mmHg. Aortic valve area, by VTI measures 0.87 cm. Pulmonic Valve: The pulmonic valve was normal in structure. Pulmonic valve regurgitation is not visualized. Aorta: The aortic root and ascending aorta are structurally normal, with no evidence of dilitation. IAS/Shunts: The atrial septum is grossly normal.  LEFT VENTRICLE PLAX 2D LVIDd:         4.80 cm         Diastology LVIDs:         3.00 cm         LV e' medial:    3.16 cm/s LV PW:         1.20 cm         LV E/e' medial:  26.0 LV IVS:        1.10 cm         LV e' lateral:   7.21 cm/s LVOT diam:     1.70 cm         LV E/e' lateral: 11.4 LV SV:         34 LV SV Index:   20 LVOT Area:     2.27 cm        3D Volume EF                                 LV 3D EF:    Left                                             ventricul                                             ar                                             ejection                                             fraction  by 3D                                             volume is                                             47 %.                                 3D Volume EF:                                3D EF:        47 %                                LV EDV:       127 ml                                LV ESV:       68 ml                                LV SV:        59 ml RIGHT VENTRICLE            IVC RV Basal diam:  3.30 cm    IVC diam: 1.90 cm RV S prime:     8.86 cm/s TAPSE (M-mode): 2.1 cm LEFT ATRIUM             Index       RIGHT ATRIUM           Index LA diam:        4.00 cm 2.32 cm/m  RA Area:     14.70 cm LA Vol (A2C):   78.9 ml 45.73 ml/m RA Volume:   34.00 ml  19.71 ml/m LA Vol (A4C):   76.4 ml 44.28 ml/m LA Biplane Vol: 78.3 ml 45.38 ml/m  AORTIC VALVE AV Area (Vmax):    0.91 cm AV Area (Vmean):   0.86 cm AV Area (VTI):     0.87 cm AV Vmax:           160.00 cm/s AV Vmean:          111.000 cm/s AV VTI:            0.396 m AV Peak Grad:      10.2 mmHg AV Mean Grad:      5.0 mmHg LVOT Vmax:         64.40 cm/s LVOT Vmean:        42.200 cm/s LVOT VTI:          0.151 m LVOT/AV VTI ratio: 0.38 AI PHT:            543 msec  AORTA Ao Root diam: 2.50 cm Ao Asc diam:  2.30 cm MITRAL VALVE  TRICUSPID VALVE MV Area (PHT): 1.68 cm      TR Peak grad:   54.5 mmHg MV Decel Time: 451 msec      TR Vmax:        369.00 cm/s MR Peak grad:    147.4 mmHg MR Mean grad:    93.0 mmHg   SHUNTS MR Vmax:         607.00 cm/s Systemic VTI:  0.15 m MR Vmean:        457.0 cm/s  Systemic Diam: 1.70 cm MR PISA:         2.26 cm MR PISA Eff ROA: 11 mm MR PISA Radius:  0.60 cm MV E velocity: 82.30 cm/s MV A velocity: 65.80 cm/s MV E/A ratio:  1.25 Mertie Moores MD Electronically signed by Mertie Moores MD Signature Date/Time: 09/12/2020/5:04:59 PM    Final        LOS: 2 days   [facility] Pager on [email or web address]  09/14/2020, 11:03 AM

## 2020-09-14 NOTE — Progress Notes (Signed)
Palliative Medicine RN Note: Rec'd call from pt's sister Kristen Gray. She wants pt to go back to [place], as the pt is familiar with the surroundings and will be around things she knows and loves. I let Kristen Gray know that they are coming eval her to ensure they can meet her needs.  If we can't reach [facility] by phone, please email her at [email or web address]  Kaye Luoma G. Bibiana Gillean, RN, BSN, [facility] Palliative Medicine Team 09/14/2020 12:22 PM Office [phone]

## 2020-09-14 NOTE — Progress Notes (Signed)
Nutrition Brief Note  Chart reviewed.  Pt now transitioning to comfort care/hospice.   No further nutrition interventions planned at this time.   Please re-consult as needed.   Derrel Nip, RD, LDN (she/her/hers) Registered Dietitian I After-Hours/Weekend Pager # in [place]

## 2020-09-14 NOTE — TOC Progression Note (Addendum)
Transition of Care [facility]) - Progression Note    Patient Details  Name: Kristen Gray MRN: [id number] Date of Birth: [birth year]  Transition of Care [facility]) CM/SW [place] [place] Phone Number: 09/14/2020, 11:59 AM  Clinical Narrative:     Update- CSW received call from [place] with [facility] who confirmed they would not be able to accept patient back with hospice, as they confirmed they cannot meet patents needs. CSW informed palliative and MD. CSW updated patients sister Stanton Kidney. Palliative confirmed with CSW they will follow up with patients sister Stanton Kidney in the morning.  Update- CSW spoke with [facility] they requested clinicals on patient to review. CSW faxed over requested clinicals for review. CSW awaiting callback to see if patient can return to ALF with hospice. CSW will continue to follow.  CSW spoke with patients sister Stanton Kidney who confirmed that she wants patient to go back to her ALF [facility] with Hospice. CSW spoke with [facility] and let them know of patients current plan for patient to return to ALF with hospice .ALF confirmed they will come assess patient if plan is for patient to return with hospice. CSW awaiting to hear back from ALF once patient is assessed. CSW informed MD.  Expected Discharge Plan: Memory Care Barriers to Discharge: Continued Medical Work up  Expected Discharge Plan and Services Expected Discharge Plan: [place] arrangements for the past 2 months: [place]                                       Social Determinants of Health (SDOH) Interventions    Readmission Risk Interventions No flowsheet data found.

## 2020-09-15 MED ORDER — LORAZEPAM 0.5 MG PO TABS
0.5000 mg | ORAL_TABLET | ORAL | Status: AC | PRN
  Administered 2020-09-15 – 2020-09-16 (×2): 0.5 mg via ORAL
  Filled 2020-09-15 (×2): qty 1

## 2020-09-15 MED ORDER — ENSURE ENLIVE PO LIQD: 237.0000 mL | Freq: Two times a day (BID) | ORAL | 12 refills | Status: AC

## 2020-09-15 NOTE — Progress Notes (Signed)
Patient crying and restless. Paged oncall MD and received order for prn Ativan

## 2020-09-15 NOTE — Progress Notes (Signed)
[facility] night shift MedSurg coverage note.  The nursing staff reported that the patient is very anxious and restless.  She is off of antidepressants.  She is supposed to go home on lorazepam 0.5 mg twice daily.  I am going to add 0.5 mg every 4 hours as needed x2 doses for tonight.  Tennis Must, MD.

## 2020-09-15 NOTE — Progress Notes (Signed)
[place] [id number] [facility]) Hospital Liaison Note  Received request from [facility], [name] for family interest in [facility]. Chart reviewed and eligibility is confirmed at this time. Spoke with patient's sister Stanton Kidney to acknowledge referral and explain services.   Unfortunately [facility] does not have a bed to offer today. TOC is aware hospital liaison will follow up tomorrow or sooner if room becomes available.   Please do not hesitate to call with any hospice related questions or concerns.   Thank you for the opportunity to participate in this patient's care.   Jhonnie Garner, Therapist, sports, [facility] Liaison  [phone]

## 2020-09-15 NOTE — Progress Notes (Signed)
Patient is confused. Unable to complete Screening.

## 2020-09-15 NOTE — TOC Progression Note (Addendum)
Transition of Care [facility]) - Progression Note    Patient Details  Name: Klaryssa Fauth MRN: [id number] Date of Birth: 1945-07-23  Transition of Care [facility]) CM/SW [place], [place] Phone Number: 09/15/2020, 3:26 PM  Clinical Narrative:     CSW received consult for Residential hospice referral to [facility] place for patient. CSW called [facility] and [facility] with [facility] and made referral for [facility]. Misty with [facility] confirmed no bed availability today.CSW will continue to follow and assist with dc planning needs.  Expected Discharge Plan: Memory Care Barriers to Discharge: Continued Medical Work up  Expected Discharge Plan and Services Expected Discharge Plan: Memory Care       Living arrangements for the past 2 months: [place] Expected Discharge Date: 09/15/20                                     Social Determinants of Health (SDOH) Interventions    Readmission Risk Interventions No flowsheet data found.

## 2020-09-15 NOTE — Progress Notes (Addendum)
Palliative Medicine RN Note: Symptom check.   Visited Kristen [name], who was asleep until I walked in. She was able to answer "No" when asked if she was in pain or having trouble breathing.  I noted open applesauce at the bedside with none obviously missing. Her RN Oletta Darter reports that, while Kristen [name] had an ok appetite yesterday, this has dramatically changed today. They report that she at none of her breakfast and wont take sips or bites for them.   This decline certainly brings her prognosis to less than 2 weeks. Upon discussion with PMT NP Elie Confer, it is agreed that her prognosis is now short enough to qualify for residential hospice, just days to a week or so.  I attempted to call her sister Kristen [name] [phone]) but got voicemail. If she agrees to a residential hospice referral, that would be the location where she would receive the most appropriate care. I left a message for [facility] with my contact information.  Kristen Skiff [name], RN, BSN, [facility] Palliative Medicine Team 09/15/2020 11:04 AM Office [phone]   ADDENDUM  Spoke with sister Kristen [name]. She agreed to referral to residential hospice, but she will only consider referral to [facility] (offered choice; pt has friends who would be visiting, and [facility] is best option for them). I have concerns that she doesn't accept that pt is not eating as a function of dying, as she insists pt will eat if we tell her to "perk up" and give her pasta dishes. I updated SW [name] and [name].  Kristen Skiff [name], RN, BSN, [facility] Palliative Medicine Team 09/15/2020 11:48 AM Office [phone]

## 2020-09-15 NOTE — Discharge Summary (Signed)
[facility]  Physician Discharge Summary   Patient ID: Kristen [name] MRN: [id number] DOB/AGE: 1946-01-12 75 y.o.  Admit date: 09/11/2020 Discharge date:   09/15/2020   PCP: Garwin Brothers, MD  DISCHARGE DIAGNOSES:  Non-ST elevation MI Pulmonary hypertension Acute stroke, embolic Acute metabolic encephalopathy Advanced dementia Chronic kidney disease stage IIIb Paroxysmal atrial fibrillation History of Crohn's disease    RECOMMENDATIONS FOR OUTPATIENT FOLLOW UP: Patient to go to residential hospice for end-of-life care   CODE STATUS: DNR  DISCHARGE CONDITION: stable  Diet recommendation: Comfort feeds as tolerated  INITIAL HISTORY: 75 y.o. female with medical history significant for dementia, hypertension, hyperlipidemia Crohn's and AFib on eliquis who presented to the ED via EMS from [facility] memory [facility] due to altered mental status.  Due to her dementia patient was unable to provide any information.  Patient was recently hospitalized in July due to acute metabolic encephalopathy from a urinary tract infection and was treated with IV antibiotics.  Evaluation in the ED raised concern for new acute stroke along with elevated troponin and BNP raising concern for acute coronary syndrome.     Consultants: Cardiology.  Neurology.  Palliative care   Procedures: Transthoracic echocardiogram   [name] COURSE:   Goals of care Patient seen by palliative care.  Discussions were held with patient's sister who is her power of attorney.  Patient was transitioned to hospice/comfort care.  Patient with a significantly poor oral intake.  Not eating or drinking anything at all despite encouragement and assistance by staff.  This portends a very poor prognosis.  Initially plan was for the patient to go back to her memory care unit.  However they are unable to take care of for her her needs.  Residential hospice would be an appropriate setting for this patient.  Palliative care is  following.  Referral has been sent for residential hospice for    Non-ST elevation MI/diastolic dysfunction/pulmonary hypertension Troponins were noted to be elevated.  EKG showed significant T wave changes in several leads.  Cardiology was consulted.  Patient placed on heparin infusion.   Echocardiogram showed EF of 47% with grade 2 diastolic dysfunction.  There was also evidence for moderate to severe pulmonary hypertension. After discussions with family and palliative care patient was transition to hospice/comfort care.  She is not thought to be a good candidate for aggressive interventions and procedures due to her advanced dementia which has been progressively getting worse per family.     Acute stroke Patient with history of atrial fibrillation on anticoagulation.  Noted to have 2 acute infarcts.  Seen by neurology.  Underwent MRA head and carotid Dopplers.  No significant carotid stenosis noted.  No significant stenosis in the intracranial vessels noted.  Since patient was transitioned to hospice further work-up was discontinued.  Eliquis has been discontinued.   Acute metabolic encephalopathy/advanced dementia According to her sister patient's dementia has been progressively worsening over the past several months.  She is also been declining from a physical activity standpoint.   Chronic kidney disease stage IIIb/hyponatremia/hypomagnesemia Prolonged QTC Normocytic anemia  Paroxysmal atrial fibrillation Eliquis has been discontinued now that the plan is for her more comfort strategy and hospice services.   Pressure Injury 09/12/20 Foot Left Unstageable - Full thickness tissue loss in which the base of the injury is covered by slough (yellow, tan, gray, green or brown) and/or eschar (tan, brown or black) in the wound bed. Black circular wound with redness s (Active)  09/12/20 0730  Location: Foot  Location Orientation: Left  Staging: Unstageable - Full thickness tissue loss in which  the base of the injury is covered by slough (yellow, tan, gray, green or brown) and/or eschar (tan, brown or black) in the wound bed.  Wound Description (Comments): Black circular wound with redness surrounding it and top of foot  Present on Admission: Yes    Okay for discharge to residential hospice whenever bed is available.   PERTINENT LABS:  The results of significant diagnostics from this hospitalization (including imaging, microbiology, ancillary and laboratory) are listed below for reference.    Microbiology: Recent Results (from the past 240 hour(s))  SARS CORONAVIRUS 2 (TAT 6-24 HRS) Nasopharyngeal Nasopharyngeal Swab     Status: None   Collection Time: 09/11/20 11:23 PM   Specimen: Nasopharyngeal Swab  Result Value Ref Range Status   SARS Coronavirus 2 NEGATIVE NEGATIVE Final    Comment: (NOTE) SARS-CoV-2 target nucleic acids are NOT DETECTED.  The SARS-CoV-2 RNA is generally detectable in upper and lower respiratory specimens during the acute phase of infection. Negative results do not preclude SARS-CoV-2 infection, do not rule out co-infections with other pathogens, and should not be used as the sole basis for treatment or other patient management decisions. Negative results must be combined with clinical observations, patient history, and epidemiological information. The expected result is Negative.  Fact Sheet for Patients: SugarRoll.be  Fact Sheet for Healthcare Providers: https://www.woods-mathews.com/  This test is not yet approved or cleared by the [place] FDA and  has been authorized for detection and/or diagnosis of SARS-CoV-2 by FDA under an Emergency Use Authorization (EUA). This EUA will remain  in effect (meaning this test can be used) for the duration of the COVID-19 declaration under Se ction 564(b)(1) of the Act, 21 U.S.C. section 360bbb-3(b)(1), unless the authorization is terminated or revoked  sooner.  Performed at [name], [place] [address] [place], [place] [address]      Labs:  COVID-19 Labs   Lab Results  Component Value Date   [place] 09/11/2020   [place] NEGATIVE 08/03/2020   [place] NEGATIVE 08/01/2020   [place] NEGATIVE 03/04/2020      Basic Metabolic Panel: Recent Labs  Lab 09/11/20 1925 09/12/20 0307  NA 131* 133*  K 3.9 3.7  CL 97* 99  CO2 22 24  GLUCOSE 141* 114*  BUN 27* 27*  CREATININE 1.36* 1.32*  CALCIUM 8.5* 8.5*  MG  --  1.6*  PHOS  --  4.0   Liver Function Tests: Recent Labs  Lab 09/11/20 1925 09/12/20 0307  AST 93* 73*  ALT 13 14  ALKPHOS 105 104  BILITOT 1.0 1.3*  PROT 5.9* 5.6*  ALBUMIN 2.6* 2.5*    CBC: Recent Labs  Lab 09/11/20 1925 09/12/20 0307  WBC 14.4* 11.1*  NEUTROABS 11.9*  --   HGB 9.6* 9.0*  HCT 31.6* 29.5*  MCV 83.6 82.9  PLT 223 206    BNP: BNP (last 3 results) Recent Labs    03/04/20 1801 09/11/20 0307  BNP 877.8* 1,836.5*     IMAGING STUDIES DG Shoulder Right  Result Date: 08/26/2020 CLINICAL DATA:  Fall EXAM: RIGHT SHOULDER - 2+ VIEW COMPARISON:  None. FINDINGS: Hook shaped osteophyte of the medial right humeral head. No acute fracture or dislocation. IMPRESSION: No acute fracture or dislocation of the right shoulder. Electronically Signed   By: Ulyses Jarred M.D.   On: 08/26/2020 03:06   CT HEAD WO CONTRAST (5MM)  Result Date: 09/11/2020 CLINICAL DATA:  Altered mental status. EXAM: CT HEAD WITHOUT CONTRAST TECHNIQUE: Contiguous axial images were obtained from the base of the skull through the vertex without intravenous contrast. COMPARISON:  Head CT dated 08/30/2020. FINDINGS: Brain: There is mild age-related atrophy and chronic microvascular ischemic changes. There is no acute intracranial hemorrhage. No mass effect or midline shift. No extra-axial fluid collection. Vascular: No hyperdense vessel or unexpected calcification. Skull: Normal. Negative for  fracture or focal lesion. Sinuses/Orbits: No acute finding. Other: None IMPRESSION: 1. No acute intracranial pathology. 2. Mild age-related atrophy and chronic microvascular ischemic changes. Electronically Signed   By: Anner Crete M.D.   On: 09/11/2020 20:35   CT Head Wo Contrast  Result Date: 08/30/2020 CLINICAL DATA:  Unwitnessed fall, blood thinners EXAM: CT HEAD WITHOUT CONTRAST CT CERVICAL SPINE WITHOUT CONTRAST TECHNIQUE: Multidetector CT imaging of the head and cervical spine was performed following the standard protocol without intravenous contrast. Multiplanar CT image reconstructions of the cervical spine were also generated. COMPARISON:  04/21/2020 FINDINGS: CT HEAD FINDINGS Brain: No evidence of acute infarction, hemorrhage, hydrocephalus, extra-axial collection or mass lesion/mass effect. Periventricular and deep white matter hypodensity. Vascular: No hyperdense vessel or unexpected calcification. Skull: Normal. Negative for fracture or focal lesion. Sinuses/Orbits: No acute finding. Other: None. CT CERVICAL SPINE FINDINGS Alignment: Normal. Skull base and vertebrae: No acute fracture. No primary bone lesion or focal pathologic process. Soft tissues and spinal canal: No prevertebral fluid or swelling. No visible canal hematoma. Disc levels: Moderate disc space height loss and osteophytosis of C5-C7. Disc spaces are otherwise relatively preserved. Upper chest: Negative. Other: None. IMPRESSION: 1. No acute intracranial pathology. Small-vessel white matter disease. 2. No fracture or static subluxation of the cervical spine. 3. Moderate disc space height loss and osteophytosis of C5-C7. Electronically Signed   By: Eddie Candle M.D.   On: 08/30/2020 10:32   CT HEAD WO CONTRAST  Result Date: 08/26/2020 CLINICAL DATA:  Polytrauma, critical, head/C-spine injury suspected; Facial trauma. Chronic anticoagulation. EXAM: CT HEAD WITHOUT CONTRAST CT MAXILLOFACIAL WITHOUT CONTRAST CT CERVICAL SPINE  WITHOUT CONTRAST TECHNIQUE: Multidetector CT imaging of the head, cervical spine, and maxillofacial structures were performed using the standard protocol without intravenous contrast. Multiplanar CT image reconstructions of the cervical spine and maxillofacial structures were also generated. COMPARISON:  None. FINDINGS: CT HEAD FINDINGS Brain: Normal anatomic configuration. Parenchymal volume loss is commensurate with the patient's age. Mild periventricular white matter changes are present likely reflecting the sequela of small vessel ischemia. 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 No abnormal mass effect or midline shift. No evidence of acute intracranial hemorrhage or infarct. Ventricular size is normal. Cerebellum unremarkable. Vascular: No asymmetric hyperdense vasculature at the skull base. Skull: Intact Other: Mastoid air cells and middle ear cavities are clear. CT MAXILLOFACIAL FINDINGS Osseous: No fracture or mandibular dislocation. No destructive process. Orbits: Negative. No traumatic or inflammatory finding. Sinuses: Clear. Soft tissues: Negative. CT CERVICAL SPINE FINDINGS Alignment: 1-2 mm anterolisthesis of C4 upon C5 is likely degenerative in nature. Normal cervical lordosis. Skull base and vertebrae: The craniocervical alignment is normal. Atlantal dental interval is not widened. No acute fracture of the cervical spine. Vertebral body height has been preserved. Soft tissues and spinal canal: Moderate central posterior disc herniation at C3-4 abuts the thecal sac without remodeling. Milder posterior disc herniation noted at C2-3. Posterior disc osteophyte complex at C5-6 at C6-7 mildly narrows the spinal canal with mild flattening of the  thecal sac. The AP diameter of the spinal canal in this region is, at minimum 6-7 mm in AP diameter. No canal hematoma. The prevertebral soft tissues are not thickened. Nose  paraspinal fluid collection identified. Disc levels: There is intervertebral disc space narrowing and endplate remodeling at N3-I1 in keeping with changes of moderate to severe degenerative disc disease. Degenerative changes are noted at C3-4 and C4-5 with disc osteophytes and posterior disc herniations, as described above. The prevertebral soft tissues are not thickened on sagittal reformats. Review of the axial images demonstrates multilevel uncovertebral arthrosis resulting in moderate bilateral neuroforaminal narrowing on the left at C5-6 and bilaterally at C6-7. Milder neuroforaminal narrowing is noted bilaterally at C4-5 and on the right at C5-6. Mild neuroforaminal narrowing is also noted on the right at C3-4. Upper chest: Unremarkable Other: None IMPRESSION: No acute intracranial abnormality.  No calvarial fracture. No acute facial fracture. No acute fracture or listhesis of the cervical spine. Electronically Signed   By: Fidela Salisbury MD   On: 08/26/2020 03:02   CT Cervical Spine Wo Contrast  Result Date: 08/30/2020 CLINICAL DATA:  Unwitnessed fall, blood thinners EXAM: CT HEAD WITHOUT CONTRAST CT CERVICAL SPINE WITHOUT CONTRAST TECHNIQUE: Multidetector CT imaging of the head and cervical spine was performed following the standard protocol without intravenous contrast. Multiplanar CT image reconstructions of the cervical spine were also generated. COMPARISON:  04/21/2020 FINDINGS: CT HEAD FINDINGS Brain: No evidence of acute infarction, hemorrhage, hydrocephalus, extra-axial collection or mass lesion/mass effect. Periventricular and deep white matter hypodensity. Vascular: No hyperdense vessel or unexpected calcification. Skull: Normal. Negative for fracture or focal lesion. Sinuses/Orbits: No acute finding. Other: None. CT CERVICAL SPINE FINDINGS Alignment: Normal. Skull base and vertebrae: No acute fracture. No primary bone lesion or focal pathologic process. Soft tissues and spinal canal: No  prevertebral fluid or swelling. No visible canal hematoma. Disc levels: Moderate disc space height loss and osteophytosis of C5-C7. Disc spaces are otherwise relatively preserved. Upper chest: Negative. Other: None. IMPRESSION: 1. No acute intracranial pathology. Small-vessel white matter disease. 2. No fracture or static subluxation of the cervical spine. 3. Moderate disc space height loss and osteophytosis of C5-C7. Electronically Signed   By: Eddie Candle M.D.   On: 08/30/2020 10:32   CT CERVICAL SPINE WO CONTRAST  Result Date: 08/26/2020 CLINICAL DATA:  Polytrauma, critical, head/C-spine injury suspected; Facial trauma. Chronic anticoagulation. EXAM: CT HEAD WITHOUT CONTRAST CT MAXILLOFACIAL WITHOUT CONTRAST CT CERVICAL SPINE WITHOUT CONTRAST TECHNIQUE: Multidetector CT imaging of the head, cervical spine, and maxillofacial structures were performed using the standard protocol without intravenous contrast. Multiplanar CT image reconstructions of the cervical spine and maxillofacial structures were also generated. COMPARISON:  None. FINDINGS: CT HEAD FINDINGS Brain: Normal anatomic configuration. Parenchymal volume loss is commensurate with the patient's age. Mild periventricular white matter changes are present likely reflecting the sequela of small vessel ischemia. 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 No abnormal mass effect or midline shift. No evidence of acute intracranial hemorrhage or infarct. Ventricular size is normal. Cerebellum unremarkable. Vascular: No asymmetric hyperdense vasculature at the skull base. Skull: Intact Other: Mastoid air cells and middle ear cavities are clear. CT MAXILLOFACIAL FINDINGS Osseous: No fracture or mandibular dislocation. No destructive process. Orbits: Negative. No traumatic or inflammatory finding. Sinuses: Clear. Soft tissues: Negative. CT CERVICAL SPINE FINDINGS Alignment: 1-2  mm anterolisthesis of C4 upon C5 is likely degenerative in nature. Normal cervical lordosis. Skull  base and vertebrae: The craniocervical alignment is normal. Atlantal dental interval is not widened. No acute fracture of the cervical spine. Vertebral body height has been preserved. Soft tissues and spinal canal: Moderate central posterior disc herniation at C3-4 abuts the thecal sac without remodeling. Milder posterior disc herniation noted at C2-3. Posterior disc osteophyte complex at C5-6 at C6-7 mildly narrows the spinal canal with mild flattening of the thecal sac. The AP diameter of the spinal canal in this region is, at minimum 6-7 mm in AP diameter. No canal hematoma. The prevertebral soft tissues are not thickened. Nose paraspinal fluid collection identified. Disc levels: There is intervertebral disc space narrowing and endplate remodeling at E9-B2 in keeping with changes of moderate to severe degenerative disc disease. Degenerative changes are noted at C3-4 and C4-5 with disc osteophytes and posterior disc herniations, as described above. The prevertebral soft tissues are not thickened on sagittal reformats. Review of the axial images demonstrates multilevel uncovertebral arthrosis resulting in moderate bilateral neuroforaminal narrowing on the left at C5-6 and bilaterally at C6-7. Milder neuroforaminal narrowing is noted bilaterally at C4-5 and on the right at C5-6. Mild neuroforaminal narrowing is also noted on the right at C3-4. Upper chest: Unremarkable Other: None IMPRESSION: No acute intracranial abnormality.  No calvarial fracture. No acute facial fracture. No acute fracture or listhesis of the cervical spine. Electronically Signed   By: Fidela Salisbury MD   On: 08/26/2020 03:02   MR ANGIO HEAD WO CONTRAST  Result Date: 09/12/2020 CLINICAL DATA:  Small acute strokes on MRI brain EXAM: MRA HEAD WITHOUT CONTRAST TECHNIQUE: Angiographic images of the Circle of Willis were acquired using MRA technique  without intravenous contrast. COMPARISON:  No pertinent prior exam. FINDINGS: Intracranial internal carotid arteries are patent. Middle and anterior cerebral arteries are patent. Intracranial vertebral arteries, basilar artery, posterior cerebral arteries are patent. Right posterior communicating artery is present. There is no significant stenosis or aneurysm. IMPRESSION: No proximal intracranial vessel occlusion or significant stenosis. Electronically Signed   By: Macy Mis M.D.   On: 09/12/2020 11:33   MR BRAIN WO CONTRAST  Result Date: 09/12/2020 CLINICAL DATA:  Acute neurologic deficit EXAM: MRI HEAD WITHOUT CONTRAST TECHNIQUE: Multiplanar, multiecho pulse sequences of the brain and surrounding structures were obtained without intravenous contrast. COMPARISON:  None. FINDINGS: Brain: Punctate foci of abnormal diffusion restriction in the left frontal lobe and right parietal lobe. No acute or chronic hemorrhage. Hyperintense T2-weighted signal is moderately widespread throughout the white matter. Generalized volume loss without a clear lobar predilection. The midline structures are normal. Vascular: Major flow voids are preserved. Skull and upper cervical spine: Normal calvarium and skull base. Visualized upper cervical spine and soft tissues are normal. Sinuses/Orbits:No paranasal sinus fluid levels or advanced mucosal thickening. No mastoid or middle ear effusion. Normal orbits. IMPRESSION: 1. Punctate foci of acute ischemia in the left frontal lobe and right parietal lobe. No hemorrhage or mass effect. 2. Moderate chronic small vessel ischemic disease and generalized volume loss. Electronically Signed   By: Ulyses Jarred M.D.   On: 09/12/2020 02:04   DG Pelvis Portable  Result Date: 08/30/2020 CLINICAL DATA:  fall EXAM: PORTABLE PELVIS 1-2 VIEWS COMPARISON:  09/12/2016 (without report. FINDINGS: There is no evidence of pelvic fracture or diastasis. No hip dislocation. Severe right and  mild-to-moderate left hip degenerative change. Pelvic enthesopathy. Lower lumbar degenerative change. Rounded calcification in the imaged left abdomen. Midline sutures. IMPRESSION: 1. No evidence of acute fracture. 2. Severe right and mild  to moderate left hip degenerative change. 3. Rounded calcification in the imaged left abdomen, nonspecific but potentially a renal calculus versus hyperdense material in the fecal stream. Electronically Signed   By: Margaretha Sheffield MD   On: 08/30/2020 10:07   DG Pelvis Portable  Result Date: 08/26/2020 CLINICAL DATA:  Unwitnessed fall EXAM: PORTABLE PELVIS 1-2 VIEWS COMPARISON:  None. FINDINGS: Normal alignment. No fracture or dislocation. Severe right hip degenerative arthritis. Mild left hip degenerative arthritis. Soft tissues are unremarkable save for laparotomy suture. IMPRESSION: No acute fracture or dislocation. Electronically Signed   By: Fidela Salisbury MD   On: 08/26/2020 02:29   DG Chest Portable 1 View  Result Date: 09/11/2020 CLINICAL DATA:  Chest pain EXAM: PORTABLE CHEST 1 VIEW COMPARISON:  08/30/2020 FINDINGS: Cardiomegaly.  No focal opacity, pleural effusion or pneumothorax. IMPRESSION: No active disease.  Cardiomegaly. Electronically Signed   By: Donavan Foil M.D.   On: 09/11/2020 21:01   DG Chest Portable 1 View  Result Date: 08/30/2020 CLINICAL DATA:  Fall, blood thinners EXAM: PORTABLE CHEST 1 VIEW COMPARISON:  03/18/2020 FINDINGS: Cardiomegaly and prominence of the pulmonary vasculature. Both lungs are clear. The visualized skeletal structures are unremarkable. IMPRESSION: Cardiomegaly without acute abnormality of the lungs in AP portable projection. Electronically Signed   By: Eddie Candle M.D.   On: 08/30/2020 10:03   DG Chest Port 1 View  Result Date: 08/26/2020 CLINICAL DATA:  Unwitnessed fall, right shoulder pain EXAM: PORTABLE CHEST 1 VIEW COMPARISON:  None FINDINGS: The lungs are symmetrically expanded. Trace bilateral perihilar  interstitial pulmonary infiltrate most in keeping with perihilar pulmonary edema. Cardiac size mildly enlarged. Small right pleural effusion. No pneumothorax. No acute bone abnormality. IMPRESSION: Mild cardiomegaly. Trace perihilar pulmonary edema and small right pleural effusion, possibly cardiogenic in nature. Electronically Signed   By: Fidela Salisbury MD   On: 08/26/2020 02:29   ECHOCARDIOGRAM COMPLETE  Result Date: 09/12/2020    ECHOCARDIOGRAM REPORT   Patient Name:   Kristen [name] Date of Exam: 09/12/2020 Medical Rec #:  [id number]      Height:       64.0 in Accession #:    [id number]     Weight:       148.8 lb Date of Birth:  08/19/1945      BSA:          1.725 m Patient Age:    64 years       BP:           158/73 mmHg Patient Gender: F              HR:           54 bpm. Exam Location:  Inpatient Procedure: 2D Echo, Cardiac Doppler and Color Doppler Indications:    NSTEMI  History:        Patient has prior history of Echocardiogram examinations, most                 recent 03/05/2020. Arrythmias:Atrial Fibrillation; Risk                 Factors:Hypertension, Diabetes and Dyslipidemia. GERD.  Sonographer:    Clayton Lefort RDCS (AE) Referring Phys: [id number] OLADAPO ADEFESO IMPRESSIONS  1. Left ventricular ejection fraction by 3D volume is 47 %. The left ventricle has mild to moderately decreased function. The left ventricle demonstrates global hypokinesis. Left ventricular diastolic parameters are consistent with Grade II diastolic dysfunction (pseudonormalization). Elevated left ventricular end-diastolic pressure.  2.  Right ventricular systolic function is normal. The right ventricular size is normal. There is severely elevated pulmonary artery systolic pressure. The estimated right ventricular systolic pressure is 24.8 mmHg.  3. Left atrial size was moderately dilated.  4. The mitral valve was not well visualized. Moderate to severe mitral valve regurgitation.  5. Tricuspid valve regurgitation is moderate to  severe.  6. The aortic valve is tricuspid. Aortic valve regurgitation is mild to moderate. FINDINGS  Left Ventricle: Left ventricular ejection fraction by 3D volume is 47 %. The left ventricle has mild to moderately decreased function. The left ventricle demonstrates global hypokinesis. The left ventricular internal cavity size was normal in size. There is borderline left ventricular hypertrophy. Left ventricular diastolic parameters are consistent with Grade II diastolic dysfunction (pseudonormalization). Elevated left ventricular end-diastolic pressure. Right Ventricle: The right ventricular size is normal. Right vetricular wall thickness was not well visualized. Right ventricular systolic function is normal. There is severely elevated pulmonary artery systolic pressure. The tricuspid regurgitant velocity is 3.69 m/s, and with an assumed right atrial pressure of 8 mmHg, the estimated right ventricular systolic pressure is 18.5 mmHg. Left Atrium: Left atrial size was moderately dilated. Right Atrium: Right atrial size was normal in size. Pericardium: There is no evidence of pericardial effusion. Mitral Valve: The mitral valve was not well visualized. Moderate to severe mitral valve regurgitation. Tricuspid Valve: The tricuspid valve is grossly normal. Tricuspid valve regurgitation is moderate to severe. Aortic Valve: The aortic valve is tricuspid. Aortic valve regurgitation is mild to moderate. Aortic regurgitation PHT measures 543 msec. Aortic valve mean gradient measures 5.0 mmHg. Aortic valve peak gradient measures 10.2 mmHg. Aortic valve area, by VTI measures 0.87 cm. Pulmonic Valve: The pulmonic valve was normal in structure. Pulmonic valve regurgitation is not visualized. Aorta: The aortic root and ascending aorta are structurally normal, with no evidence of dilitation. IAS/Shunts: The atrial septum is grossly normal.  LEFT VENTRICLE PLAX 2D LVIDd:         4.80 cm         Diastology LVIDs:         3.00 cm          LV e' medial:    3.16 cm/s LV PW:         1.20 cm         LV E/e' medial:  26.0 LV IVS:        1.10 cm         LV e' lateral:   7.21 cm/s LVOT diam:     1.70 cm         LV E/e' lateral: 11.4 LV SV:         34 LV SV Index:   20 LVOT Area:     2.27 cm        3D Volume EF                                LV 3D EF:    Left                                             ventricul  ar                                             ejection                                             fraction                                             by 3D                                             volume is                                             47 %.                                 3D Volume EF:                                3D EF:        47 %                                LV EDV:       127 ml                                LV ESV:       68 ml                                LV SV:        59 ml RIGHT VENTRICLE            IVC RV Basal diam:  3.30 cm    IVC diam: 1.90 cm RV S prime:     8.86 cm/s TAPSE (M-mode): 2.1 cm LEFT ATRIUM             Index       RIGHT ATRIUM           Index LA diam:        4.00 cm 2.32 cm/m  RA Area:     14.70 cm LA Vol (A2C):   78.9 ml 45.73 ml/m RA Volume:   34.00 ml  19.71 ml/m LA Vol (A4C):   76.4 ml 44.28 ml/m LA Biplane Vol: 78.3 ml 45.38 ml/m  AORTIC VALVE AV Area (Vmax):    0.91 cm AV Area (Vmean):   0.86 cm AV Area (VTI):     0.87 cm AV Vmax:           160.00  cm/s AV Vmean:          111.000 cm/s AV VTI:            0.396 m AV Peak Grad:      10.2 mmHg AV Mean Grad:      5.0 mmHg LVOT Vmax:         64.40 cm/s LVOT Vmean:        42.200 cm/s LVOT VTI:          0.151 m LVOT/AV VTI ratio: 0.38 AI PHT:            543 msec  AORTA Ao Root diam: 2.50 cm Ao Asc diam:  2.30 cm MITRAL VALVE                 TRICUSPID VALVE MV Area (PHT): 1.68 cm      TR Peak grad:   54.5 mmHg MV Decel Time: 451 msec      TR Vmax:        369.00 cm/s MR Peak grad:    147.4 mmHg  MR Mean grad:    93.0 mmHg   SHUNTS MR Vmax:         607.00 cm/s Systemic VTI:  0.15 m MR Vmean:        457.0 cm/s  Systemic Diam: 1.70 cm MR PISA:         2.26 cm MR PISA Eff ROA: 11 mm MR PISA Radius:  0.60 cm MV E velocity: 82.30 cm/s MV A velocity: 65.80 cm/s MV E/A ratio:  1.25 Mertie Moores MD Electronically signed by Mertie Moores MD Signature Date/Time: 09/12/2020/5:04:59 PM    Final    VAS US CAROTID  Result Date: 09/12/2020 Carotid Arterial Duplex Study Patient Name:  Kristen [name]  Date of Exam:   09/12/2020 Medical Rec #: [id number]       Accession #:    [id number] Date of Birth: 03-04-1945       Patient Gender: F Patient Age:   66 years Exam Location:  [name] [place] Procedure:      VAS US CAROTID Referring Phys: Alferd Patee [facility] --------------------------------------------------------------------------------  Indications:       CVA. Risk Factors:      Hypertension, hyperlipidemia, no history of smoking, prior                    MI. Other Factors:     Afib (on Eliquis), CKD,. Limitations        Today's exam was limited due to patient unable to cooperate                    with proper positioning, tortuous vessels. Comparison Study:  No previous exams Performing Technologist: Jody Hill RVT, RDMS  Examination Guidelines: A complete evaluation includes B-mode imaging, spectral Doppler, color Doppler, and power Doppler as needed of all accessible portions of each vessel. Bilateral testing is considered an integral part of a complete examination. Limited examinations for reoccurring indications may be performed as noted.  Right Carotid Findings: +----------+--------+--------+--------+------------------+------------------+           PSV cm/sEDV cm/sStenosisPlaque DescriptionComments           +----------+--------+--------+--------+------------------+------------------+ CCA Prox  50      4                                 intimal thickening  +----------+--------+--------+--------+------------------+------------------+ CCA Distal45  5                                 intimal thickening +----------+--------+--------+--------+------------------+------------------+ ICA Prox  40      8               calcific and focaltortuous           +----------+--------+--------+--------+------------------+------------------+ ICA Distal55      9                                 tortuous           +----------+--------+--------+--------+------------------+------------------+ ECA       38      0                                                    +----------+--------+--------+--------+------------------+------------------+ +----------+--------+-------+----------------+-------------------+           PSV cm/sEDV cmsDescribe        Arm Pressure (mmHG) +----------+--------+-------+----------------+-------------------+ Subclavian100            Multiphasic, WNL                    +----------+--------+-------+----------------+-------------------+ +---------+--------+--+--------+-+----------------------------+ VertebralPSV cm/s27EDV cm/s3Antegrade and High resistant +---------+--------+--+--------+-+----------------------------+  Left Carotid Findings: +----------+--------+--------+--------+------------------+---------------------+           PSV cm/sEDV cm/sStenosisPlaque DescriptionComments              +----------+--------+--------+--------+------------------+---------------------+ CCA Prox  43      0                                                       +----------+--------+--------+--------+------------------+---------------------+ CCA Distal55      5                                 intimal thickening    +----------+--------+--------+--------+------------------+---------------------+ ICA Prox  74      13                                intimal thickening,                                                        tortuous              +----------+--------+--------+--------+------------------+---------------------+ ICA Distal86      13                                tortuous              +----------+--------+--------+--------+------------------+---------------------+ ECA       31      0                                                       +----------+--------+--------+--------+------------------+---------------------+ +----------+--------+--------+----------------+-------------------+  PSV cm/sEDV cm/sDescribe        Arm Pressure (mmHG) +----------+--------+--------+----------------+-------------------+ [id number]             Multiphasic, WNL                    +----------+--------+--------+----------------+-------------------+ +---------+--------+--+--------+-+----------------------------+ VertebralPSV cm/s25EDV cm/s0Antegrade and High resistant +---------+--------+--+--------+-+----------------------------+   Summary: Right Carotid: The extracranial vessels were near-normal with only minimal wall                thickening or plaque. Left Carotid: The extracranial vessels were near-normal with only minimal wall               thickening or plaque. Vertebrals:  Bilateral vertebral arteries demonstrate antegrade flow. Bilateral              vertebral arteries demonstrate high resistant flow. Subclavians: Normal flow hemodynamics were seen in bilateral subclavian              arteries. *See table(s) above for measurements and observations.  Electronically signed by Deitra Mayo MD on 09/12/2020 at 3:59:56 PM.    Final    CT Maxillofacial Wo Contrast  Result Date: 08/26/2020 CLINICAL DATA:  Polytrauma, critical, head/C-spine injury suspected; Facial trauma. Chronic anticoagulation. EXAM: CT HEAD WITHOUT CONTRAST CT MAXILLOFACIAL WITHOUT CONTRAST CT CERVICAL SPINE WITHOUT CONTRAST TECHNIQUE: Multidetector CT imaging of the head, cervical spine, and maxillofacial  structures were performed using the standard protocol without intravenous contrast. Multiplanar CT image reconstructions of the cervical spine and maxillofacial structures were also generated. COMPARISON:  None. FINDINGS: CT HEAD FINDINGS Brain: Normal anatomic configuration. Parenchymal volume loss is commensurate with the patient's age. Mild periventricular white matter changes are present likely reflecting the sequela of small vessel ischemia. 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 No abnormal mass effect or midline shift. No evidence of acute intracranial hemorrhage or infarct. Ventricular size is normal. Cerebellum unremarkable. Vascular: No asymmetric hyperdense vasculature at the skull base. Skull: Intact Other: Mastoid air cells and middle ear cavities are clear. CT MAXILLOFACIAL FINDINGS Osseous: No fracture or mandibular dislocation. No destructive process. Orbits: Negative. No traumatic or inflammatory finding. Sinuses: Clear. Soft tissues: Negative. CT CERVICAL SPINE FINDINGS Alignment: 1-2 mm anterolisthesis of C4 upon C5 is likely degenerative in nature. Normal cervical lordosis. Skull base and vertebrae: The craniocervical alignment is normal. Atlantal dental interval is not widened. No acute fracture of the cervical spine. Vertebral body height has been preserved. Soft tissues and spinal canal: Moderate central posterior disc herniation at C3-4 abuts the thecal sac without remodeling. Milder posterior disc herniation noted at C2-3. Posterior disc osteophyte complex at C5-6 at C6-7 mildly narrows the spinal canal with mild flattening of the thecal sac. The AP diameter of the spinal canal in this region is, at minimum 6-7 mm in AP diameter. No canal hematoma. The prevertebral soft tissues are not thickened. Nose paraspinal fluid collection identified. Disc levels: There is intervertebral disc space narrowing and  endplate remodeling at E3-X5 in keeping with changes of moderate to severe degenerative disc disease. Degenerative changes are noted at C3-4 and C4-5 with disc osteophytes and posterior disc herniations, as described above. The prevertebral soft tissues are not thickened on sagittal reformats. Review of the axial images demonstrates multilevel uncovertebral arthrosis resulting in moderate bilateral neuroforaminal narrowing on the left at C5-6 and bilaterally at C6-7. Milder neuroforaminal narrowing is noted bilaterally at C4-5 and on the right at  C5-6. Mild neuroforaminal narrowing is also noted on the right at C3-4. Upper chest: Unremarkable Other: None IMPRESSION: No acute intracranial abnormality.  No calvarial fracture. No acute facial fracture. No acute fracture or listhesis of the cervical spine. Electronically Signed   By: Fidela Salisbury MD   On: 08/26/2020 03:02    DISCHARGE EXAMINATION: Vitals:   09/13/20 2033 09/14/20 1522 09/15/20 0300 09/15/20 1105  BP: (!) 159/54 (!) 175/71 (!) 178/60 (!) 170/69  Pulse: (!) 55 (!) 54 (!) 46 (!) 55  Resp: 14 14 15 16   Temp: 98.2 F (36.8 C) 97.6 F (36.4 C) (!) 97.3 F (36.3 C) 97.6 F (36.4 C)  TempSrc: Oral Oral Axillary Axillary  SpO2: 100% 100% 100% 98%  Weight:       Poorly responsive this morning.  No distress. S1-S2 is normal regular Abdomen is soft.   DISPOSITION: [facility]idential hospice  Discharge Instructions     No wound care   Complete by: As directed          Allergies as of 09/15/2020       Reactions   Penicillin G Nausea Only   Has patient had a PCN reaction causing immediate rash, facial/tongue/throat swelling, SOB or lightheadedness with hypotension:No Has patient had a PCN reaction causing severe rash involving mucus membranes or skin necrosis: No Has patient had a PCN reaction that required hospitalization: No Has patient had a PCN reaction occurring within the last 10 years: No--NAUSEA ONLY If all of the above  answers are "NO", then may proceed with Cephalosporin use.        Medication List     STOP taking these medications    apixaban 5 MG Tabs tablet Commonly known as: ELIQUIS   balsalazide 750 MG capsule Commonly known as: COLAZAL   Calcium-Vitamin D3 600-200 MG-UNIT Tabs   divalproex 125 MG capsule Commonly known as: DEPAKOTE SPRINKLE   esomeprazole 20 MG capsule Commonly known as: NEXIUM   flecainide 100 MG tablet Commonly known as: TAMBOCOR   sertraline 50 MG tablet Commonly known as: ZOLOFT   traZODone 50 MG tablet Commonly known as: DESYREL   Venlafaxine HCl 75 MG Tb24   vitamin B-12 1000 MCG tablet Commonly known as: CYANOCOBALAMIN   Vitamin D3 50 MCG (2000 UT) capsule   Zinc Oxide 22 % Crea       TAKE these medications    acetaminophen 500 MG tablet Commonly known as: TYLENOL Take 1,000 mg by mouth every 6 (six) hours as needed for mild pain.   feeding supplement Liqd Take 237 mLs by mouth 2 (two) times daily between meals.   LORazepam 0.5 MG tablet Commonly known as: ATIVAN Take 1 tablet (0.5 mg total) by mouth every 12 (twelve) hours as needed for anxiety.   Metoprolol Succinate 50 MG Cs24 Take 50 mg by mouth daily. Metoprolol Succinate Capsule ER 24 hour sprinkle 105m. HOLD for HR <60bpm.           TOTAL DISCHARGE TIME: 35 minutes  Tracy Kinner [employer] [email or web address]  09/15/2020, 12:46 PM

## 2020-09-15 NOTE — Care Management Important Message (Signed)
Important Message  Patient Details  Name: Kristen [name] MRN: [id number] Date of Birth: [birth year]   Medicare Important Message Given:  Yes     Orbie Pyo 09/15/2020, 4:07 PM

## 2020-09-15 NOTE — Progress Notes (Signed)
  Speech Language Pathology Treatment: Dysphagia  Patient Details Name: Kristen [name] MRN: [id number] DOB: Aug 15, 1945 Today's Date: 09/15/2020 Time: 0850-0905 SLP Time Calculation (min) (ACUTE ONLY): 15 min  Assessment / Plan / Recommendation Clinical Impression  Pt tolerating careful hand feeding of dys 1 and 2 textures and small sips of thin liquids in setting of comfort feeding. Intake is minimal, pt sometimes needs cues to swallow, but seems to still enjoy bits and sips of sweet foods and drinks in minimal quantities. Occasionally coughing with thins noted if sip is not very small with total assisted feeding. Recommend pt continue to be offered sips and bites if alert. Pt was able to say Y/N to food and drink. No SLP f/u needed at this time will sign off.   HPI HPI: 75 y.o. female with medical history significant for dementia, hypertension, hyperlipidemia Crohn's and AFib on eliquis who presented to the ED via EMS from [facility] due to altered mental status.  Patient was unable to provide a history at bedside, she states that she does not know why she was brought to the ED.  History was obtained from ED physician and ED medical record.  Per report, patient was reported to have been confused for about 3 days and that he seems to be leaning more towards the right side.  She was reported to have some confusion at baseline, but she was presumed to have worsened confusion at this time.  There was no report of fever, chills, chest pain, shortness of breath, abdominal pain, nausea or vomiting.  Patient was recently admitted from 7/8-7/13 due to acute metabolic encephalopathy and UTI which was treated with Levaquin x1 and ceftriaxone x2 from 7/9-7/11; CXR on 09/11/20 negative; MRI brain 09/12/20 indicated Punctate foci of acute ischemia in the left frontal lobe and  right parietal lobe. No hemorrhage or mass effect.  2. Moderate chronic small vessel ischemic disease and generalized  volume loss; Pt  made NPO and BSE generated.      SLP Plan  All goals met       Recommendations  Diet recommendations: Dysphagia 2 (fine chop);Thin liquid Liquids provided via: Cup;Teaspoon Medication Administration: Crushed with puree Supervision: Staff to assist with self feeding Compensations: Slow rate;Small sips/bites;Minimize environmental distractions Postural Changes and/or Swallow Maneuvers: Seated upright 90 degrees                Oral Care Recommendations: Oral care BID Follow up Recommendations: Other (comment) SLP Visit Diagnosis: Dysphagia, unspecified (R13.10) Plan: All goals met       GO                Francis Yardley, Katherene Ponto 09/15/2020, 10:00 AM

## 2020-09-16 NOTE — Progress Notes (Signed)
Patient is confused. Unable to complete admission documentation.

## 2020-09-16 NOTE — TOC Transition Note (Signed)
Transition of Care [facility]) - CM/SW Discharge Note   Patient Details  Name: Kristen Gray MRN: [id number] Date of Birth: 1945-04-27  Transition of [facility] CM/SW Contact:  Trula Ore, [place] Phone Number: 09/16/2020, 1:03 PM   Clinical Narrative:     Patient will DC to: [place]  Anticipated DC date: 09/16/2020  Family notified: Mary   Transport by: Corey Harold  ?  Per MD patient ready for DC to [facility] . RN, patient, patient's family, and Bevely Palmer with [facility] notified of DC.  RN given number for report [phone]. DC packet on chart. DNR signed by MD attached to patients DC packet.Ambulance transport requested for patient.  CSW signing off.   Final next level of care: [place] ([place]) Barriers to Discharge: No Barriers Identified   Patient Goals and CMS Choice   CMS Medicare.gov Compare Post Acute Care list provided to:: Patient Represenative (must comment) (patients sister Stanton Kidney) Choice offered to / list presented to : Sibling (Patients sister Stanton Kidney)  Discharge Placement              Patient chooses bed at:  [facility]) Patient to be transferred to facility by: [place] Name of family member notified: Mary Patient and family notified of of transfer: 09/16/20  Discharge Plan and Services                                     Social Determinants of Health (SDOH) Interventions     Readmission Risk Interventions No flowsheet data found.

## 2020-09-16 NOTE — Progress Notes (Signed)
Manufacturing engineer [facility]) Hospital Liaison note.   This patient is approved to transfer to [facility] today.   Please arrange transport.    RN please call report to [phone].   Thank you,     Farrel Gordon, RN, [place] [facility] Liaison  [phone]

## 2020-09-16 NOTE — Progress Notes (Signed)
Manufacturing engineer [facility]) Hospital Liaison note.     This patient is approved to transfer to [facility] today.   [facility] will notify TOC when registration paperwork has been completed to arrange transport.    RN please call report to [phone].   Thank you,     Farrel Gordon, RN, [place] [facility] Liaison  [phone]

## 2020-09-20 DIAGNOSIS — M6281 Muscle weakness (generalized): Secondary | ICD-10-CM | POA: Diagnosis not present

## 2020-09-20 DIAGNOSIS — R296 Repeated falls: Secondary | ICD-10-CM | POA: Diagnosis not present

## 2020-09-24 DEATH — deceased
